# Patient Record
Sex: Male | Born: 1935 | Race: Black or African American | Hispanic: No | Marital: Married | Smoking: Former smoker
Health system: Southern US, Community
[De-identification: ages and names within clinical notes are randomized; demographics above are authoritative.]

## PROBLEM LIST (undated history)

## (undated) DIAGNOSIS — M545 Low back pain, unspecified: Secondary | ICD-10-CM

## (undated) DIAGNOSIS — R55 Syncope and collapse: Secondary | ICD-10-CM

## (undated) DIAGNOSIS — Z860100 Personal history of colon polyps, unspecified: Secondary | ICD-10-CM

## (undated) DIAGNOSIS — I350 Nonrheumatic aortic (valve) stenosis: Secondary | ICD-10-CM

## (undated) DIAGNOSIS — I739 Peripheral vascular disease, unspecified: Secondary | ICD-10-CM

## (undated) DIAGNOSIS — G629 Polyneuropathy, unspecified: Secondary | ICD-10-CM

## (undated) DIAGNOSIS — E875 Hyperkalemia: Secondary | ICD-10-CM

## (undated) DIAGNOSIS — G8929 Other chronic pain: Secondary | ICD-10-CM

## (undated) DIAGNOSIS — I509 Heart failure, unspecified: Secondary | ICD-10-CM

## (undated) DIAGNOSIS — L723 Sebaceous cyst: Secondary | ICD-10-CM

## (undated) DIAGNOSIS — I10 Essential (primary) hypertension: Secondary | ICD-10-CM

## (undated) DIAGNOSIS — N289 Disorder of kidney and ureter, unspecified: Secondary | ICD-10-CM

## (undated) DIAGNOSIS — I872 Venous insufficiency (chronic) (peripheral): Secondary | ICD-10-CM

## (undated) DIAGNOSIS — D649 Anemia, unspecified: Secondary | ICD-10-CM

## (undated) DIAGNOSIS — C61 Malignant neoplasm of prostate: Secondary | ICD-10-CM

## (undated) DIAGNOSIS — K5730 Diverticulosis of large intestine without perforation or abscess without bleeding: Secondary | ICD-10-CM

## (undated) DIAGNOSIS — Z87898 Personal history of other specified conditions: Secondary | ICD-10-CM

## (undated) HISTORY — DX: Peripheral vascular disease, unspecified: I73.9

## (undated) HISTORY — DX: Sebaceous cyst: L72.3

## (undated) HISTORY — DX: Malignant neoplasm of prostate: C61

## (undated) HISTORY — DX: Anemia, unspecified: D64.9

## (undated) HISTORY — PX: APPENDECTOMY: SHX54

## (undated) HISTORY — DX: Other chronic pain: G89.29

## (undated) HISTORY — DX: Heart failure, unspecified: I50.9

## (undated) HISTORY — DX: Disorder of kidney and ureter, unspecified: N28.9

## (undated) HISTORY — DX: Syncope and collapse: R55

## (undated) HISTORY — DX: Essential (primary) hypertension: I10

## (undated) HISTORY — DX: Nonrheumatic aortic (valve) stenosis: I35.0

## (undated) HISTORY — DX: Diverticulosis of large intestine without perforation or abscess without bleeding: K57.30

## (undated) HISTORY — DX: Low back pain: M54.5

## (undated) HISTORY — DX: Venous insufficiency (chronic) (peripheral): I87.2

## (undated) HISTORY — DX: Personal history of other specified conditions: Z87.898

## (undated) HISTORY — DX: Personal history of colon polyps, unspecified: Z86.0100

## (undated) HISTORY — DX: Personal history of colonic polyps: Z86.010

## (undated) HISTORY — DX: Low back pain, unspecified: M54.50

## (undated) HISTORY — DX: Polyneuropathy, unspecified: G62.9

---

## 1999-11-13 ENCOUNTER — Inpatient Hospital Stay (HOSPITAL_COMMUNITY): Admission: EM | Admit: 1999-11-13 | Discharge: 1999-11-15 | Payer: Self-pay | Admitting: Cardiology

## 1999-11-13 ENCOUNTER — Encounter: Payer: Self-pay | Admitting: Internal Medicine

## 1999-11-15 ENCOUNTER — Encounter: Payer: Self-pay | Admitting: Cardiology

## 2004-05-28 ENCOUNTER — Ambulatory Visit: Payer: Self-pay | Admitting: Pulmonary Disease

## 2004-12-05 ENCOUNTER — Ambulatory Visit: Payer: Self-pay | Admitting: Pulmonary Disease

## 2005-05-20 ENCOUNTER — Ambulatory Visit: Payer: Self-pay | Admitting: Pulmonary Disease

## 2005-05-21 ENCOUNTER — Ambulatory Visit: Payer: Self-pay | Admitting: Pulmonary Disease

## 2005-06-28 ENCOUNTER — Emergency Department (HOSPITAL_COMMUNITY): Admission: EM | Admit: 2005-06-28 | Discharge: 2005-06-28 | Payer: Self-pay | Admitting: Emergency Medicine

## 2005-07-15 ENCOUNTER — Ambulatory Visit: Payer: Self-pay | Admitting: Pulmonary Disease

## 2005-07-30 ENCOUNTER — Ambulatory Visit: Payer: Self-pay | Admitting: Pulmonary Disease

## 2005-11-17 ENCOUNTER — Ambulatory Visit: Payer: Self-pay | Admitting: Pulmonary Disease

## 2006-05-17 ENCOUNTER — Ambulatory Visit: Payer: Self-pay | Admitting: Pulmonary Disease

## 2006-05-18 ENCOUNTER — Ambulatory Visit: Payer: Self-pay | Admitting: Pulmonary Disease

## 2006-05-18 LAB — CONVERTED CEMR LAB
ALT: 15 units/L (ref 0–40)
AST: 19 units/L (ref 0–37)
Albumin: 3.6 g/dL (ref 3.5–5.2)
Alkaline Phosphatase: 61 units/L (ref 39–117)
BUN: 18 mg/dL (ref 6–23)
Basophils Absolute: 0 10*3/uL (ref 0.0–0.1)
Basophils Relative: 0.5 % (ref 0.0–1.0)
CO2: 31 meq/L (ref 19–32)
Calcium: 9.4 mg/dL (ref 8.4–10.5)
Chloride: 108 meq/L (ref 96–112)
Chol/HDL Ratio, serum: 3.1
Cholesterol: 171 mg/dL (ref 0–200)
Creatinine, Ser: 1.6 mg/dL — ABNORMAL HIGH (ref 0.4–1.5)
Eosinophil percent: 0.8 % (ref 0.0–5.0)
GFR calc non Af Amer: 44 mL/min
Glomerular Filtration Rate, Af Am: 53 mL/min/{1.73_m2}
Glucose, Bld: 115 mg/dL — ABNORMAL HIGH (ref 70–99)
HCT: 37.7 % — ABNORMAL LOW (ref 39.0–52.0)
HDL: 54.5 mg/dL (ref >39.0)
Hemoglobin: 12.5 g/dL — ABNORMAL LOW (ref 13.0–17.0)
Hgb A1c MFr Bld: 7.2 % — ABNORMAL HIGH (ref 4.6–6.0)
LDL Cholesterol: 109 mg/dL — ABNORMAL HIGH (ref 0–99)
Lymphocytes Relative: 24 % (ref 12.0–46.0)
MCHC: 33.1 g/dL (ref 30.0–36.0)
MCV: 85.6 fL (ref 78.0–100.0)
Monocytes Absolute: 0.5 10*3/uL (ref 0.2–0.7)
Monocytes Relative: 11.3 % — ABNORMAL HIGH (ref 3.0–11.0)
Neutro Abs: 3.1 10*3/uL (ref 1.4–7.7)
Neutrophils Relative %: 63.4 % (ref 43.0–77.0)
PSA: 1.9 ng/mL (ref 0.10–4.00)
Platelets: 232 10*3/uL (ref 150–400)
Potassium: 5 meq/L (ref 3.5–5.1)
RBC: 4.4 M/uL (ref 4.22–5.81)
RDW: 14.6 % (ref 11.5–14.6)
Sodium: 144 meq/L (ref 135–145)
TSH: 1.12 microintl units/mL (ref 0.35–5.50)
Total Bilirubin: 0.5 mg/dL (ref 0.3–1.2)
Total Protein: 7.1 g/dL (ref 6.0–8.3)
Triglyceride fasting, serum: 40 mg/dL (ref 0–149)
VLDL: 8 mg/dL (ref 0–40)
WBC: 4.7 10*3/uL (ref 4.5–10.5)

## 2006-11-09 ENCOUNTER — Ambulatory Visit: Payer: Self-pay | Admitting: Pulmonary Disease

## 2006-11-09 LAB — CONVERTED CEMR LAB
Albumin: 3.4 g/dL — ABNORMAL LOW (ref 3.5–5.2)
BUN: 18 mg/dL (ref 6–23)
Chloride: 108 meq/L (ref 96–112)
Creatinine, Ser: 1.4 mg/dL (ref 0.4–1.5)
GFR calc Af Amer: 62 mL/min
GFR calc non Af Amer: 51 mL/min
Hgb A1c MFr Bld: 7.4 % — ABNORMAL HIGH (ref 4.6–6.0)

## 2007-05-11 ENCOUNTER — Ambulatory Visit: Payer: Self-pay | Admitting: Pulmonary Disease

## 2007-05-11 LAB — CONVERTED CEMR LAB
ALT: 15 units/L (ref 0–53)
AST: 20 units/L (ref 0–37)
Albumin: 3.8 g/dL (ref 3.5–5.2)
Alkaline Phosphatase: 68 units/L (ref 39–117)
BUN: 25 mg/dL — ABNORMAL HIGH (ref 6–23)
Basophils Absolute: 0 10*3/uL (ref 0.0–0.1)
Basophils Relative: 0 % (ref 0.0–1.0)
CO2: 31 meq/L (ref 19–32)
Chloride: 105 meq/L (ref 96–112)
Creatinine, Ser: 1.7 mg/dL — ABNORMAL HIGH (ref 0.4–1.5)
HCT: 35.4 % — ABNORMAL LOW (ref 39.0–52.0)
Hemoglobin: 11.9 g/dL — ABNORMAL LOW (ref 13.0–17.0)
MCHC: 33.5 g/dL (ref 30.0–36.0)
Monocytes Absolute: 0.4 10*3/uL (ref 0.2–0.7)
Monocytes Relative: 8.6 % (ref 3.0–11.0)
Neutrophils Relative %: 66.1 % (ref 43.0–77.0)
Potassium: 5 meq/L (ref 3.5–5.1)
RBC: 4.17 M/uL — ABNORMAL LOW (ref 4.22–5.81)
RDW: 15.3 % — ABNORMAL HIGH (ref 11.5–14.6)
TSH: 0.89 microintl units/mL (ref 0.35–5.50)
Total Bilirubin: 0.6 mg/dL (ref 0.3–1.2)
Total Protein: 7 g/dL (ref 6.0–8.3)

## 2007-05-17 DIAGNOSIS — M545 Low back pain: Secondary | ICD-10-CM

## 2007-05-17 DIAGNOSIS — D510 Vitamin B12 deficiency anemia due to intrinsic factor deficiency: Secondary | ICD-10-CM

## 2007-05-17 DIAGNOSIS — I10 Essential (primary) hypertension: Secondary | ICD-10-CM | POA: Insufficient documentation

## 2007-05-17 DIAGNOSIS — Z8669 Personal history of other diseases of the nervous system and sense organs: Secondary | ICD-10-CM | POA: Insufficient documentation

## 2007-06-10 ENCOUNTER — Telehealth (INDEPENDENT_AMBULATORY_CARE_PROVIDER_SITE_OTHER): Payer: Self-pay | Admitting: *Deleted

## 2007-11-09 ENCOUNTER — Ambulatory Visit: Payer: Self-pay | Admitting: Pulmonary Disease

## 2007-11-09 DIAGNOSIS — I872 Venous insufficiency (chronic) (peripheral): Secondary | ICD-10-CM | POA: Insufficient documentation

## 2007-11-09 DIAGNOSIS — E118 Type 2 diabetes mellitus with unspecified complications: Secondary | ICD-10-CM

## 2007-11-09 DIAGNOSIS — I739 Peripheral vascular disease, unspecified: Secondary | ICD-10-CM

## 2007-11-09 DIAGNOSIS — G609 Hereditary and idiopathic neuropathy, unspecified: Secondary | ICD-10-CM

## 2007-11-09 DIAGNOSIS — C61 Malignant neoplasm of prostate: Secondary | ICD-10-CM

## 2007-11-09 DIAGNOSIS — K5730 Diverticulosis of large intestine without perforation or abscess without bleeding: Secondary | ICD-10-CM | POA: Insufficient documentation

## 2007-11-09 DIAGNOSIS — I359 Nonrheumatic aortic valve disorder, unspecified: Secondary | ICD-10-CM

## 2007-11-09 DIAGNOSIS — D126 Benign neoplasm of colon, unspecified: Secondary | ICD-10-CM | POA: Insufficient documentation

## 2007-11-14 LAB — CONVERTED CEMR LAB
Alkaline Phosphatase: 69 units/L (ref 39–117)
Basophils Absolute: 0 10*3/uL (ref 0.0–0.1)
Bilirubin, Direct: 0.1 mg/dL (ref 0.0–0.3)
Calcium: 9 mg/dL (ref 8.4–10.5)
Eosinophils Absolute: 0.1 10*3/uL (ref 0.0–0.7)
GFR calc Af Amer: 46 mL/min
GFR calc non Af Amer: 38 mL/min
Glucose, Bld: 142 mg/dL — ABNORMAL HIGH (ref 70–99)
HCT: 33.9 % — ABNORMAL LOW (ref 39.0–52.0)
Hemoglobin: 11.1 g/dL — ABNORMAL LOW (ref 13.0–17.0)
Hgb A1c MFr Bld: 7 % — ABNORMAL HIGH (ref 4.6–6.0)
MCHC: 32.6 g/dL (ref 30.0–36.0)
MCV: 86.2 fL (ref 78.0–100.0)
Microalb Creat Ratio: 82 mg/g — ABNORMAL HIGH (ref 0.0–30.0)
Microalb, Ur: 15.3 mg/dL — ABNORMAL HIGH (ref 0.0–1.9)
Monocytes Absolute: 0.4 10*3/uL (ref 0.1–1.0)
Monocytes Relative: 8.9 % (ref 3.0–12.0)
Neutro Abs: 3.3 10*3/uL (ref 1.4–7.7)
Platelets: 225 10*3/uL (ref 150–400)
Potassium: 5 meq/L (ref 3.5–5.1)
RDW: 14.7 % — ABNORMAL HIGH (ref 11.5–14.6)
Sodium: 144 meq/L (ref 135–145)
TSH: 0.89 microintl units/mL (ref 0.35–5.50)
Total Protein: 6.7 g/dL (ref 6.0–8.3)

## 2007-11-15 ENCOUNTER — Ambulatory Visit: Payer: Self-pay

## 2007-11-15 ENCOUNTER — Encounter: Payer: Self-pay | Admitting: Pulmonary Disease

## 2008-03-19 ENCOUNTER — Encounter: Payer: Self-pay | Admitting: Pulmonary Disease

## 2008-03-20 ENCOUNTER — Telehealth (INDEPENDENT_AMBULATORY_CARE_PROVIDER_SITE_OTHER): Payer: Self-pay | Admitting: *Deleted

## 2008-05-07 ENCOUNTER — Ambulatory Visit: Payer: Self-pay | Admitting: Pulmonary Disease

## 2008-05-19 DIAGNOSIS — N259 Disorder resulting from impaired renal tubular function, unspecified: Secondary | ICD-10-CM | POA: Insufficient documentation

## 2008-05-19 LAB — CONVERTED CEMR LAB
Alkaline Phosphatase: 68 units/L (ref 39–117)
Bilirubin, Direct: 0.1 mg/dL (ref 0.0–0.3)
Chloride: 107 meq/L (ref 96–112)
Eosinophils Absolute: 0 10*3/uL (ref 0.0–0.7)
GFR calc Af Amer: 41 mL/min
GFR calc non Af Amer: 34 mL/min
HCT: 32.4 % — ABNORMAL LOW (ref 39.0–52.0)
MCV: 86.2 fL (ref 78.0–100.0)
Monocytes Absolute: 0.4 10*3/uL (ref 0.1–1.0)
Monocytes Relative: 9.9 % (ref 3.0–12.0)
Neutrophils Relative %: 69.4 % (ref 43.0–77.0)
Platelets: 203 10*3/uL (ref 150–400)
Potassium: 4.9 meq/L (ref 3.5–5.1)
RDW: 15.5 % — ABNORMAL HIGH (ref 11.5–14.6)
Sodium: 144 meq/L (ref 135–145)

## 2008-08-30 ENCOUNTER — Telehealth (INDEPENDENT_AMBULATORY_CARE_PROVIDER_SITE_OTHER): Payer: Self-pay | Admitting: *Deleted

## 2008-09-04 ENCOUNTER — Telehealth (INDEPENDENT_AMBULATORY_CARE_PROVIDER_SITE_OTHER): Payer: Self-pay | Admitting: *Deleted

## 2008-09-28 ENCOUNTER — Telehealth (INDEPENDENT_AMBULATORY_CARE_PROVIDER_SITE_OTHER): Payer: Self-pay

## 2008-11-06 ENCOUNTER — Ambulatory Visit: Payer: Self-pay | Admitting: Pulmonary Disease

## 2008-11-07 ENCOUNTER — Telehealth (INDEPENDENT_AMBULATORY_CARE_PROVIDER_SITE_OTHER): Payer: Self-pay | Admitting: *Deleted

## 2008-11-14 DIAGNOSIS — K029 Dental caries, unspecified: Secondary | ICD-10-CM | POA: Insufficient documentation

## 2008-11-14 LAB — CONVERTED CEMR LAB
ALT: 14 units/L (ref 0–53)
BUN: 27 mg/dL — ABNORMAL HIGH (ref 6–23)
CO2: 30 meq/L (ref 19–32)
Chloride: 110 meq/L (ref 96–112)
Eosinophils Relative: 2.3 % (ref 0.0–5.0)
Glucose, Bld: 100 mg/dL — ABNORMAL HIGH (ref 70–99)
HCT: 32.7 % — ABNORMAL LOW (ref 39.0–52.0)
Hgb A1c MFr Bld: 6.7 % — ABNORMAL HIGH (ref 4.6–6.5)
Lymphs Abs: 1.1 10*3/uL (ref 0.7–4.0)
MCHC: 34.5 g/dL (ref 30.0–36.0)
MCV: 85.7 fL (ref 78.0–100.0)
Monocytes Absolute: 0.5 10*3/uL (ref 0.1–1.0)
Platelets: 186 10*3/uL (ref 150.0–400.0)
Potassium: 5.6 meq/L — ABNORMAL HIGH (ref 3.5–5.1)
RDW: 15.1 % — ABNORMAL HIGH (ref 11.5–14.6)
TSH: 1.05 microintl units/mL (ref 0.35–5.50)
Total Bilirubin: 0.9 mg/dL (ref 0.3–1.2)
WBC: 3.9 10*3/uL — ABNORMAL LOW (ref 4.5–10.5)

## 2008-11-19 ENCOUNTER — Telehealth: Payer: Self-pay | Admitting: Pulmonary Disease

## 2008-12-13 ENCOUNTER — Encounter: Payer: Self-pay | Admitting: Pulmonary Disease

## 2008-12-18 ENCOUNTER — Encounter: Payer: Self-pay | Admitting: Pulmonary Disease

## 2009-05-06 ENCOUNTER — Ambulatory Visit: Payer: Self-pay | Admitting: Pulmonary Disease

## 2009-05-06 LAB — CONVERTED CEMR LAB
BUN: 29 mg/dL — ABNORMAL HIGH (ref 6–23)
Creatinine, Ser: 1.5 mg/dL (ref 0.4–1.5)
Eosinophils Relative: 1.2 % (ref 0.0–5.0)
GFR calc non Af Amer: 56.46 mL/min (ref >60)
HCT: 32.2 % — ABNORMAL LOW (ref 39.0–52.0)
Hgb A1c MFr Bld: 6.3 % (ref 4.6–6.5)
Lymphocytes Relative: 23.3 % (ref 12.0–46.0)
Monocytes Relative: 10.2 % (ref 3.0–12.0)
Neutrophils Relative %: 61.9 % (ref 43.0–77.0)
Platelets: 231 10*3/uL (ref 150.0–400.0)
Potassium: 5.6 meq/L — ABNORMAL HIGH (ref 3.5–5.1)
WBC: 4.4 10*3/uL — ABNORMAL LOW (ref 4.5–10.5)

## 2009-06-18 ENCOUNTER — Ambulatory Visit: Payer: Self-pay | Admitting: Pulmonary Disease

## 2009-06-18 DIAGNOSIS — M79609 Pain in unspecified limb: Secondary | ICD-10-CM

## 2009-06-19 LAB — CONVERTED CEMR LAB: Uric Acid, Serum: 6.2 mg/dL (ref 4.0–7.8)

## 2009-06-25 ENCOUNTER — Telehealth (INDEPENDENT_AMBULATORY_CARE_PROVIDER_SITE_OTHER): Payer: Self-pay | Admitting: *Deleted

## 2009-06-28 ENCOUNTER — Telehealth (INDEPENDENT_AMBULATORY_CARE_PROVIDER_SITE_OTHER): Payer: Self-pay | Admitting: *Deleted

## 2009-07-04 ENCOUNTER — Ambulatory Visit: Payer: Self-pay | Admitting: Pulmonary Disease

## 2009-09-10 ENCOUNTER — Encounter: Payer: Self-pay | Admitting: Adult Health

## 2009-09-10 ENCOUNTER — Ambulatory Visit: Payer: Self-pay | Admitting: Pulmonary Disease

## 2009-09-24 ENCOUNTER — Ambulatory Visit: Payer: Self-pay | Admitting: Pulmonary Disease

## 2009-09-25 DIAGNOSIS — L97509 Non-pressure chronic ulcer of other part of unspecified foot with unspecified severity: Secondary | ICD-10-CM

## 2009-09-30 ENCOUNTER — Encounter (HOSPITAL_BASED_OUTPATIENT_CLINIC_OR_DEPARTMENT_OTHER)
Admission: RE | Admit: 2009-09-30 | Discharge: 2009-11-06 | Payer: Self-pay | Source: Home / Self Care | Admitting: General Surgery

## 2009-10-02 ENCOUNTER — Ambulatory Visit (HOSPITAL_COMMUNITY): Admission: RE | Admit: 2009-10-02 | Discharge: 2009-10-02 | Payer: Self-pay | Admitting: General Surgery

## 2009-10-29 ENCOUNTER — Encounter: Payer: Self-pay | Admitting: Pulmonary Disease

## 2009-11-06 ENCOUNTER — Ambulatory Visit: Payer: Self-pay | Admitting: Pulmonary Disease

## 2009-11-07 ENCOUNTER — Encounter: Payer: Self-pay | Admitting: Pulmonary Disease

## 2009-11-09 LAB — CONVERTED CEMR LAB
ALT: 13 units/L (ref 0–53)
AST: 17 units/L (ref 0–37)
Basophils Relative: 0.4 % (ref 0.0–3.0)
Bilirubin, Direct: 0.1 mg/dL (ref 0.0–0.3)
Chloride: 105 meq/L (ref 96–112)
Eosinophils Absolute: 0 10*3/uL (ref 0.0–0.7)
HCT: 31.1 % — ABNORMAL LOW (ref 39.0–52.0)
Hgb A1c MFr Bld: 6.8 % — ABNORMAL HIGH (ref 4.6–6.5)
Lymphs Abs: 0.9 10*3/uL (ref 0.7–4.0)
MCHC: 33.5 g/dL (ref 30.0–36.0)
MCV: 83.3 fL (ref 78.0–100.0)
Monocytes Absolute: 0.4 10*3/uL (ref 0.1–1.0)
Neutrophils Relative %: 63.6 % (ref 43.0–77.0)
Platelets: 214 10*3/uL (ref 150.0–400.0)
Potassium: 5.1 meq/L (ref 3.5–5.1)
RBC: 3.74 M/uL — ABNORMAL LOW (ref 4.22–5.81)
Sodium: 145 meq/L (ref 135–145)
Total Bilirubin: 0.6 mg/dL (ref 0.3–1.2)

## 2010-03-06 ENCOUNTER — Telehealth: Payer: Self-pay | Admitting: Pulmonary Disease

## 2010-03-31 ENCOUNTER — Ambulatory Visit: Payer: Self-pay | Admitting: Pulmonary Disease

## 2010-03-31 DIAGNOSIS — R634 Abnormal weight loss: Secondary | ICD-10-CM

## 2010-04-02 ENCOUNTER — Ambulatory Visit (HOSPITAL_COMMUNITY)
Admission: RE | Admit: 2010-04-02 | Discharge: 2010-04-02 | Payer: Self-pay | Source: Home / Self Care | Admitting: Pulmonary Disease

## 2010-04-05 LAB — CONVERTED CEMR LAB
Albumin: 3.5 g/dL (ref 3.5–5.2)
BUN: 26 mg/dL — ABNORMAL HIGH (ref 6–23)
Basophils Relative: 0.6 % (ref 0.0–3.0)
Bilirubin Urine: NEGATIVE
Bilirubin, Direct: 0.1 mg/dL (ref 0.0–0.3)
CEA: 1.2 ng/mL (ref 0.0–5.0)
CO2: 31 meq/L (ref 19–32)
Calcium: 9 mg/dL (ref 8.4–10.5)
Creatinine, Ser: 1.5 mg/dL (ref 0.4–1.5)
Eosinophils Relative: 1.1 % (ref 0.0–5.0)
Hemoglobin, Urine: NEGATIVE
Hemoglobin: 9.8 g/dL — ABNORMAL LOW (ref 13.0–17.0)
Hgb A1c MFr Bld: 7 % — ABNORMAL HIGH (ref 4.6–6.5)
Leukocytes, UA: NEGATIVE
Lymphocytes Relative: 18.8 % (ref 12.0–46.0)
MCHC: 33.2 g/dL (ref 30.0–36.0)
Monocytes Relative: 10.2 % (ref 3.0–12.0)
Neutro Abs: 3.7 10*3/uL (ref 1.4–7.7)
Neutrophils Relative %: 69.3 % (ref 43.0–77.0)
PSA: 1.9 ng/mL (ref 0.10–4.00)
RBC: 3.38 M/uL — ABNORMAL LOW (ref 4.22–5.81)
Sed Rate: 29 mm/hr — ABNORMAL HIGH (ref 0–22)
Total Protein: 6.3 g/dL (ref 6.0–8.3)
WBC: 5.3 10*3/uL (ref 4.5–10.5)

## 2010-04-08 ENCOUNTER — Encounter: Payer: Self-pay | Admitting: Pulmonary Disease

## 2010-04-10 ENCOUNTER — Ambulatory Visit: Payer: Self-pay | Admitting: Pulmonary Disease

## 2010-04-13 LAB — CONVERTED CEMR LAB
OCCULT 2: NEGATIVE
OCCULT 3: NEGATIVE

## 2010-07-30 ENCOUNTER — Ambulatory Visit
Admission: RE | Admit: 2010-07-30 | Discharge: 2010-07-30 | Payer: Self-pay | Source: Home / Self Care | Attending: Pulmonary Disease | Admitting: Pulmonary Disease

## 2010-08-12 NOTE — Assessment & Plan Note (Signed)
Summary: Shane [name]   CC:  4 month ROV & review of mult medical problems....  History of Present Illness: 77 y/o BM here for a follow up visit... he has mult med problems as noted below...   ~  Apr10:  he has no new complaints or concerns... he still exercises by picking up old bottles and cans in the neighborhood "for gas money"...    ~  May 06, 2009:  he's had a good 6 months- no new problems... BP well controlled, no CP/ palpit/ dizzy/ etc... watches his BS and it's been doing well w/ range 100-140... he saw DrPeterson for f/u prostate ca about 3-4 months ago & stable by his report... OK Flu shot today...   ~  July 04, 2009:  he developed a blister on the dorsum of his left foot- ?no known trauma, ?pressure from shoe... treated w/ Keflex & topical Rx but remained red/ swollen... given Doxy x10d and improved... in the meanwhile he saw DrDuda, Ortho w/ nails trimmed and using "bag balm" for dry skin...       *** exam shows tinea pedis & clean eschar on dorsum- we will Rx w/ LotriminAF cream...   ~  November 06, 2009:  he went to wound care clinic and lesion on top of left foot resolved... still has some tinea pedis & we reviewed the treatment & refilled the LotriminAF cream...  BS has been well controlled on Metformin;  BP stable on meds and mild renal insuffic w/o change;  same of ACD...  only c/o some constipation & we discussed Rx w/ MIRALAX + SENAKOT-S...    Current Problem List:  DENTAL CARIES (ICD-521.00) - this needs attention from his dentist & he promises to see him soon...   HYPERTENSION (ICD-401.9) - on PROCARDIA XL 30mg /d & COZAAR 25mg /d... BP= 134/72, feeling well and tol meds well too... denies HA, fatigue, visual changes, CP, palipit, dizziness, syncope, dyspnea, edema, etc...  ~  cath 1996 w. norm coronaries... & Cardiolite 2001 was neg...  ~  CXR 4/10 showed norm heart, clear lungs...  AORTIC STENOSIS (ICD-424.1) - on above meds + IMDUR 30mg /d... denies CP, palpit, dizzy  or syncope (he was hosp in 2001 for syncope)... he walks regularly and picks up cans for recycle "It's bread and gas money"... he knows to stay well hydrated.  ~  2DEcho 4/05 showed mod Ao sclerosis & mild Ao root dil, mod asymmetric LVH...   ~  2DEcho 5/09 showed mod incr LVwall thickness w/ focal basal septal hypertropy & end-sys mid cavity oblit, mod calcif AoV c/w mild AS, mild asc Ao dil...  PERIPHERAL VASCULAR DISEASE (ICD-443.9) - on ASA 81mg /d... ArtDopplers of LE's in 2001 w/ calcif vessels and decr toe pressures but norm ABI's... he knows to avoid trauma, inspect feet regularly & watch for pressure sores etc... he sees DrDuda for foot care.  ~  3/11:  sm lesion dorsum left foot> sent to wound care clinic & slowly resolved...  VENOUS INSUFFICIENCY (ICD-459.81) - no edema...  DIABETES MELLITUS (ICD-250.00) - on METFORMIN 500mg Bid...  ~  labs 10/08 showed BS=107, HgA1c=6.8.[name]. note BUN=25, Creat=1.7.Marland KitchenMarland Kitchen no DM retinopathy per ophthal.  ~  labs 4/09 showed BS= 142, HgA1c= 7.0.[name]. he didn't want to add more meds.  ~  labs 10/09 showed BS= 118, HgA1c= 6.4  ~  labs 4/10 showed BS= 100, HgA1c= 6.7  ~  labs 10/10 showed BS= 121, A1c= 6.3  ~  labs 4/11 showed BS= 95, A1c= 6.8  DIVERTICULOSIS  OF COLON (ICD-562.10) - he notes occas constip & we discussed Miralax/ Senakot-S. COLONIC POLYPS (ICD-211.3) - last colonoscopy 9/99 by DrPatterson was neg without recurrent colon polyps, mild divertics only...  RENAL INSUFFICIENCY (ICD-588.9) - he has mild renal insuffic w/ BUN= 25, Creat= 1.7 on labs 10/08.  ~  labs 4/09 showed BUN= 23, Creat= 1.8  ~  labs 10/09 showed BUN= 30, Creat= 2.0  ~  labs 4/10 showed BUN= 27, Creat= 1.6  ~  labs 10/10 showed BUN= 29, Creat= 1.5  ~  labs 4/11 showed BUN= 20, Creat= 1.7  ADENOCARCINOMA, PROSTATE (ICD-185) - on FLOMAX 0.4mg /d from DrPeterson...   ~  10/10:  pt reports being seen by DrPeterson 3-4 mo ago & doing well by his report.  Hx of LOW BACK PAIN, CHRONIC  (ICD-724.2) - s/p eval from DrDuda for ortho...  SYNCOPE, HX OF (ICD-V12.49)  PERIPHERAL NEUROPATHY (ICD-356.9) - on NEURONTIN 300mg Bid...  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  Past History:  Past Medical History: DENTAL CARIES (ICD-521.00) HYPERTENSION (ICD-401.9) AORTIC STENOSIS (ICD-424.1) PERIPHERAL VASCULAR DISEASE (ICD-443.9) VENOUS INSUFFICIENCY (ICD-459.81) DIABETES MELLITUS (ICD-250.00) DIVERTICULOSIS OF COLON (ICD-562.10) COLONIC POLYPS (ICD-211.3) RENAL INSUFFICIENCY (ICD-588.9) ADENOCARCINOMA, PROSTATE (ICD-185) Hx of LOW BACK PAIN, CHRONIC (ICD-724.2) SYNCOPE, HX OF (ICD-V12.49) PERIPHERAL NEUROPATHY (ICD-356.9) SEBACEOUS CYST (ICD-706.2) ANEMIA (ICD-285.9)  Family History: Reviewed history from 06/18/2009 and no changes required. heart disease - mother otherwise noncontributory  Social History: Reviewed history from 06/18/2009 and no changes required. quit smoking cigars in 1980's x2-70yrs. noi alcohol married 1 children worked for [employer].  Review of Systems      See HPI       The patient complains of decreased hearing, dyspnea on exertion, and difficulty walking.  The patient denies anorexia, fever, weight loss, weight gain, vision loss, hoarseness, chest pain, syncope, peripheral edema, prolonged cough, headaches, hemoptysis, abdominal pain, melena, hematochezia, severe indigestion/heartburn, hematuria, incontinence, muscle weakness, suspicious skin lesions, transient blindness, depression, unusual weight change,  abnormal bleeding, enlarged lymph nodes, and angioedema.    Vital Signs:  Patient profile:   77 year old male Height:      67 inches Weight:      136 pounds O2 Sat:      100 % on Room air Temp:     98.7 degrees F oral Pulse rate:   57 / minute BP sitting:   134 / 72  (left arm) Cuff size:   regular  Vitals Entered By: Randell Loop CMA (November 06, 2009 2:32 PM)  O2 Sat at Rest %:  100 O2 Flow:  Room air CC: 4 month ROV & review of mult medical problems... Is Patient Diabetic? Yes Pain Assessment Patient in pain? no      Comments no changes in meds today--pt brought all of his meds today   Physical Exam  Additional Exam:  WD, WN, 77 y/o BM in NAD... GENERAL:  Alert & oriented; pleasant & cooperative. HEENT:  [place]/AT, EOM-wnl, PERRLA, EACs-clear, TMs-wnl, NOSE-clear, THROAT-clear & wnl; severe  dental caries... NECK:  Supple w/ decrROM; no JVD; normal carotid impulses w/o bruits; no thyromegaly or nodules palpated; no lymphadenopathy. CHEST:  Clear to P & A; without wheezes/ rales/ or rhonchi. HEART:  Regular Rhythm;  gr 2/6 SEM without rubs or gallops heard... ABDOMEN:  Soft & nontender; normal bowel sounds; no organomegaly or masses detected. EXT: without deformities, mod arthritic changes (esp neck) no varicose veins/ +venous insuffic/ no edema. Scar on dorsum left foot, no open area, no drainage, etc... NEURO:  CN's intact;  no focal neuro deficits... touch sensation intact in feet. DERM:  Tinea pedis + dry skin LE's...    MISC. Report  Procedure date:  11/06/2009  Findings:      BMP (METABOL)   Sodium                    145 mEq/L                   135-145   Potassium                 5.1 mEq/L                   3.5-5.1   Chloride                  105 mEq/L                   96-112   Carbon Dioxide       [H]  33 mEq/L                    19-32   Glucose                   95 mg/dL                    78-46   BUN                       20 mg/dL                    9-62    Creatinine           [H]  1.7 mg/dL                   9.5-2.8   Calcium                   9.1 mg/dL                   4.1-32.4   GFR                       48.81 mL/min                >60   Hepatic/Liver Function Panel (HEPATIC)   Total Bilirubin           0.6 mg/dL                   4.0-1.0   Direct Bilirubin          0.1 mg/dL                   2.7-2.5   Alkaline Phosphatase      78 U/L  39-117   AST                       17 U/L                      0-37   ALT                       13 U/L                      0-53   Total Protein             6.2 g/dL                    1.6-1.0   Albumin                   3.5 g/dL                    9.6-0.4   CBC Platelet w/Diff (CBCD)   White Cell Count     [L]  3.7 K/uL                    4.5-10.5   Red Cell Count       [L]  3.74 Mil/uL                 4.22-5.81   Hemoglobin           [L]  10.4 g/dL                   54.0-98.1   Hematocrit           [L]  31.1 %                      39.0-52.0   MCV                       83.3 fl                     78.0-100.0   Platelet Count            214.0 K/uL                  150.0-400.0   Neutrophil %              63.6 %                      43.0-77.0   Lymphocyte %              24.1 %                      12.0-46.0   Monocyte %                11.0 %                      3.0-12.0   Eosinophils%              0.9 %                       0.0-5.0   Basophils %               0.4 %  0.0-3.0  Comments:      TSH (TSH)   FastTSH                   0.99 uIU/mL                 0.35-5.50  Tests: (5) Hemoglobin A1C (A1C)   Hemoglobin A1C       [H]  6.8 %                       4.6-6.5   Impression & Recommendations:  Problem # 1:  FOOT ULCER (ICD-707.15) Resolved... he has tinea pedis & Rx w/ LotriminAF cream + teatment program outline for pt... He sees DrDuda for foot care...  Problem # 2:  HYPERTENSION (ICD-401.9) Controlled-  same meds. His updated medication list for this  problem includes:    Nifedical Xl 30 Mg Xr24h-tab (Nifedipine) .[name]... Take 1 tablet by mouth once a day    Cozaar 25 Mg Tabs (Losartan potassium) .[name]... Take one tab by mouth once daily  Orders: TLB-BMP (Basic Metabolic Panel-BMET) (80048-METABOL) TLB-Hepatic/Liver Function Pnl (80076-HEPATIC) TLB-CBC Platelet - w/Differential (85025-CBCD) TLB-TSH (Thyroid Stimulating Hormone) (84443-TSH) TLB-A1C / Hgb A1C (Glycohemoglobin) (83036-A1C)  Problem # 3:  AORTIC STENOSIS (ICD-424.1) Stable-  he is asymptomatic & not progressive clinically... His updated medication list for this problem includes:    Aspirin Adult Low Strength 81 Mg Tbec (Aspirin) .[name]... Take 1 tab by mouth once daily...  Problem # 4:  PERIPHERAL VASCULAR DISEASE (ICD-443.9) Aware-  we discussed hygiene for feet etc... continue ASA.  Problem # 5:  DIABETES MELLITUS (ICD-250.00) Control appears reasonable on meds... His updated medication list for this problem includes:    Aspirin Adult Low Strength 81 Mg Tbec (Aspirin) .[name]... Take 1 tab by mouth once daily...    Cozaar 25 Mg Tabs (Losartan potassium) .[name]... Take one tab by mouth once daily    Metformin Hcl 500 Mg Tb24 (Metformin hcl) .[name]... Take one tab by mouth two times a day  Problem # 6:  COLONIC POLYPS (ICD-211.3) GI is stable...  Problem # 7:  RENAL INSUFFICIENCY (ICD-588.9) He has mild stable renal insuffic...  Problem # 8:  ADENOCARCINOMA, PROSTATE (ICD-185) Followed by DrPeterson et al...  Problem # 9:  PERIPHERAL NEUROPATHY (ICD-356.9) Aware-  he states the Neurontin helps...  Problem # 10:  OTHER MEDICAL PROBLEMS AS NOTED>>>  Complete Medication List: 1)  Aspirin Adult Low Strength 81 Mg Tbec (Aspirin) .... Take 1 tab by mouth once daily.Marland KitchenMarland Kitchen 2)  Isosorbide Mononitrate Cr 30 Mg Xr24h-tab (Isosorbide mononitrate) .... Take one tablet by mouth once daily 3)  Nifedical Xl 30 Mg Xr24h-tab (Nifedipine) .... Take 1 tablet by mouth once a day 4)  Cozaar 25 Mg Tabs  (Losartan potassium) .... Take one tab by mouth once daily 5)  Metformin Hcl 500 Mg Tb24 (Metformin hcl) .... Take one tab by mouth two times a day 6)  Flomax 0.4 Mg Cp24 (Tamsulosin hcl) .... Take 1 tab by mouth at bedtime.Marland KitchenMarland Kitchen 7)  Gabapentin 300 Mg Caps (Gabapentin) .... Take one tab by mouth two times a day 8)  Multivitamins Tabs (Multiple vitamin) .... Take 1 tab by mouth once daily.Marland KitchenMarland Kitchen 9)  Tramadol Hcl 50 Mg Tabs (Tramadol hcl) .[name].. 1 three times a day as needed for pain 10)  Lotrimin Af 1 % Crea (Clotrimazole) .... Apply to feet as directed... 11)  Nitrostat 0.4 Mg Subl (Nitroglycerin) .... Use as directed 12)  Accu-chek  Aviva Strp (Glucose blood) .... Check blood sugar once daily  Other Orders: Prescription Created Electronically [phone])  Patient Instructions: 1)  Today we updated your med list- see below.... 2)  We refilled your meds for 2011... 3)  For your CONSTIPATION:  start the OTC MIRALAX- 1 capfull of water daily, and use the OTC SENAKOT-S 1-2 tabs at bedtime.Marland KitchenMarland Kitchen 4)  Today we did your follow up blood work... please call the "phone tree" in a few days for your lab results.Marland KitchenMarland Kitchen 5)  Remember to keep your feet clean and dry as we discussed... 6)  Call for any questions.Marland KitchenMarland Kitchen 7)  Please schedule a follow-up appointment in 4-6 months. Prescriptions: NITROSTAT 0.4 MG SUBL (NITROGLYCERIN) Use as directed  #25 x prn   Entered and Authorized by:   Michele Mcalpine MD   Signed by:   Michele Mcalpine MD on 11/06/2009   Method used:   Print then Give to Patient   RxID:   [id number] LOTRIMIN AF 1 % CREA (CLOTRIMAZOLE) apply to feet as directed...  #1 tube x prn   Entered and Authorized by:   Michele Mcalpine MD   Signed by:   Michele Mcalpine MD on 11/06/2009   Method used:   Print then Give to Patient   RxID:   [id number] TRAMADOL HCL 50 MG TABS (TRAMADOL HCL) 1 three times a day as needed for pain  #90 x prn   Entered and Authorized by:   Michele Mcalpine MD   Signed by:   Michele Mcalpine MD  on 11/06/2009   Method used:   Print then Give to Patient   RxID:   [id number] GABAPENTIN 300 MG  CAPS (GABAPENTIN) take one tab by mouth two times a day  #60 x prn   Entered and Authorized by:   Michele Mcalpine MD   Signed by:   Michele Mcalpine MD on 11/06/2009   Method used:   Print then Give to Patient   RxID:   [id number] FLOMAX 0.4 MG  CP24 (TAMSULOSIN HCL) take 1 tab by mouth at bedtime...  #30 x prn   Entered and Authorized by:   Michele Mcalpine MD   Signed by:   Michele Mcalpine MD on 11/06/2009   Method used:   Print then Give to Patient   RxID:   [id number] METFORMIN HCL 500 MG  TB24 (METFORMIN HCL) take one tab by mouth two times a day  #60 x prn   Entered and Authorized by:   Michele Mcalpine MD   Signed by:   Michele Mcalpine MD on 11/06/2009   Method used:   Print then Give to Patient   RxID:   [id number] COZAAR 25 MG  TABS (LOSARTAN POTASSIUM) take one tab by mouth once daily  #30 x prn   Entered and Authorized by:   Michele Mcalpine MD   Signed by:   Michele Mcalpine MD on 11/06/2009   Method used:   Print then Give to Patient   RxID:   [id number] NIFEDICAL XL 30 MG XR24H-TAB (NIFEDIPINE) Take 1 tablet by mouth once a day  #30 x prn   Entered and Authorized by:   Michele Mcalpine MD   Signed by:   Michele Mcalpine MD on 11/06/2009   Method used:   Print then Give to Patient   RxID:   [id number] ISOSORBIDE MONONITRATE CR 30 MG XR24H-TAB (ISOSORBIDE MONONITRATE) take one tablet by mouth once  daily  #30 x prn   Entered and Authorized by:   Michele Mcalpine MD   Signed by:   Michele Mcalpine MD on 11/06/2009   Method used:   Print then Give to Patient   RxID:   [id number]

## 2010-08-12 NOTE — Assessment & Plan Note (Signed)
Summary: NP folloe up - left ankle ulcer   CC:  2 week follow up for left ankle ulcer.  states is doing well today.[name]  History of Present Illness: 77 y/o BM with known hx of HTN, GERD, DM    ~  Apr10:  he has no new complaints or concerns... he still exercises by picking up old bottles and cans in the neighborhood "for gas money"...    ~  May 06, 2009:  he's had a good 6 months- no new problems.Marland KitchenMarland KitchenBP well controlled, no CP/ palpit/ dizzy/ etc... watches his BS and it's been doing well w/ range 100-140... he saw DrPeterson for f/u prostate ca about 3-4 months ago & stable by his report... OK Flu shot today...   June 18, 2009 --Presents for an acute office visit. Complains of increased edema in left ankle x1-2weeks with pain. No known injury.  Has been red and tender . no fever.     ~  July 04, 2009:  he developed a blister on the dorsum of his left foot- ?no known trauma, ?pressure from shoe... treated w/ Keklex & topical Rx but remained red/ swollen... given Doxy x10d and improved... in the meanwhile he saw DrDuda, Ortho w/ nails trimmed and using "bag balm" for dry skin...       *** exam shows tinea pedis & clean eschar on dorsum- we will Rx w/ LotriminAF cream...   September 10, 2009 --Presents for follow up of foot sore. pt would like his left foot checked "to see if it's healing fast enough."  states he feels it has improved since last OV, but still having some dark red drainage. Has had small sore that healed along left ankle but now small circular sore along top of left foot that is improved but has scant drainage on bandage daily. Denies chest pain, dyspnea, orthopnea, hemoptysis, fever, n/v/d, edema, headache, known injury, red streaks.   September 24, 2009 --Returns for 2 week follow up for left foot ulcer.  Last visit w/ small ulcer on dorsal aspect of left foot, appeared secondary from pressure from shoe. He was tx w/ doxcycline x 7 days, Wound cx was neg. Area is slightly smaller  today w/ no redness or drainage. He is cleaning and dressing daily. This has been present since 06/2009. He does have DM. Denies chest pain, dyspnea, orthopnea, hemoptysis, fever, n/v/d.   Medications Prior to Update: 1)  Aspirin Adult Low Strength 81 Mg Tbec (Aspirin) .... Take 1 Tab By Mouth Once Daily.Marland KitchenMarland Kitchen 2)  Isosorbide Mononitrate Cr 30 Mg Xr24h-Tab (Isosorbide Mononitrate) .... Take One Tablet By Mouth Once Daily 3)  Cozaar 25 Mg  Tabs (Losartan Potassium) .... Take One Tab By Mouth Once Daily 4)  Metformin Hcl 500 Mg  Tb24 (Metformin Hcl) .... Take One Tab By Mouth Two Times A Day 5)  Flomax 0.4 Mg  Cp24 (Tamsulosin Hcl) .... Take 1 Tab By Mouth At Bedtime.Marland KitchenMarland Kitchen 6)  Gabapentin 300 Mg  Caps (Gabapentin) .... Take One Tab By Mouth Two Times A Day 7)  Multivitamins  Tabs (Multiple Vitamin) .... Take 1 Tab By Mouth Once Daily.Marland KitchenMarland Kitchen 8)  Tramadol Hcl 50 Mg Tabs (Tramadol Hcl) .[name].. 1 Three Times A Day As Needed For Pain 9)  Lotrimin Af 1 % Crea (Clotrimazole) .... Apply To Feet As Directed... 10)  Nitrostat 0.4 Mg Subl (Nitroglycerin) .... Use As Directed 11)  Doxycycline Hyclate 100 Mg Caps (Doxycycline Hyclate) .[name].. 1 By Mouth Two Times A Day 12)  Accu-Chek Aviva  Strp (Glucose Blood) .... Check Blood Sugar Once Daily  Current Medications (verified): 1)  Aspirin Adult Low Strength 81 Mg Tbec (Aspirin) .... Take 1 Tab By Mouth Once Daily.Marland KitchenMarland Kitchen 2)  Nitrostat 0.4 Mg Subl (Nitroglycerin) .... Use As Directed 3)  Isosorbide Mononitrate Cr 30 Mg Xr24h-Tab (Isosorbide Mononitrate) .... Take One Tablet By Mouth Once Daily 4)  Cozaar 25 Mg  Tabs (Losartan Potassium) .... Take One Tab By Mouth Once Daily 5)  Metformin Hcl 500 Mg  Tb24 (Metformin Hcl) .... Take One Tab By Mouth Two Times A Day 6)  Flomax 0.4 Mg  Cp24 (Tamsulosin Hcl) .... Take 1 Tab By Mouth At Bedtime.Marland KitchenMarland Kitchen 7)  Gabapentin 300 Mg  Caps (Gabapentin) .... Take One Tab By Mouth Two Times A Day 8)  Multivitamins  Tabs (Multiple Vitamin) .... Take 1 Tab  By Mouth Once Daily.Marland KitchenMarland Kitchen 9)  Accu-Chek Aviva  Strp (Glucose Blood) .... Check Blood Sugar Once Daily 10)  Tramadol Hcl 50 Mg Tabs (Tramadol Hcl) .[name].. 1 Three Times A Day As Needed For Pain 11)  Lotrimin Af 1 % Crea (Clotrimazole) .... Apply To Feet As Directed... 12)  Nifedical Xl 30 Mg Xr24h-Tab (Nifedipine) .... Take 1 Tablet By Mouth Once A Day  Allergies (verified): No Known Drug Allergies  Past History:  Past Medical History: Last updated: 07/04/2009 TINEA PEDIS (ICD-110.4) FOOT PAIN (ICD-729.5)  DENTAL CARIES (ICD-521.00) HYPERTENSION (ICD-401.9) AORTIC STENOSIS (ICD-424.1) PERIPHERAL VASCULAR DISEASE (ICD-443.9) VENOUS INSUFFICIENCY (ICD-459.81) DIABETES MELLITUS (ICD-250.00) DIVERTICULOSIS OF COLON (ICD-562.10) COLONIC POLYPS (ICD-211.3) RENAL INSUFFICIENCY (ICD-588.9) ADENOCARCINOMA, PROSTATE (ICD-185) Hx of LOW BACK PAIN, CHRONIC (ICD-724.2) SYNCOPE, HX OF (ICD-V12.49) PERIPHERAL NEUROPATHY (ICD-356.9) SEBACEOUS CYST (ICD-706.2) ANEMIA (ICD-285.9)  Past Surgical History: Last updated: 06/18/2009 PMH cont   DIABETES MELLITUS (ICD-250.00) - on METFORMIN 500mg Bid...  ~  labs 10/08 showed BS=107, HgA1c=6.8.[name]. note BUN=25, Creat=1.7.Marland KitchenMarland Kitchen no DM retinopathy per ophtal.  ~  labs 4/09 showed BS= 142, HgA1c= 7.0.[name]. he didn't want to add more meds.  ~  labs 10/09 showed BS= 118, HgA1c= 6.4  ~  labs 4/10 showed BS= 100, HgA1c= 6.7  ~  labs 10/10 showed BS= 121, A1c= 6.3  DIVERTICULOSIS OF COLON (ICD-562.10) - he notes occas constip & we discussed Miralax/ Senakot-S. COLONIC POLYPS (ICD-211.3) - last colonoscopy 9/99 by DrPatterson was neg without recurrent colon polyps, mild divertics only...  RENAL INSUFFICIENCY (ICD-588.9) - he has mild renal insuffic w/ BUN= 25, Creat= 1.7 on labs 10/08.  ~  labs 4/09 showed BUN= 23, Creat= 1.8  ~  labs 10/09 showed BUN= 30, Creat= 2.0  ~  labs 4/10 showed BUN= 27, Creat= 1.6  ~  labs 10/10 showed BUN= 29, Creat= 1.5  ADENOCARCINOMA,  PROSTATE (ICD-185) - on FLOMAX 0.4mg /d from DrPeterson...   ~  10/10:  pt reports being seen by DrPeterson 3-4 mo ago & doing well by his report.  Hx of LOW BACK PAIN, CHRONIC (ICD-724.2) - s/p eval from DrDuda for ortho...  SYNCOPE, HX OF (ICD-V12.49)  PERIPHERAL NEUROPATHY (ICD-356.9) - on NEURONTIN 300mg Bid...  SEBACEOUS CYST (ICD-706.2)  ANEMIA (ICD-285.9) -   ~  labs 2008 showed Hg= 11.9  ~  labs 4/09 showed Hg= 11.1  ~  labs 10/09 showed Hg= 10.7  ~  labs 4/10 showed Hg= 11.3  ~  labs 10/10 showed Hg= 10.8, MCV= 8  Family History: Last updated: 06/18/2009 heart disease - mother otherwise noncontributory  Social History: Last updated: 06/18/2009 quit smoking cigars in 1980's x2-67yrs. noi alcohol married 1  children worked for [employer].  Risk Factors: Smoking Status: quit (05/07/2008)  Review of Systems      See HPI  Vital Signs:  Patient profile:   77 year old male Height:      67 inches Weight:      141.13 pounds O2 Sat:      98 % on Room air Temp:     97.3 degrees F oral Pulse rate:   60 / minute BP sitting:   140 / 72  (right arm) Cuff size:   regular  Vitals Entered By: Boone Master CNA (September 24, 2009 3:29 PM)  O2 Flow:  Room air CC: 2 week follow up for left ankle ulcer.  states is doing well today. Is Patient Diabetic? Yes Comments Medications reviewed with patient Daytime contact number verified with patient. Boone Master CNA  September 24, 2009 3:30 PM    Physical Exam  Additional Exam:  WD, WN, 77 y/o BM in NAD... GENERAL:  Alert & oriented; pleasant & cooperative. HEENT:  [place]/AT,  EACs-clear, TMs-wnl, NOSE-clear, THROAT-clear & wnl; severe dental caries... NECK:  Supple w/ decrROM; no JVD; normal carotid impulses w/o bruits; no thyromegaly or nodules palpated; no lymphadenopathy. CHEST:  Clear to P & A; without wheezes/ rales/ or rhonchi. HEART:  Regular Rhythm;  gr 2/6 SEM without rubs or gallops heard... ABDOMEN:  Soft & nontender;  normal bowel sounds; no organomegaly or masses detected. EXT: without deformities, mod arthritic changes (esp neck) no varicose veins/ +venous insuffic/ no edema. see derm for foot exam NEURO:  CN's intact;  no focal neuro deficits... DERM:  along dorsal aspect of left foot, small 0.5cm circular ulcer w/ pink base , no sign. redness., improved w/ slightly smaller area and healed edges.     Impression & Recommendations:  Problem # 1:  FOOT ULCER (ICD-707.15) Slightly improved foot ulcer however very slow to heal.  REC:  Wash with soap and water , pat dry and cover w/ neosporin bandange Elevate foot as needed  We are referring you to wound center.  follow up Dr. Kriste Basque in 6 weeks as scheduled.  Please contact office for sooner follow up if symptoms do not improve or worsen   Orders: Est. Patient Level III ([address]109)  Medications Added to Medication List This Visit: 1)  Nifedical Xl 30 Mg Xr24h-tab (Nifedipine) .... Take 1 tablet by mouth once a day  Complete Medication List: 1)  Aspirin Adult Low Strength 81 Mg Tbec (Aspirin) .... Take 1 tab by mouth once daily.Marland KitchenMarland Kitchen 2)  Isosorbide Mononitrate Cr 30 Mg Xr24h-tab (Isosorbide mononitrate) .... Take one tablet by mouth once daily 3)  Nifedical Xl 30 Mg Xr24h-tab (Nifedipine) .... Take 1 tablet by mouth once a day 4)  Cozaar 25 Mg Tabs (Losartan potassium) .... Take one tab by mouth once daily 5)  Metformin Hcl 500 Mg Tb24 (Metformin hcl) .... Take one tab by mouth two times a day 6)  Flomax 0.4 Mg Cp24 (Tamsulosin hcl) .... Take 1 tab by mouth at bedtime.Marland KitchenMarland Kitchen 7)  Gabapentin 300 Mg Caps (Gabapentin) .... Take one tab by mouth two times a day 8)  Multivitamins Tabs (Multiple vitamin) .... Take 1 tab by mouth once daily.Marland KitchenMarland Kitchen 9)  Tramadol Hcl 50 Mg Tabs (Tramadol hcl) .[name].. 1 three times a day as needed for pain 10)  Lotrimin Af 1 % Crea (Clotrimazole) .... Apply to feet as directed... 11)  Nitrostat 0.4 Mg Subl (Nitroglycerin) .... Use as directed 12)   Accu-chek Aviva  Strp (Glucose blood) .... Check blood sugar once daily  Other Orders: Wound Care Center Referral (Wound Care)  Patient Instructions: 1)  Wash with soap and water , pat dry and cover w/ neosporin bandange 2)  Elevate foot as needed  3)  We are referring you to wound center.  4)  follow up Dr. Kriste Basque in 6 weeks as scheduled.  5)  Please contact office for sooner follow up if symptoms do not improve or worsen

## 2010-08-12 NOTE — Progress Notes (Signed)
Summary: rx  Phone Note Call from Patient Call back at Home Phone [phone]   Caller: Spouse-Mozelle Call For: nadel Reason for Call: Talk to Nurse Summary of Call: pt is constipated, been using an enema, but pt only goes a little, please advise. Sharl Ma - [employer].    Initial call taken by: Eugene Gavia,  March 06, 2010 2:04 PM  Follow-up for Phone Call        [email or web address] Randell Loop [facility]  March 06, 2010 2:23 PM     pt called back---he stated that he has tried a stool softner with no help at all---per SN---try the mag citrate--1 bottle followed with lots of water and take senna s   4 tab prior to bedtime---in the am if needed use the dulcolax supp.  if this does not work then repeat in the afternoon with the same steps.  pts wife had a hard time understanding directions and meds so i called the [facility] pharmacist and they are going to help them when [facility] to get the correct meds and the right directions Randell Loop CMA  March 06, 2010 5:27 PM

## 2010-08-12 NOTE — Consult Note (Signed)
Summary: [facility]  [facility]   Imported By: Sherian Rein 11/20/2009 09:49:48  _____________________________________________________________________  External Attachment:    Type:   Image     Comment:   External Document

## 2010-08-12 NOTE — Assessment & Plan Note (Signed)
Summary: 4-5 MONTH RETURN/[facility]   CC:  4-5 month ROV & review of mult medical problems....  History of Present Illness: 77 y/o BM here for a follow up visit... he has mult med problems as noted below...   ~  Apr10:  he has no new complaints or concerns... he still exercises by picking up old bottles and cans in the neighborhood "for gas money"...    ~  Oct10:  he's had a good 6 months- no new problems... BP well controlled, no CP/ palpit/ dizzy/ etc... watches his BS and it's been doing well w/ range 100-140... he saw DrPeterson for f/u prostate ca about 3-4 months ago & stable by his report... OK Flu shot today...  ~  Dec10:  he developed a blister on the dorsum of his left foot- ?no known trauma, ?pressure from shoe... treated w/ Keflex & topical Rx but remained red/ swollen... given Doxy x10d and improved... in the meanwhile he saw DrDuda, Ortho w/ nails trimmed and using "bag balm" for dry skin...       *** exam shows tinea pedis & clean eschar on dorsum- we will Rx w/ LotriminAF cream...   ~  November 06, 2009:  he went to wound care clinic and lesion on top of left foot resolved... still has some tinea pedis & we reviewed the treatment & refilled the LotriminAF cream...  BS has been well controlled on Metformin;  BP stable on meds and mild renal insuffic w/o change;  same of ACD...  only c/o some constipation & we discussed Rx w/ MIRALAX + SENAKOT-S...   ~  March 31, 2010:  4-63mo ROV- he notes weight loss 7# down to 129# w/ good appetite he says... notes some constip- improved w/ Senakot... denies CP/ palpit/ ch in SOB/ etc... no abd pain/ N orV/ bl in stool/ etc... we discussed f/u Labs/ CXR/ Abd Sonar for eval... see prob list below>    Current Problem List:  DENTAL CARIES (ICD-521.00) - this needs attention from his dentist & he promises to see him soon...   HYPERTENSION (ICD-401.9) - on PROCARDIA XL 30mg /d & COZAAR 25mg /d... BP= 140/70, feeling well and tol meds well too... denies  HA, fatigue, visual changes, CP, palipit, dizziness, syncope, dyspnea, edema, etc...  ~  cath 1996 w. norm coronaries... & Cardiolite 2001 was neg...  ~  CXR 4/10 showed norm heart, clear lungs...  ~  CXR 9/11 showed >> he forgot to get his CXR today!  AORTIC STENOSIS (ICD-424.1) - on above meds + IMDUR 30mg /d... denies CP, palpit, dizzy or syncope (he was hosp in 2001 for syncope)... he walks regularly and picks up cans for recycle "It's bread and gas money"... he knows to stay well hydrated.  ~  2DEcho 4/05 showed mod Ao sclerosis & mild Ao root dil, mod asymmetric LVH...   ~  2DEcho 5/09 showed mod incr LVwall thickness w/ focal basal septal hypertropy & end-sys mid cavity oblit, mod calcif AoV c/w mild AS, mild asc Ao dil...  PERIPHERAL VASCULAR DISEASE (ICD-443.9) - on ASA 81mg /d... ArtDopplers of LE's in 2001 w/ calcif vessels and decr toe pressures but norm ABI's... he knows to avoid trauma, inspect feet regularly & watch for pressure sores etc... he sees DrDuda for foot care.  ~  3/11:  sm lesion dorsum left foot> sent to wound care clinic & slowly resolved...  VENOUS INSUFFICIENCY (ICD-459.81) - no edema...  DIABETES MELLITUS (ICD-250.00) - on METFORMIN 500mg Bid...  ~  labs 10/08 showed  BS=107, HgA1c=6.8.[name]. note BUN=25, Creat=1.7.Marland KitchenMarland Kitchen no DM retinopathy per ophthal.  ~  labs 4/09 showed BS= 142, HgA1c= 7.0.[name]. he didn't want to add more meds.  ~  labs 10/09 showed BS= 118, HgA1c= 6.4  ~  labs 4/10 showed BS= 100, HgA1c= 6.7  ~  labs 10/10 showed BS= 121, A1c= 6.3  ~  labs 4/11 showed BS= 95, A1c= 6.8  ~  labs 9/11 showed BS= 96, A1c= 7.0  DIVERTICULOSIS OF COLON (ICD-562.10) - he notes occas constip & we discussed Miralax/ Senakot-S.  COLONIC POLYPS (ICD-211.3) - last colonoscopy 9/99 by DrPatterson was neg without recurrent colon polyps, mild divertics only...  ~  9/11: notes some constip improved w/ Senakot- denies blood etc... check stool cards>   RENAL INSUFFICIENCY (ICD-588.9) -  he has mild renal insuffic w/ BUN= 25, Creat= 1.7 on labs 10/08.  ~  labs 4/09 showed BUN= 23, Creat= 1.8  ~  labs 10/09 showed BUN= 30, Creat= 2.0  ~  labs 4/10 showed BUN= 27, Creat= 1.6  ~  labs 10/10 showed BUN= 29, Creat= 1.5  ~  labs 4/11 showed BUN= 20, Creat= 1.7  ~  labs 9/11 showed BUN= 26, Creat= 1.5  ADENOCARCINOMA, PROSTATE (ICD-185) - on FLOMAX 0.4mg /d from DrPeterson...   ~  10/10:  pt reports being seen by DrPeterson 3-4 mo ago & doing well by his report.  ~  9/11:  labs showed PSA= 1.90  Hx of LOW BACK PAIN, CHRONIC (ICD-724.2) - s/p eval from [facility] for ortho...  SYNCOPE, HX OF (ICD-V12.49)  PERIPHERAL NEUROPATHY (ICD-356.9) - on NEURONTIN 300mg Bid...  SEBACEOUS CYST (ICD-706.2)  ANEMIA (ICD-285.9) -   ~  labs 2008 showed Hg= 11.9  ~  labs 4/09 showed Hg= 11.1  ~  labs 10/09 showed Hg= 10.7  ~  labs 4/10 showed Hg= 11.3  ~  labs 10/10 showed Hg= 10.8, MCV= 89  ~  labs 4/11 showed Hg= 10.4, MCV= 83  ~  labs 9/11 showed Hg= 9.8, MCV= 87 >> we need to check Fe, B12, SPE/ IEP.   Preventive Screening-Counseling & Management  Alcohol-Tobacco     Smoking Status: quit     Year Quit: 1980's  Comments: cigars only  Allergies (verified): No Known Drug Allergies  Comments:  Nurse/Medical Assistant: The patient's medications and allergies were reviewed with the patient and were updated in the Medication and Allergy Lists.  Past History:  Past Medical History: DENTAL CARIES (ICD-521.00) HYPERTENSION (ICD-401.9) AORTIC STENOSIS (ICD-424.1) PERIPHERAL VASCULAR DISEASE (ICD-443.9) VENOUS INSUFFICIENCY (ICD-459.81) DIABETES MELLITUS (ICD-250.00) DIVERTICULOSIS OF COLON (ICD-562.10) COLONIC POLYPS (ICD-211.3) RENAL INSUFFICIENCY (ICD-588.9) ADENOCARCINOMA, PROSTATE (ICD-185) Hx of LOW BACK PAIN, CHRONIC (ICD-724.2) SYNCOPE, HX OF (ICD-V12.49) PERIPHERAL NEUROPATHY (ICD-356.9) SEBACEOUS CYST (ICD-706.2) ANEMIA (ICD-285.9)  Family History: Reviewed  history from 06/18/2009 and no changes required. heart disease - mother otherwise noncontributory  Social History: Reviewed history from 06/18/2009 and no changes required. quit smoking cigars in 1980's x2-47yrs. noi alcohol married 1 children worked for [employer]  Review of Systems      See HPI       The patient complains of weight loss, decreased hearing, and dyspnea on exertion.  The patient denies anorexia, fever, weight gain, vision loss, hoarseness, chest pain, syncope, peripheral edema, prolonged cough, headaches, hemoptysis, abdominal pain, melena, hematochezia, severe indigestion/heartburn, hematuria, incontinence, muscle weakness, suspicious skin lesions, transient blindness, difficulty walking, depression, unusual weight change, abnormal bleeding, enlarged lymph nodes, and angioedema.    Vital Signs:  Patient profile:  77 year old male Height:      67 inches Weight:      129.13 pounds BMI:     20.30 O2 Sat:      99 % on Room air Temp:     98.8 degrees F oral Pulse rate:   54 / minute BP sitting:   140 / 70  (left arm) Cuff size:   regular  Vitals Entered By: Randell Loop CMA (March 31, 2010 2:10 PM)  O2 Sat at Rest %:  99 O2 Flow:  Room air CC: 4-5 month ROV & review of mult medical problems... Is Patient Diabetic? Yes Pain Assessment Patient in pain? no      Comments meds updated today with pt----t brought all of his meds today   Physical Exam  Additional Exam:  WD, WN, 77 y/o BM in NAD... GENERAL:  Alert & oriented; pleasant & cooperative... HEENT:  [place]/AT, EOM-wnl, PERRLA, EACs-clear, TMs-wnl, NOSE-clear, THROAT-clear & wnl; severe dental caries... NECK:  Supple w/ decrROM; no JVD; normal carotid impulses w/o bruits; no thyromegaly or nodules palpated; no lymphadenopathy. CHEST:  Clear to P & A; without wheezes/ rales/ or rhonchi. HEART:  Regular Rhythm;  gr 2/6 SEM without rubs or gallops heard... ABDOMEN:  Soft & nontender; normal bowel  sounds; no organomegaly or masses detected. EXT: without deformities, mod arthritic changes (esp neck) no varicose veins/ +venous insuffic/ no edema. Scar on dorsum left foot, no open area, no drainage, etc... NEURO:  CN's intact;  no focal neuro deficits... touch sensation intact in feet. DERM:  Tinea pedis + dry skin LE's...    MISC. Report  Procedure date:  03/31/2010  Findings:      BMP (METABOL)   Sodium                    142 mEq/L                   135-145   Potassium                 4.9 mEq/L                   3.5-5.1   Chloride                  103 mEq/L                   96-112   Carbon Dioxide            31 mEq/L                    19-32   Glucose                   96 mg/dL                    44-01   BUN                  [H]  26 mg/dL                    0-27   Creatinine                1.5 mg/dL                   2.5-3.6   Calcium  9.0 mg/dL                   2.7-25.3   GFR                       55.91 mL/min                >60  Hepatic/Liver Function Panel (HEPATIC)   Total Bilirubin           0.4 mg/dL                   6.6-4.4   Direct Bilirubin          0.1 mg/dL                   0.3-4.7   Alkaline Phosphatase      60 U/L                      39-117   AST                       19 U/L                      0-37   ALT                       13 U/L                      0-53   Total Protein             6.3 g/dL                    4.2-5.9   Albumin                   3.5 g/dL                    5.6-3.8  CBC Platelet w/Diff (CBCD)   White Cell Count          5.3 K/uL                    4.5-10.5   Red Cell Count       [L]  3.38 Mil/uL                 4.22-5.81   Hemoglobin           [L]  9.8 g/dL                    75.6-43.3   Hematocrit           [L]  29.4 %                      39.0-52.0   MCV                       86.9 fl                     78.0-100.0   Platelet Count            252.0 K/uL                  150.0-400.0   Neutrophil %              69.3  %  43.0-77.0   Lymphocyte %              18.8 %                      12.0-46.0   Monocyte %                10.2 %                      3.0-12.0   Eosinophils%              1.1 %                       0.0-5.0   Basophils %               0.6 %                       0.0-3.0  Comments:      TSH (TSH)   FastTSH                   1.14 uIU/mL                 0.35-5.50   Hemoglobin A1C (A1C)   Hemoglobin A1C       [H]  7.0 %                       4.6-6.5  Prostate Specific Antigen (PSA)   PSA-Hyb                   1.90 ng/mL                  0.10-4.00  Sed Rate (ESR)   Sed Rate             [H]  29 mm/hr                    0-22  Tests: (8) CEA (CEA)   CEA                       1.2 ng/mL                   0.0-5.0  UDip w/Micro (URINE)   Color                     LT. YELLOW   Clarity                   CLEAR                       Clear   Specific Gravity          1.020                       1.000 - 1.030   Urine Ph                  6.5                         5.0-8.0   Protein                   TRACE  Negative   Urine Glucose             NEGATIVE                    Negative   Ketones                   NEGATIVE                    Negative   Urine Bilirubin           NEGATIVE                    Negative   Blood                     NEGATIVE                    Negative   Urobilinogen              0.2                         0.0 - 1.0   Leukocyte Esterace        NEGATIVE                    Negative   Nitrite                   NEGATIVE                    Negative   Urine WBC                 0-2/hpf                     0-2/hpf   Urine RBC                 0-2/hpf                     0-2/hpf   Urine Epith               Rare(0-4/hpf)               Rare(0-4/hpf)   Impression & Recommendations:  Problem # 1:  WEIGHT LOSS (ICD-783.21) 7# weight loss, w/ good appetite he says... we discussed checking routine labs, CXR, Abd Sonar;  and Rx w/ nutritional  supplements betw meals and Hs... Orders: T-2 View CXR (71020TC) TLB-BMP (Basic Metabolic Panel-BMET) (80048-METABOL) TLB-Hepatic/Liver Function Pnl (80076-HEPATIC) TLB-CBC Platelet - w/Differential (85025-CBCD) TLB-TSH (Thyroid Stimulating Hormone) (84443-TSH) TLB-A1C / Hgb A1C (Glycohemoglobin) (83036-A1C) TLB-PSA (Prostate Specific Antigen) (84153-PSA) TLB-Sedimentation Rate (ESR) (85652-ESR) TLB-CEA (Carcinoembryonic Antigen) (82378-CEA) TLB-Udip w/ Micro (81001-URINE) Ultrasound (Ultrasound)  Problem # 2:  DENTAL CARIES (ICD-521.00) He is reminded of the need for dental attention...  Problem # 3:  HYPERTENSION (ICD-401.9) Controlled>  same meds. His updated medication list for this problem includes:    Nifedical Xl 30 Mg Xr24h-tab (Nifedipine) .[name]... Take 1 tablet by mouth once a day    Cozaar 25 Mg Tabs (Losartan potassium) .[name]... Take one tab by mouth once daily  Problem # 4:  AORTIC STENOSIS (ICD-424.1) Murmur unchanged, doubt any worsening stenosis... His updated medication list for this problem includes:    Aspirin Adult Low Strength 81 Mg Tbec (Aspirin) .[name]... Take 1 tab by mouth once daily...  Problem # 5:  PERIPHERAL VASCULAR DISEASE (ICD-443.9) Remains on ASA, no change in pulses etc, we will check Abd Sonar to check Ao (+bruit)...  Problem # 6:  DIABETES MELLITUS (ICD-250.00) He appears stable on the Metformin, watch BS/ A1c, and renal function... His updated medication list for this problem includes:    Aspirin Adult Low Strength 81 Mg Tbec (Aspirin) .[name]... Take 1 tab by mouth once daily...    Cozaar 25 Mg Tabs (Losartan potassium) .[name]... Take one tab by mouth once daily    Metformin Hcl 500 Mg Tb24 (Metformin hcl) .[name]... Take one tab by mouth two times a day  Problem # 7:  COLONIC POLYPS (ICD-211.3) We will check stools for hidden blood...  Problem # 8:  RENAL INSUFFICIENCY (ICD-588.9) Creat has been in the 1.5-1.7 range...  Problem # 9:  ADENOCARCINOMA, PROSTATE  (ICD-185) PSA is improved> followed by DrPeterson (copy to him)...  Problem # 10:  ANEMIA (ICD-285.9) He has anemia of chr dis & has remained stable w/ Hg  ~10...  Complete Medication List: 1)  Aspirin Adult Low Strength 81 Mg Tbec (Aspirin) .... Take 1 tab by mouth once daily.Marland KitchenMarland Kitchen 2)  Nitrostat 0.4 Mg Subl (Nitroglycerin) .... Use as directed 3)  Isosorbide Mononitrate Cr 30 Mg Xr24h-tab (Isosorbide mononitrate) .... Take one tablet by mouth once daily 4)  Nifedical Xl 30 Mg Xr24h-tab (Nifedipine) .... Take 1 tablet by mouth once a day 5)  Cozaar 25 Mg Tabs (Losartan potassium) .... Take one tab by mouth once daily 6)  Metformin Hcl 500 Mg Tb24 (Metformin hcl) .... Take one tab by mouth two times a day 7)  Senna-plus 8.6-50 Mg Tabs (Sennosides-docusate sodium) .... As needed for constipation 8)  Tamsulosin Hcl 0.4 Mg Caps (Tamsulosin hcl) .... Take one by mouth at bedtime 9)  Magnesium Citrate 1.745 Gm/44ml Soln (Magnesium citrate) .... As needed for constipation 10)  Accu-chek Aviva Strp (Glucose blood) .... Check blood sugar once daily  Patient Instructions: 1)  Today we updated your med list- see below.... 2)  continue your current meds the same including the SENNA daily for your bowel movements... 3)  Tioday we did your follow up CXR & blood work... please call the "phone tree" in a few days for your lab results.Marland KitchenMarland Kitchen 4)  Please collect the "stool specimens" & return on the cards provided so we can check for hidden blood... 5)  We will also arrange for an outpatient Abd Ultrasound exam to check for any abnormality that could cause the weight loss... 6)  In the meanwhile- eat regularly at Breakfast, Lunch, [employer]... 7)  Plus drink the Ensure, Sustacal, Boost, or Glucerna 1-2 cans daily (between meals or at bedtime.Marland KitchenMarland Kitchen 8)  Call for any questions.Marland KitchenMarland Kitchen 9)  Let's plan a follow up eval in 6-8 weeks to see how you are doing.Marland KitchenMarland Kitchen

## 2010-08-12 NOTE — Assessment & Plan Note (Signed)
Summary: Acute NP office visit -    CC:  pt would like his left foot checked "to see if it's healing fast enough."  states he feels it has improved since last OV and but still having some dark red drainage.Shane [name]  History of Present Illness: 77 y/o BM with known hx of HTN, GERD, DM    ~  Apr10:  he has no new complaints or concerns... he still exercises by picking up old bottles and cans in the neighborhood "for gas money"...    ~  May 06, 2009:  he's had a good 6 months- no new problems.Shane KitchenMarland KitchenBP well controlled, no CP/ palpit/ dizzy/ etc... watches his BS and it's been doing well w/ range 100-140... he saw DrPeterson for f/u prostate ca about 3-4 months ago & stable by his report... OK Flu shot today...  June 18, 2009 --Presents for an acute office visit. Complains of increased edema in left ankle x1-2weeks with pain. No known injury.  Has been red and tender . no fever.   77 y/o BM here for a follow up visit... he has mult med problems as noted below...   ~  Apr10:  he has no new complaints or concerns... he still exercises by picking up old bottles and cans in the neighborhood "for gas money"...    ~  May 06, 2009:  he's had a good 6 months- no new problems.Shane KitchenMarland KitchenBP well controlled, no CP/ palpit/ dizzy/ etc... watches his BS and it's been doing well w/ range 100-140... he saw DrPeterson for f/u prostate ca about 3-4 months ago & stable by his report... OK Flu shot today...   ~  July 04, 2009:  he developed a blister on the dorsum of his left foot- ?no known trauma, ?pressure from shoe... treated w/ Keklex & topical Rx but remained red/ swollen... given Doxy x10d and improved... in the meanwhile he saw DrDuda, Ortho w/ nails trimmed and using "bag balm" for dry skin...       *** exam shows tinea pedis & clean eschar on dorsum- we will Rx w/ LotriminAF cream...   September 10, 2009 --Presents for follow up of foot sore. pt would like his left foot checked "to see if it's healing fast enough."   states he feels it has improved since last OV, but still having some dark red drainage. Has had small sore that healed along left ankle but now small circular sore along top of left foot that is improved but has scant drainage on bandage daily. Denies chest pain, dyspnea, orthopnea, hemoptysis, fever, n/v/d, edema, headache, known injury, red streaks.    Medications Prior to Update: 1)  Aspirin Adult Low Strength 81 Mg Tbec (Aspirin) .... Take 1 Tab By Mouth Once Daily.Shane [name] 2)  Nitrostat 0.4 Mg Subl (Nitroglycerin) .... Use As Directed 3)  Isosorbide Mononitrate Cr 30 Mg Xr24h-Tab (Isosorbide Mononitrate) .... Take One Tablet By Mouth Once Daily 4)  Procardia Xl 30 Mg  Tb24 (Nifedipine) .... Take One Tab By Mouth Once Daily 5)  Cozaar 25 Mg  Tabs (Losartan Potassium) .... Take One Tab By Mouth Once Daily 6)  Metformin Hcl 500 Mg  Tb24 (Metformin Hcl) .... Take One Tab By Mouth Two Times A Day 7)  Flomax 0.4 Mg  Cp24 (Tamsulosin Hcl) .... Take 1 Tab By Mouth At Bedtime.Shane [name] 8)  Gabapentin 300 Mg  Caps (Gabapentin) .... Take One Tab By Mouth Two Times A Day 9)  Multivitamins  Tabs (Multiple Vitamin) .... Take  1 Tab By Mouth Once Daily... 10)  Accu-Chek Aviva  Strp (Glucose Blood) .... Check Blood Sugar Once Daily 11)  Tramadol Hcl 50 Mg Tabs (Tramadol Hcl) .Shane [name].. 1 Three Times A Day As Needed For Pain 12)  Lotrimin Af 1 % Crea (Clotrimazole) .... Apply To Feet As Directed...  Current Medications (verified): 1)  Aspirin Adult Low Strength 81 Mg Tbec (Aspirin) .... Take 1 Tab By Mouth Once Daily.Shane [name] 2)  Nitrostat 0.4 Mg Subl (Nitroglycerin) .... Use As Directed 3)  Isosorbide Mononitrate Cr 30 Mg Xr24h-Tab (Isosorbide Mononitrate) .... Take One Tablet By Mouth Once Daily 4)  Cozaar 25 Mg  Tabs (Losartan Potassium) .... Take One Tab By Mouth Once Daily 5)  Metformin Hcl 500 Mg  Tb24 (Metformin Hcl) .... Take One Tab By Mouth Two Times A Day 6)  Flomax 0.4 Mg  Cp24 (Tamsulosin Hcl) .... Take 1 Tab By  Mouth At Bedtime.Shane [name] 7)  Gabapentin 300 Mg  Caps (Gabapentin) .... Take One Tab By Mouth Two Times A Day 8)  Multivitamins  Tabs (Multiple Vitamin) .... Take 1 Tab By Mouth Once Daily.Shane [name] 9)  Accu-Chek Aviva  Strp (Glucose Blood) .... Check Blood Sugar Once Daily 10)  Tramadol Hcl 50 Mg Tabs (Tramadol Hcl) .Shane [name].. 1 Three Times A Day As Needed For Pain 11)  Lotrimin Af 1 % Crea (Clotrimazole) .... Apply To Feet As Directed...  Allergies (verified): No Known Drug Allergies  Past History:  Past Medical History: Last updated: 07/04/2009 TINEA PEDIS (ICD-110.4) FOOT PAIN (ICD-729.5)  DENTAL CARIES (ICD-521.00) HYPERTENSION (ICD-401.9) AORTIC STENOSIS (ICD-424.1) PERIPHERAL VASCULAR DISEASE (ICD-443.9) VENOUS INSUFFICIENCY (ICD-459.81) DIABETES MELLITUS (ICD-250.00) DIVERTICULOSIS OF COLON (ICD-562.10) COLONIC POLYPS (ICD-211.3) RENAL INSUFFICIENCY (ICD-588.9) ADENOCARCINOMA, PROSTATE (ICD-185) Hx of LOW BACK PAIN, CHRONIC (ICD-724.2) SYNCOPE, HX OF (ICD-V12.49) PERIPHERAL NEUROPATHY (ICD-356.9) SEBACEOUS CYST (ICD-706.2) ANEMIA (ICD-285.9)  Past Surgical History: Last updated: 06/18/2009 PMH cont   DIABETES MELLITUS (ICD-250.00) - on METFORMIN 500mg Bid...  ~  labs 10/08 showed BS=107, HgA1c=6.8.Shane [name]. note BUN=25, Creat=1.7.Shane [name] no DM retinopathy per ophtal.  ~  labs 4/09 showed BS= 142, HgA1c= 7.0.Shane [name]. he didn't want to add more meds.  ~  labs 10/09 showed BS= 118, HgA1c= 6.4  ~  labs 4/10 showed BS= 100, HgA1c= 6.7  ~  labs 10/10 showed BS= 121, A1c= 6.3  DIVERTICULOSIS OF COLON (ICD-562.10) - he notes occas constip & we discussed Miralax/ Senakot-S. COLONIC POLYPS (ICD-211.3) - last colonoscopy 9/99 by DrPatterson was neg without recurrent colon polyps, mild divertics only...  RENAL INSUFFICIENCY (ICD-588.9) - he has mild renal insuffic w/ BUN= 25, Creat= 1.7 on labs 10/08.  ~  labs 4/09 showed BUN= 23, Creat= 1.8  ~  labs 10/09 showed BUN= 30, Creat= 2.0  ~  labs 4/10 showed BUN=  27, Creat= 1.6  ~  labs 10/10 showed BUN= 29, Creat= 1.5  ADENOCARCINOMA, PROSTATE (ICD-185) - on FLOMAX 0.4mg /d from DrPeterson...   ~  10/10:  pt reports being seen by DrPeterson 3-4 mo ago & doing well by his report.  Hx of LOW BACK PAIN, CHRONIC (ICD-724.2) - s/p eval from DrDuda for ortho...  SYNCOPE, HX OF (ICD-V12.49)  PERIPHERAL NEUROPATHY (ICD-356.9) - on NEURONTIN 300mg Bid...  SEBACEOUS CYST (ICD-706.2)  ANEMIA (ICD-285.9) -   ~  labs 2008 showed Hg= 11.9  ~  labs 4/09 showed Hg= 11.1  ~  labs 10/09 showed Hg= 10.7  ~  labs 4/10 showed Hg= 11.3  ~  labs 10/10 showed Hg= 10.8, MCV= 8  Family History: Last  updated: 06/18/2009 heart disease - mother otherwise noncontributory  Social History: Last updated: 06/18/2009 quit smoking cigars in 1980's x2-75yrs. noi alcohol married 1 children worked for [employer].  Risk Factors: Smoking Status: quit (05/07/2008)  Review of Systems      See HPI  Vital Signs:  Patient profile:   77 year old male Height:      67.5 inches Weight:      141.13 pounds O2 Sat:      100 % on Room air Temp:     97.9 degrees F oral Pulse rate:   53 / minute BP sitting:   120 / 64  (right arm) Cuff size:   regular  Vitals Entered By: Boone Master CNA (September 10, 2009 3:28 PM)  O2 Flow:  Room air  Physical Exam  Additional Exam:  WD, WN, 77 y/o BM in NAD... GENERAL:  Alert & oriented; pleasant & cooperative. HEENT:  [place]/AT,  EACs-clear, TMs-wnl, NOSE-clear, THROAT-clear & wnl; severe dental caries... NECK:  Supple w/ decrROM; no JVD; normal carotid impulses w/o bruits; no thyromegaly or nodules palpated; no lymphadenopathy. CHEST:  Clear to P & A; without wheezes/ rales/ or rhonchi. HEART:  Regular Rhythm;  gr 2/6 SEM without rubs or gallops heard... ABDOMEN:  Soft & nontender; normal bowel sounds; no organomegaly or masses detected. EXT: without deformities, mod arthritic changes (esp neck) no varicose veins/ +venous insuffic/  no edema. see derm for foot exam NEURO:  CN's intact;  no focal neuro deficits... DERM:  along dorsal aspect of left foot, small 0.5cm circular ulcer w/ pink base , no sign. redness.    Impression & Recommendations:  Problem # 1:  CELLULITIS, FOOT (ICD-682.7) Extremity ulcer, will check wound cx. cover for MRSA REC:  Wash with soap and water , pat dry and cover w/ neosporin bandange Elevate foot as needed  I will call with lab results.  Doxycyline 100mg  two times a day for 7 days follow up 2-3 weeks.  Please contact office for sooner follow up if symptoms do not improve or worsen  His updated medication list for this problem includes:    Doxycycline Hyclate 100 Mg Caps (Doxycycline hyclate) .Shane [name]... 1 by mouth two times a day  Orders: T-Culture, Wound ([id number]) Est. Patient Level IV ([address])  Medications Added to Medication List This Visit: 1)  Doxycycline Hyclate 100 Mg Caps (Doxycycline hyclate) .Shane [name].. 1 by mouth two times a day  Complete Medication List: 1)  Aspirin Adult Low Strength 81 Mg Tbec (Aspirin) .... Take 1 tab by mouth once daily.Shane [name] 2)  Nitrostat 0.4 Mg Subl (Nitroglycerin) .... Use as directed 3)  Isosorbide Mononitrate Cr 30 Mg Xr24h-tab (Isosorbide mononitrate) .... Take one tablet by mouth once daily 4)  Cozaar 25 Mg Tabs (Losartan potassium) .... Take one tab by mouth once daily 5)  Metformin Hcl 500 Mg Tb24 (Metformin hcl) .... Take one tab by mouth two times a day 6)  Flomax 0.4 Mg Cp24 (Tamsulosin hcl) .... Take 1 tab by mouth at bedtime.Shane [name] 7)  Gabapentin 300 Mg Caps (Gabapentin) .... Take one tab by mouth two times a day 8)  Multivitamins Tabs (Multiple vitamin) .... Take 1 tab by mouth once daily.Shane [name] 9)  Accu-chek Aviva Strp (Glucose blood) .... Check blood sugar once daily 10)  Tramadol Hcl 50 Mg Tabs (Tramadol hcl) .Shane [name].. 1 three times a day as needed for pain 11)  Lotrimin Af 1 % Crea (Clotrimazole) .... Apply to feet as directed... 12)  Doxycycline  Hyclate 100 Mg Caps (Doxycycline hyclate) .Shane [name].. 1 by mouth two times a day  Patient Instructions: 1)  Wash with soap and water , pat dry and cover w/ neosporin bandange 2)  Elevate foot as needed  3)  I will call with lab results.  4)  Doxycyline 100mg  two times a day for 7 days 5)  follow up 2-3 weeks.  6)  Please contact office for sooner follow up if symptoms do not improve or worsen  Prescriptions: DOXYCYCLINE HYCLATE 100 MG CAPS (DOXYCYCLINE HYCLATE) 1 by mouth two times a day  #14 x 0   Entered and Authorized by:   Rubye Oaks NP   Signed by:   Rubye Oaks NP on 09/10/2009   Method used:   Electronically to        [id number] (retail)       [address] [place], [place]  [address]       Ph: [phone]       Fax: [phone]   RxID:   [id number]

## 2010-08-12 NOTE — Letter (Signed)
Summary: Shane [name]  [facility]   Imported By: Sherian Rein 11/12/2009 13:10:43  _____________________________________________________________________  External Attachment:    Type:   Image     Comment:   External Document

## 2010-08-12 NOTE — Letter (Signed)
Summary: Diabetes Supplies/[facility]  Diabetes Supplies/[facility]   Imported By: Sherian Rein 04/16/2010 13:23:11  _____________________________________________________________________  External Attachment:    Type:   Image     Comment:   External Document

## 2010-08-12 NOTE — Letter (Signed)
Summary: CMN for diabetes supplies/[facility]  CMN for diabetes supplies/[facility]   Imported By: Sherian Rein 04/24/2010 14:47:23  _____________________________________________________________________  External Attachment:    Type:   Image     Comment:   External Document

## 2010-08-20 NOTE — Assessment & Plan Note (Signed)
Summary: FLU SHOT//SH   Nurse Visit   Allergies: No Known Drug Allergies  Immunizations Administered:  Influenza Vaccine # 1:    Vaccine Type: Fluvax MCR    Site: left deltoid    Mfr: GlaxoSmithK[employer]    Dose: 0.5 ml    Route: IM    Given by: Howie Ill    Exp. Date: 01/10/2011    Lot #: [id number]    VIS given: 02/04/10 version given August 12, 2010.  Flu Vaccine Consent Questions:    Do you have a history of severe allergic reactions to this vaccine? no    Any prior history of allergic reactions to egg and/or gelatin? no    Do you have a sensitivity to the preservative Thimersol? no    Do you have a past history of Guillan-Barre Syndrome? no    Do you currently have an acute febrile illness? no    Have you ever had a severe reaction to latex? no    Vaccine information given and explained to patient? yes  Orders Added: 1)  Influenza Vaccine MCR [00025]

## 2010-08-22 ENCOUNTER — Encounter: Payer: Self-pay | Admitting: Pulmonary Disease

## 2010-09-03 NOTE — Medication Information (Signed)
Summary: Glucose Testing Supplies/[facility]  Glucose Testing Supplies/[facility]   Imported By: Sherian Rein 08/27/2010 10:24:42  _____________________________________________________________________  External Attachment:    Type:   Image     Comment:   External Document

## 2010-09-16 ENCOUNTER — Other Ambulatory Visit: Payer: Self-pay | Admitting: Pulmonary Disease

## 2010-09-16 ENCOUNTER — Encounter: Payer: Self-pay | Admitting: Pulmonary Disease

## 2010-09-16 ENCOUNTER — Ambulatory Visit (INDEPENDENT_AMBULATORY_CARE_PROVIDER_SITE_OTHER): Payer: Medicare Other | Admitting: Pulmonary Disease

## 2010-09-16 ENCOUNTER — Other Ambulatory Visit: Payer: Medicare Other

## 2010-09-16 DIAGNOSIS — I739 Peripheral vascular disease, unspecified: Secondary | ICD-10-CM

## 2010-09-16 DIAGNOSIS — D126 Benign neoplasm of colon, unspecified: Secondary | ICD-10-CM

## 2010-09-16 DIAGNOSIS — I10 Essential (primary) hypertension: Secondary | ICD-10-CM

## 2010-09-16 DIAGNOSIS — D649 Anemia, unspecified: Secondary | ICD-10-CM

## 2010-09-16 DIAGNOSIS — E119 Type 2 diabetes mellitus without complications: Secondary | ICD-10-CM

## 2010-09-16 DIAGNOSIS — N259 Disorder resulting from impaired renal tubular function, unspecified: Secondary | ICD-10-CM

## 2010-09-16 DIAGNOSIS — I359 Nonrheumatic aortic valve disorder, unspecified: Secondary | ICD-10-CM

## 2010-09-16 DIAGNOSIS — I872 Venous insufficiency (chronic) (peripheral): Secondary | ICD-10-CM

## 2010-09-16 DIAGNOSIS — K5730 Diverticulosis of large intestine without perforation or abscess without bleeding: Secondary | ICD-10-CM

## 2010-09-16 LAB — BASIC METABOLIC PANEL
Calcium: 9 mg/dL (ref 8.4–10.5)
GFR: 45.9 mL/min — ABNORMAL LOW (ref >60.00)
Glucose, Bld: 96 mg/dL (ref 70–99)
Sodium: 142 mEq/L (ref 135–145)

## 2010-09-16 LAB — CBC WITH DIFFERENTIAL/PLATELET
Basophils Absolute: 0.1 10*3/uL (ref 0.0–0.1)
Basophils Relative: 3 % (ref 0.0–3.0)
Eosinophils Absolute: 0.1 10*3/uL (ref 0.0–0.7)
Lymphocytes Relative: 24 % (ref 12.0–46.0)
MCHC: 34 g/dL (ref 30.0–36.0)
Neutrophils Relative %: 59.2 % (ref 43.0–77.0)
RBC: 3.53 Mil/uL — ABNORMAL LOW (ref 4.22–5.81)
RDW: 16.9 % — ABNORMAL HIGH (ref 11.5–14.6)

## 2010-09-16 LAB — IBC PANEL
Saturation Ratios: 13.2 % — ABNORMAL LOW (ref 20.0–50.0)
Transferrin: 211.8 mg/dL — ABNORMAL LOW (ref 212.0–360.0)

## 2010-09-19 LAB — CONVERTED CEMR LAB
Beta Globulin: 5.3 % (ref 4.7–7.2)
Gamma Globulin: 15.8 % (ref 11.1–18.8)

## 2010-09-23 NOTE — Assessment & Plan Note (Signed)
Summary: 6 month follow up   CC:  6 month ROV & review of mult medical problems....  History of Present Illness: 77 y/o BM here for a follow up visit... he has mult med problems including HBP; AS; ASPVD; DM; Divertics & Colon polyps; Renal insuffic; Prostate cancer; DJD; Anemia...   ~  March 31, 2010:  4-39mo ROV- he notes weight loss 7# down to 129# w/ good appetite he says... notes some constip- improved w/ Senakot... denies CP/ palpit/ ch in SOB/ etc... no abd pain/ N orV/ bl in stool/ etc... we discussed f/u Labs (Creat1.5, Hg9.8, A1c7.0)  CXR (wasn't done- last=4/10 & WNL);  Abd Sonar (Gb OK, +med renal dis)... see prob list below>   ~  September 16, 2010:  57mo ROV, now 77 y/o & he's been married 72yrs> states he's feeling OK w/o new complaints or concerns...    CV:  BP controlled on his 3 meds= 136/68 & he denies CP, palpit, SOB, swelling;  takes 81mg  ASA daily & denies ischemic symptoms & feet are doing satis.    DM:  weight is back up 9# to 138# today;  on MetformBid & BS=96, A1c=7.3 today.    Renal:  Creat range 1.5-2.0 over the last several yrs;  labs today w/ BUN=22, Creat=1.8;  hx prostate cancer on Flomax from DrPeterson (PSA=1.9 Sep11).    Anemia:  Hg range 9.8-11.3 over the last several yrs;  today Hg=10.2, MCV=85, Fe=39 (13%), Stool negx6, B12=236, Folate=norm, SPE=neg...  REC> FeSO4 + VitC + oral B12 trial.      Current Problem List:  DENTAL CARIES (ICD-521.00) - this needs attention from his dentist & he promises to see him soon...   HYPERTENSION (ICD-401.9) - on PROCARDIA XL 30mg /d & COZAAR 25mg /d... BP= 130/68, feeling well and tol meds well too... denies HA, fatigue, visual changes, CP, palipit, dizziness, syncope, dyspnea, edema, etc...  ~  cath 1996 w. norm coronaries... & Cardiolite 2001 was neg...  ~  CXR 4/10 showed norm heart, clear lungs...  ~  CXR 9/11 showed >> he forgot to get his CXR today!  AORTIC STENOSIS (ICD-424.1) - on above meds + IMDUR 30mg /d... denies  CP, palpit, dizzy or syncope (he was hosp in 2001 for syncope)... he walks regularly and picks up cans for recycle "It's bread and gas money"... he knows to stay well hydrated.  ~  2DEcho 4/05 showed mod Ao sclerosis & mild Ao root dil, mod asymmetric LVH...   ~  2DEcho 5/09 showed mod incr LVwall thickness w/ focal basal septal hypertropy & end-sys mid cavity oblit, mod calcif AoV c/w mild AS, mild asc Ao dil...  PERIPHERAL VASCULAR DISEASE (ICD-443.9) - on ASA 81mg /d... ArtDopplers of LE's in 2001 w/ calcif vessels and decr toe pressures but norm ABI's... he knows to avoid trauma, inspect feet regularly & watch for pressure sores etc... he sees DrDuda for foot care.  ~  3/11:  sm lesion dorsum left foot> sent to wound care clinic & slowly resolved...  VENOUS INSUFFICIENCY (ICD-459.81) - no edema...  DIABETES MELLITUS (ICD-250.00) - on METFORMIN 500mg Bid...  ~  labs 10/08 showed BS=107, HgA1c=6.8.[name]. note BUN=25, Creat=1.7.Marland KitchenMarland Kitchen no DM retinopathy per ophthal.  ~  labs 4/09 showed BS= 142, HgA1c= 7.0.[name]. he didn't want to add more meds.  ~  labs 10/09 showed BS= 118, HgA1c= 6.4  ~  labs 4/10 showed BS= 100, HgA1c= 6.7  ~  labs 10/10 showed BS= 121, A1c= 6.3  ~  labs 4/11 showed  BS= 95, A1c= 6.8  ~  labs 9/11 showed BS= 96, A1c= 7.0  DIVERTICULOSIS OF COLON (ICD-562.10) - he notes occas constip & we discussed Miralax/ Senakot-S.  COLONIC POLYPS (ICD-211.3) - last colonoscopy 9/99 by DrPatterson was neg without recurrent colon polyps, mild divertics only...  ~  9/11: notes some constip improved w/ Senakot- denies blood etc... check stool cards>   RENAL INSUFFICIENCY (ICD-588.9) - he has mild renal insuffic w/ BUN= 25, Creat= 1.7 on labs 10/08.  ~  labs 4/09 showed BUN= 23, Creat= 1.8  ~  labs 10/09 showed BUN= 30, Creat= 2.0  ~  labs 4/10 showed BUN= 27, Creat= 1.6  ~  labs 10/10 showed BUN= 29, Creat= 1.5  ~  labs 4/11 showed BUN= 20, Creat= 1.7  ~  labs 9/11 showed BUN= 26, Creat=  1.5  ADENOCARCINOMA, PROSTATE (ICD-185) - on FLOMAX 0.4mg /d from DrPeterson...   ~  10/10:  pt reports being seen by DrPeterson 3-4 mo ago & doing well by his report.  ~  9/11:  labs showed PSA= 1.90  Hx of LOW BACK PAIN, CHRONIC (ICD-724.2) - s/p eval from [facility] for ortho...  SYNCOPE, HX OF (ICD-V12.49)  PERIPHERAL NEUROPATHY (ICD-356.9) - on NEURONTIN 300mg Bid...  SEBACEOUS CYST (ICD-706.2)  ANEMIA (ICD-285.9) -   ~  labs 2008 showed Hg= 11.9  ~  labs 4/09 showed Hg= 11.1  ~  labs 10/09 showed Hg= 10.7  ~  labs 4/10 showed Hg= 11.3  ~  labs 10/10 showed Hg= 10.8, MCV= 89  ~  labs 4/11 showed Hg= 10.4, MCV= 83  ~  labs 9/11 showed Hg= 9.8, MCV= 87 >> we need to check Fe, B12, SPE/ IEP.   Preventive Screening-Counseling & Management  Alcohol-Tobacco     Smoking Status: quit     Year Quit: 1980's  Allergies (verified): No Known Drug Allergies  Comments:  Nurse/Medical Assistant: The patient's medications and allergies were reviewed with the patient and were updated in the Medication and Allergy Lists.  Family History: Reviewed history from 03/31/2010 and no changes required. heart disease - mother otherwise noncontributory  Social History: Reviewed history from 03/31/2010 and no changes required. quit smoking cigars in 1980's x2-65yrs. noi alcohol married 1 children worked for [employer]  Review of Systems      See HPI       The patient complains of dyspnea on exertion.  The patient denies anorexia, fever, weight loss, weight gain, vision loss, decreased hearing, hoarseness, chest pain, syncope, peripheral edema, prolonged cough, headaches, hemoptysis, abdominal pain, melena, hematochezia, severe indigestion/heartburn, hematuria, incontinence, muscle weakness, suspicious skin lesions, transient blindness, difficulty walking, depression, unusual weight change, abnormal bleeding, enlarged lymph nodes, and angioedema.    Vital Signs:  Patient profile:   77  year old male Height:      67 inches Weight:      137.38 pounds O2 Sat:      100 % on Room air Temp:     99.3 degrees F oral Pulse rate:   55 / minute BP sitting:   130 / 68  (left arm) Cuff size:   regular  Vitals Entered By: Randell Loop CMA (September 16, 2010 2:37 PM)  O2 Sat at Rest %:  100 O2 Flow:  Room air CC: 6 month ROV & review of mult medical problems... Is Patient Diabetic? Yes Pain Assessment Patient in pain? no      Comments no changes in meds today---pt brought his meds today  Physical Exam  Additional Exam:  WD, WN, 77 y/o BM in NAD... GENERAL:  Alert & oriented; pleasant & cooperative... HEENT:  [place]/AT, EOM-wnl, PERRLA, EACs-clear, TMs-wnl, NOSE-clear, THROAT-clear & wnl; severe dental caries... NECK:  Supple w/ decrROM; no JVD; normal carotid impulses w/o bruits; no thyromegaly or nodules palpated; no lymphadenopathy. CHEST:  Clear to P & A; without wheezes/ rales/ or rhonchi. HEART:  Regular Rhythm;  gr 2/6 SEM without rubs or gallops heard... ABDOMEN:  Soft & nontender; normal bowel sounds; no organomegaly or masses detected. EXT: without deformities, mod arthritic changes (esp neck) no varicose veins/ +venous insuffic/ no edema. Scar on dorsum left foot, no open area, no drainage, etc... NEURO:  CN's intact;  no focal neuro deficits... touch sensation intact in feet. DERM:  Tinea pedis + dry skin LE's...    Impression & Recommendations:  Problem # 1:  HYPERTENSION (ICD-401.9) BP stable on meds>  continue same. His updated medication list for this problem includes:    Nifedical Xl 30 Mg Xr24h-tab (Nifedipine) .[name]... Take 1 tablet by mouth once a day    Cozaar 25 Mg Tabs (Losartan potassium) .[name]... Take one tab by mouth once daily  Orders: TLB-BMP (Basic Metabolic Panel-BMET) (80048-METABOL) >> BS 96, Creat 1.8 TLB-A1C / Hgb A1C (Glycohemoglobin) (83036-A1C) >> 7.3 TLB-CBC Platelet - w/Differential (85025-CBCD) >> Hg 10.2 TLB-IBC Pnl  (Iron/FE;Transferrin) (83550-IBC) >> Fe 39 (13%) TLB-B12 + Folate Pnl (13086_57846-N62/XBM) >> B12 236 T-SPE w/reflex to IFE (84132-44010) >> neg  Problem # 2:  AORTIC STENOSIS (ICD-424.1) Clinically stable w/o CP, palpit, dizzy, syncope etc... same meds. His updated medication list for this problem includes:    Aspirin Adult Low Strength 81 Mg Tbec (Aspirin) .[name]... Take 1 tab by mouth once daily...  Problem # 3:  PERIPHERAL VASCULAR DISEASE (ICD-443.9) Denies claud symptoms, feet are fine w/o lesions, etc...  Problem # 4:  DIABETES MELLITUS (ICD-250.00) wt is up 9# obviously eating better... A1c= 7.3 on Metformin... continue same for now & avoid sweets. His updated medication list for this problem includes:    Aspirin Adult Low Strength 81 Mg Tbec (Aspirin) .[name]... Take 1 tab by mouth once daily...    Cozaar 25 Mg Tabs (Losartan potassium) .[name]... Take one tab by mouth once daily    Metformin Hcl 500 Mg Tb24 (Metformin hcl) .[name]... Take one tab by mouth two times a day  Problem # 5:  GI >> Stable w/o constip etc... recent stool cards were neg for blood.  Problem # 6:  RENAL INSUFFICIENCY (ICD-588.9) Creat= 1.8 & in the same range as prev...  Problem # 7:  ADENOCARCINOMA, PROSTATE (ICD-185) Followed by DrPeterson & PSA 1.90 last Sept...  Problem # 8:  ANEMIA (ICD-285.9) Anemia w/u is neg> but Fe sl low & B12 borderline... would like to start B12 1000 micrograms orally daily, & combination FeSO4 + Vit C 500... we will recheck labs on return....  Complete Medication List: 1)  Aspirin Adult Low Strength 81 Mg Tbec (Aspirin) .... Take 1 tab by mouth once daily.Marland KitchenMarland Kitchen 2)  Nitrostat 0.4 Mg Subl (Nitroglycerin) .... Use as directed 3)  Isosorbide Mononitrate Cr 30 Mg Xr24h-tab (Isosorbide mononitrate) .... Take one tablet by mouth once daily 4)  Nifedical Xl 30 Mg Xr24h-tab (Nifedipine) .... Take 1 tablet by mouth once a day 5)  Cozaar 25 Mg Tabs (Losartan potassium) .... Take one tab by mouth once  daily 6)  Metformin Hcl 500 Mg Tb24 (Metformin hcl) .... Take one tab by mouth two  times a day 7)  Senna-plus 8.6-50 Mg Tabs (Sennosides-docusate sodium) .... As needed for constipation 8)  Tamsulosin Hcl 0.4 Mg Caps (Tamsulosin hcl) .... Take one by mouth at bedtime 9)  Magnesium Citrate 1.745 Gm/45ml Soln (Magnesium citrate) .... As needed for constipation  Patient Instructions: 1)  Today we updated your med list- see below.... 2)  We refilled your meds for 2012... 3)  Today we did your follow up blood work... please call the "phone tree" in a few days for your lab results.Marland KitchenMarland Kitchen 4)  Congrats on **70** years of marriage!!! 5)  Call for any problems.Marland KitchenMarland Kitchen 6)  Please schedule a follow-up appointment in 6 months, sooner as needed... Prescriptions: TAMSULOSIN HCL 0.4 MG CAPS (TAMSULOSIN HCL) Take one by mouth at bedtime  #30 x 12   Entered and Authorized by:   Michele Mcalpine MD   Signed by:   Michele Mcalpine MD on 09/16/2010   Method used:   Print then Give to Patient   RxID:   [id number] METFORMIN HCL 500 MG  TB24 (METFORMIN HCL) take one tab by mouth two times a day  #60 x 12   Entered and Authorized by:   Michele Mcalpine MD   Signed by:   Michele Mcalpine MD on 09/16/2010   Method used:   Print then Give to Patient   RxID:   [id number] COZAAR 25 MG  TABS (LOSARTAN POTASSIUM) take one tab by mouth once daily  #30 x 12   Entered and Authorized by:   Michele Mcalpine MD   Signed by:   Michele Mcalpine MD on 09/16/2010   Method used:   Print then Give to Patient   RxID:   [id number] NIFEDICAL XL 30 MG XR24H-TAB (NIFEDIPINE) Take 1 tablet by mouth once a day  #30 x 12   Entered and Authorized by:   Michele Mcalpine MD   Signed by:   Michele Mcalpine MD on 09/16/2010   Method used:   Print then Give to Patient   RxID:   [id number] ISOSORBIDE MONONITRATE CR 30 MG XR24H-TAB (ISOSORBIDE MONONITRATE) take one tablet by mouth once daily  #30 x 12   Entered and Authorized by:   Michele Mcalpine  MD   Signed by:   Michele Mcalpine MD on 09/16/2010   Method used:   Print then Give to Patient   RxID:   [id number] NITROSTAT 0.4 MG SUBL (NITROGLYCERIN) Use as directed  #25 x 6   Entered and Authorized by:   Michele Mcalpine MD   Signed by:   Michele Mcalpine MD on 09/16/2010   Method used:   Print then Give to Patient   RxID:   [id number]

## 2010-09-25 ENCOUNTER — Encounter: Payer: Self-pay | Admitting: Pulmonary Disease

## 2010-09-30 NOTE — Miscellaneous (Signed)
Summary: lab results/new meds  Medications Added FEOSOL 200 (65 FE) MG TABS (FERROUS SULFATE DRIED) take one tablet by mouth once daily along with vit c 500mg  daily VITAMIN B-12 1000 MCG TABS (CYANOCOBALAMIN) take one tablet by mouth once daily       Clinical Lists Changes  Medications: Added new medication of FEOSOL 200 (65 FE) MG TABS (FERROUS SULFATE DRIED) take one tablet by mouth once daily along with vit c 500mg  daily Added new medication of VITAMIN B-12 1000 MCG TABS (CYANOCOBALAMIN) take one tablet by mouth once daily    called and spoke with pt and he is aware of lab results per SN----requested that i fax a paper over to his pharmacy with the names of b12 and feosol and vit c so they can help pt get what he needs to take---pt stated that his memory is bad and he cant remember what meds to take. Randell Loop CMA  September 25, 2010 5:02 PM

## 2010-10-02 ENCOUNTER — Other Ambulatory Visit: Payer: Self-pay | Admitting: Pulmonary Disease

## 2010-10-03 LAB — GLUCOSE, CAPILLARY: Glucose-Capillary: 92 mg/dL (ref 70–99)

## 2010-11-27 ENCOUNTER — Other Ambulatory Visit: Payer: Self-pay | Admitting: *Deleted

## 2010-11-27 MED ORDER — METFORMIN HCL 500 MG PO TABS: 500.0000 mg | ORAL_TABLET | Freq: Two times a day (BID) | ORAL | Status: DC

## 2010-11-28 NOTE — Discharge Summary (Signed)
[facility]  Patient:    Shane [name], Shane [name]                       MRN: [id number] Adm. Date:  [id number] Disc. Date: [id number] Attending:  Kelvin Cellar Dictator:   Joellyn Rued, P.A.C. CC:         Lonzo Cloud. Kriste Basque, M.D. [facility]                           Discharge Summary  DATE OF BIRTH:  09-17-1916.  SUMMARY OF HISTORY:  Shane [name] is an 77 year old black male who presently works at [employer] for the past three years.  The patient, on returning from work, he was sitting in the garage waiting for his wife and got a funny feeling.  He denied any chest discomfort.  His history is notable for hypertension, diabetes, prostate cancer with radiation treatment.  LABORATORY DATA:  CKs were negative for myocardial infarction, however, admission troponin was slightly elevated at 0.33 and 0.35.  TSH was 0.965.  Chest x-ray did not show any active disease.  Echocardiogram showed a slightly thickened and calcified, mildly restricted aortic valve, questionable functional bicuspid.  Peak and mean gradient through the valve were 23 and 7 mmHg.  Trace AI.  No significant change since June 2000.  Normal ejection fraction.  MR 1+, mild TR, slightly elevated right systolic pressures t 30 to 40 mmHg consistent with mild pulmonary hypertension.  EKG shows normal sinus rhythm, old inferior myocardial infarction.  HOSPITAL COURSE:  Shane [name] was admitted to the unit 2000.  His troponins were slightly abnormal, however, his EKGs and CKs remained within normal limits. Overnight he did not have any further episodes and he denied any shortness of breath, chest discomfort or dizziness.  Orthostatic blood pressures were unremarkable.  Dr. Kriste Basque saw him as well.  Carotid Dopplers were performed and id not show any ICA stenoses bilaterally.  Vertebral flow was antegrade bilaterally. On Nov 15, 1999, a Persantine Cardiolite was performed without  difficulty. During the test, he did have some chest tightness which controlled with 75 mg IV aminophylline.  He did not have any changes.  Imagining showed and EF of 63%, no signs of ischemia or wall motion abnormalities.  Dr. Chales Abrahams, upon reviewing these findings, felt that he could be discharged home.  DISCHARGE DIAGNOSES: 1. Near syncope, questionable etiology. 2. Hypertension.  DISPOSITION:  He was asked to continue his home medications.  According to the chart these include: 1. Imdur 30 mg q.d. 2. Cozaar 25 q.d. 3. Glucophage q.d. 4. Procardia XL 30 mg q.d. 5. Stool softener. 6. Aspirin 325 q.d.  His activity was not restricted.  He was asked to maintain low salt, fat, cholesterol ADA diet and on Monday call Dr. Kriste Basque for a two to three week appointment. DD:  11/15/99 TD:  11/15/99 Job: [id number]

## 2011-03-24 ENCOUNTER — Ambulatory Visit: Payer: Medicare Other | Admitting: Pulmonary Disease

## 2011-04-13 ENCOUNTER — Ambulatory Visit (INDEPENDENT_AMBULATORY_CARE_PROVIDER_SITE_OTHER): Payer: Medicare Other | Admitting: Pulmonary Disease

## 2011-04-13 ENCOUNTER — Other Ambulatory Visit (INDEPENDENT_AMBULATORY_CARE_PROVIDER_SITE_OTHER): Payer: Medicare Other

## 2011-04-13 ENCOUNTER — Encounter: Payer: Self-pay | Admitting: Pulmonary Disease

## 2011-04-13 DIAGNOSIS — M545 Low back pain, unspecified: Secondary | ICD-10-CM

## 2011-04-13 DIAGNOSIS — E119 Type 2 diabetes mellitus without complications: Secondary | ICD-10-CM

## 2011-04-13 DIAGNOSIS — E538 Deficiency of other specified B group vitamins: Secondary | ICD-10-CM

## 2011-04-13 DIAGNOSIS — C61 Malignant neoplasm of prostate: Secondary | ICD-10-CM

## 2011-04-13 DIAGNOSIS — I739 Peripheral vascular disease, unspecified: Secondary | ICD-10-CM

## 2011-04-13 DIAGNOSIS — G609 Hereditary and idiopathic neuropathy, unspecified: Secondary | ICD-10-CM

## 2011-04-13 DIAGNOSIS — D649 Anemia, unspecified: Secondary | ICD-10-CM

## 2011-04-13 DIAGNOSIS — I359 Nonrheumatic aortic valve disorder, unspecified: Secondary | ICD-10-CM

## 2011-04-13 DIAGNOSIS — N259 Disorder resulting from impaired renal tubular function, unspecified: Secondary | ICD-10-CM

## 2011-04-13 DIAGNOSIS — B353 Tinea pedis: Secondary | ICD-10-CM

## 2011-04-13 DIAGNOSIS — Z23 Encounter for immunization: Secondary | ICD-10-CM

## 2011-04-13 DIAGNOSIS — I872 Venous insufficiency (chronic) (peripheral): Secondary | ICD-10-CM

## 2011-04-13 DIAGNOSIS — I10 Essential (primary) hypertension: Secondary | ICD-10-CM

## 2011-04-13 LAB — CBC WITH DIFFERENTIAL/PLATELET
Basophils Relative: 0.4 % (ref 0.0–3.0)
Eosinophils Relative: 1.9 % (ref 0.0–5.0)
HCT: 31.5 % — ABNORMAL LOW (ref 39.0–52.0)
Lymphs Abs: 1.2 10*3/uL (ref 0.7–4.0)
MCV: 86.3 fl (ref 78.0–100.0)
Monocytes Absolute: 0.7 10*3/uL (ref 0.1–1.0)
RBC: 3.65 Mil/uL — ABNORMAL LOW (ref 4.22–5.81)
WBC: 4.9 10*3/uL (ref 4.5–10.5)

## 2011-04-13 LAB — BASIC METABOLIC PANEL
BUN: 29 mg/dL — ABNORMAL HIGH (ref 6–23)
Chloride: 105 mEq/L (ref 96–112)
Potassium: 5.5 mEq/L — ABNORMAL HIGH (ref 3.5–5.1)

## 2011-04-13 MED ORDER — ATENOLOL 50 MG PO TABS: 50.0000 mg | ORAL_TABLET | Freq: Every day | ORAL | Status: DC

## 2011-04-13 MED ORDER — CLOTRIMAZOLE 1 % EX CREA: TOPICAL_CREAM | Freq: Two times a day (BID) | CUTANEOUS | Status: DC

## 2011-04-13 MED ORDER — LOSARTAN POTASSIUM 25 MG PO TABS: 50.0000 mg | ORAL_TABLET | Freq: Every day | ORAL | Status: DC

## 2011-04-13 NOTE — Progress Notes (Signed)
Subjective:    Patient ID: Noelle Hoogland, male    DOB: 29-Jan-1917, 77 y.o.   MRN: [id number]  HPI 77 y/o BM here for a follow up visit... he has mult med problems including HBP; AS; ASPVD; DM; Divertics & Colon polyps; Renal insuffic; Prostate cancer; DJD; Anemia...  ~  March 31, 2010:  4-71mo ROV- he notes weight loss 7# down to 129# w/ good appetite he says... notes some constip- improved w/ Senakot... denies CP/ palpit/ ch in SOB/ etc... no abd pain/ NorV/ bl in stool/ etc... we discussed f/u Labs (Creat1.5, Hg9.8, A1c7.0)  CXR (wasn't done- last=4/10 & WNL);  Abd Sonar (Gb OK, +med renal dis).  ~  September 16, 2010:  2mo ROV, now 77 y/o & he's been married 31yrs> states he's feeling OK w/o new complaints or concerns...    CV:  BP controlled on his 3 meds= 136/68 & he denies CP, palpit, SOB, swelling;  takes 81mg  ASA daily & denies ischemic symptoms & feet are doing satis.    DM:  weight is back up 9# to 138# today;  on MetformBid & BS=96, A1c=7.3 today.    Renal:  Creat range 1.5-2.0 over the last several yrs;  labs today w/ BUN=22, Creat=1.8;  hx prostate cancer on Flomax from DrPeterson (PSA=1.9 Sep11).    Anemia:  Hg range 9.8-11.3 over the last several yrs;  today Hg=10.2, MCV=85, Fe=39 (13%), Stool negx6, B12=236, Folate=norm, SPE=neg...  REC> FeSO4 + VitC + oral B12 trial.  ~  April 13, 2011:  20mo ROV & Markevious states he's about the same but looks somewhat weaker, more feeble; his CC is his feet- he has mod Tinea Pedis & we reviewed treatment protocol w/ mild soapy water, gauze wipes, then Clotrimazole 1% cream & cotton betw toes >> SEE PROB LIST BELOW:    HBP>  On Nifedipine30 & Losartan25 w/ BP today= 180/90; we called Pharm & his great niece works there> she confirms fairly regular filling rx & pick ups but pt didn't bring bottles or list to office today; we decided to check labs> (see below); and adjust meds> incr Losartan to 50mg /d & add Atenolol 50mg /d.    AS>  On ASA81 + Imdur30 as  well; denies CP, palpit, dizzy, syncope, ch in edema; states he is still picking up cans to recycle.    DM>  On Metform500Bid; Labs today showed BS=108, A1c=6.8; continue same, incr fluid intake...    RI>  AbdSonar 9/11 showed medical renal dis, no hydroneph etc; labs today showed BUN=29, Creat=1.7; rec incr fluid intake...    Prostate Ca>  Followed by DrPeterson & his note of 6/12 reviewed> XRT treatment in 1995, PSAs ~2 w/o much change, fairly good flow, nocturia x1, on Flomax.    Anemia>  On FeSO4, VitC, B12 1034mcg/d since 3/12; f/u labs revealed Hg=10.3, MCV=86, B12=1500; rec continue Fe/ VitC/ ok to decr B12 to 1/2...             Problem List:  DENTAL CARIES (ICD-521.00) - this needs attention from his dentist & he promises to see him soon for f/u...  HYPERTENSION (ICD-401.9) - on PROCARDIA XL 30mg /d & COZAAR 25mg /d... ~  cath 1996 w/ norm coronaries, & Cardiolite 2001 was neg... ~  CXR 4/10 showed norm heart, clear lungs... ~  CXR 9/11 showed >> he forgot to get his CXR today! ~  10/12: BP elevated at OV today 180/90 & we decided to increase Losartan50 & add Atenolol50...  AORTIC STENOSIS (  ICD-424.1) - on ASA 81mg  + IMDUR 30mg /d... denies CP, palpit, dizzy or syncope (he was hosp in 2001 for syncope)... he walks regularly and picks up cans for recycle "It's bread and gas money"... he knows to stay well hydrated. ~  2DEcho 4/05 showed mod Ao sclerosis & mild Ao root dil, mod asymmetric LVH...  ~  2DEcho 5/09 showed mod incr LVwall thickness w/ focal basal septal hypertropy & end-sys mid cavity oblit, mod calcif AoV c/w mild AS, mild asc Ao dil...  PERIPHERAL VASCULAR DISEASE (ICD-443.9) - on ASA 81mg /d... ArtDopplers of LE's in 2001 w/ calcif vessels and decr toe pressures but norm ABI's... he knows to avoid trauma, inspect feet regularly & watch for pressure sores etc> he has Tinea Pedis & we reviewed treatment protocol. ~  3/11:  sm lesion dorsum left foot> sent to wound care clinic &  slowly resolved... ~  9/12:  Mod tinea pedis left>right & we decided to treat w/ Clotrimazole cream...  VENOUS INSUFFICIENCY (ICD-459.81) - tr edema & he knows to elim salt, elevate legs, wear support hose...  DIABETES MELLITUS (ICD-250.00) - on METFORMIN 500mg Bid... ~  labs 10/08 showed BS=107, HgA1c=6.8.[name]. note BUN=25, Creat=1.7.Marland KitchenMarland Kitchen no DM retinopathy per ophthal. ~  labs 4/09 showed BS= 142, HgA1c= 7.0.[name]. he didn't want to add more meds. ~  labs 10/09 showed BS= 118, HgA1c= 6.4 ~  labs 4/10 showed BS= 100, HgA1c= 6.7 ~  labs 10/10 showed BS= 121, A1c= 6.3 ~  labs 4/11 showed BS= 95, A1c= 6.8 ~  labs 9/11 showed BS= 96, A1c= 7.0 ~  Labs 3/12 on Metform500Bid (wt=137#) showed BS=96, A1c=7.3 ~  Labs 10/12 on Metform500Bid (wt=140#) showed BS=108, A1c=6.8  DIVERTICULOSIS OF COLON (ICD-562.10) - he notes occas constip & we discussed Miralax/ Senakot-S.  COLONIC POLYPS (ICD-211.3) - last colonoscopy 9/99 by DrPatterson was neg without recurrent colon polyps, mild divertics only... ~  9/11: notes some constip improved w/ Senakot- denies blood etc... check stool cards> 6/6 neg.  RENAL INSUFFICIENCY (ICD-588.9) - he has mild renal insuffic w/ BUN= 25, Creat= 1.7 on labs 10/08. ~  labs 4/09 showed BUN= 23, Creat= 1.8 ~  labs 10/09 showed BUN= 30, Creat= 2.0 ~  labs 4/10 showed BUN= 27, Creat= 1.6 ~  labs 10/10 showed BUN= 29, Creat= 1.5 ~  labs 4/11 showed BUN= 20, Creat= 1.7 ~  labs 9/11 showed BUN= 26, Creat= 1.5, AbdSonar w/ incr echogenicity c/w med renal dis, no hydroneph. ~  Labs 3/12 showed BUN= 22, Creat= 1.8 ~  Labs 10/12 showed BUN= 29, Creat= 1.7  ADENOCARCINOMA, PROSTATE (ICD-185) - on FLOMAX 0.4mg /d from DrPeterson...  ~  10/10:  pt reports being seen by DrPeterson 3-4 mo ago & doing well by his report. ~  9/11:  labs showed PSA= 1.90 ~  6/12:  Last note from DrPeterson; stable, XRT treatment in 1995, PSAs ~2 w/o much change, fairly good flow, nocturia x1, on Flomax.  Hx of  LOW BACK PAIN, CHRONIC (ICD-724.2) - s/p eval from DrDuda for ortho...  SYNCOPE, HX OF (ICD-V12.49)  PERIPHERAL NEUROPATHY (ICD-356.9) - off prev Neurontin Rx...  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 ~  labs 2008 showed Hg= 11.9 ~  labs 4/09 showed Hg= 11.1 ~  labs 10/09 showed Hg= 10.7 ~  labs 4/10 showed Hg= 11.3 ~  labs 10/10 showed Hg= 10.8, MCV= 89 ~  labs 4/11 showed Hg= 10.4, MCV= 83 ~  labs 9/11 showed Hg= 9.8, MCV= 87, stool cards neg x6... ~  Labs 3/12 showed Hg= 10.2, MCV= 85, Fe= 39 (13%sat), B12=236, Folate>25, SPE=neg ~  Labs 10/12 showed Hg= 10.3, MCV= 86, B12= 1500; continue Fe, ok to cut B12 to 1/2...   No past surgical history on file.   Outpatient Encounter Prescriptions as of 04/13/2011 >> HE DIDN'T BRING BOTTLES OR LIST.  Medication Sig Dispense Refill  . aspirin 81 MG tablet Take 81 mg by mouth daily.        . Ferrous Sulfate Dried (FEOSOL) 200 (65 FE) MG TABS Take 1 tablet by mouth daily. Take with vitamin c 500mg  daily       . isosorbide mononitrate (IMDUR) 30 MG 24 hr tablet Take one (1) tablet(s) once daily  30 tablet  6  . losartan (COZAAR) 25 MG tablet Take 2 tablets (50 mg total) by mouth daily.  30 tablet  11  . magnesium citrate (BL MAGNESIUM CITRATE) 1.745 GM/30ML SOLN As needed for constipation       . metFORMIN (GLUCOPHAGE) 500 MG tablet Take 1 tablet (500 mg total) by mouth 2 (two) times daily with a meal.  60 tablet  11  . NIFEdipine (PROCARDIA XL/ADALAT-CC) 30 MG 24 hr tablet Take 30 mg by mouth daily.        . nitroGLYCERIN (NITROSTAT) 0.4 MG SL tablet Place 0.4 mg under the tongue every 5 (five) minutes as needed.        . senna-docusate (SENOKOT-S) 8.6-50 MG per tablet Take 1 tablet by mouth daily.        . Tamsulosin HCl (FLOMAX) 0.4 MG CAPS Take 0.4 mg by mouth daily after supper.        . vitamin B-12 (CYANOCOBALAMIN) 1000 MCG tablet Take 1,000 mcg by mouth  daily.        [name] DISCONTD: losartan (COZAAR) 25 MG tablet Take 25 mg by mouth daily.    ==> INCREASED to 50mg /d    . atenolol (TENORMIN) 50 MG tablet Take 1 tablet (50 mg total) by mouth daily.  30 tablet ADDED today.  11  . clotrimazole (LOTRIMIN) 1 % cream Apply topically 2 (two) times daily. To toes  30 g ADDED today  5    No Known Allergies   Current Medications, Allergies, Past Medical History, Past Surgical History, Family History, and Social History were reviewed in [employer] record.    Review of Systems        See HPI - all other systems neg except as noted...  The patient complains of dyspnea on exertion.  The patient denies anorexia, fever, weight loss, weight gain, vision loss, decreased hearing, hoarseness, chest pain, syncope, peripheral edema, prolonged cough, headaches, hemoptysis, abdominal pain, melena, hematochezia, severe indigestion/heartburn, hematuria, incontinence, muscle weakness, suspicious skin lesions, transient blindness, difficulty walking, depression, unusual weight change, abnormal bleeding, enlarged lymph nodes, and angioedema.     Objective:   Physical Exam    WD, WN, 77 y/o BM in NAD... GENERAL:  Alert & oriented; pleasant & cooperative... HEENT:  [place]/AT, EOM-wnl, PERRLA, EACs-clear, TMs-wnl, NOSE-clear, THROAT-clear & wnl; severe dental caries... NECK:  Supple w/ decrROM; no JVD; normal carotid impulses w/o bruits; no thyromegaly or nodules palpated; no lymphadenopathy. CHEST:  Clear to P & A; without wheezes/ rales/ or rhonchi. HEART:  Regular Rhythm;  gr 2/6 SEM without rubs or gallops heard... ABDOMEN:  Soft & nontender; normal bowel sounds; no organomegaly or masses detected. EXT: without deformities,  mod arthritic changes (esp neck) no varicose veins/ +venous insuffic/ no edema. Scar on dorsum left foot, no open area, no drainage, etc... NEURO:  CN's intact;  no focal neuro deficits... touch sensation intact in  feet. DERM:  Tinea pedis + dry skin LE's...   Assessment & Plan:   HBP>  On Nifedipine30 & Losartan25 w/ BP today= 180/90; we called Pharm & his great niece works there> she confirms fairly regular filling rx & pick ups but pt didn't bring bottles or list to office today; we decided to check labs> (see below); and adjust meds> incr Losartan to 50mg /d & add Atenolol 50mg /d.  AS>  On ASA81 + Imdur30 as well; denies CP, palpit, dizzy, syncope, ch in edema; states he is still picking up cans to recycle.  ASPVD>  On ASA, he denies claudication symptoms but not paying attn to feet w/ mod tinea present...  Ven Insuffic>  Mod tinea pedis left>right & we decided to treat w/ Clotrimazole cream...  DM>  On Metform500Bid; Labs today showed BS=108, A1c=6.8; continue same, incr fluid intake...  GI> Divertics, Polyps>  On Miralax, Senakot; regular BMs if he takes these regularly...  Renal Insuffic>  Stable w/ Creat= 1.7, advised on nutrition & incr oral fluids...  Prostate Cancer>  Followed by DrPeterson & his note of 6/12 reviewed> XRT treatment in 1995, PSAs ~2 w/o much change, fairly good flow, nocturia x1, on Flomax.  LBP>  He manages fairly well & gets plenty of exercise...  Anemia>  On FeSO4, VitC, B12 1048mcg/d since 3/12; f/u labs revealed Hg=10.3, MCV=86, B12=1500; rec continue Fe/ VitC/ ok to decr B12 to 1/2.Marland KitchenMarland Kitchen

## 2011-04-13 NOTE — Patient Instructions (Signed)
Today we updated your med list in EPIC...  For your BP:    We decided to double your LOSARTAN (Cozaar) from 25mg  to 50mg  per day...    And we added an additional medication- ATENOLOL 50mg  one tab daily...    Continue your other BP med- NIFEDIPINE 30mg - one tab daily...    And be sure to eliminate the salt from your diet!!!]  For your "athlete's foot">    Use the Clotrimazole cream twice daily betw the toes & clean the area well before each application...    Remember to keep the skin off skin by putting a piece of gauze or a small cotton ball betw the toes...  Today we did your follow up blood work...    Please call the [facility] in a few days for your results...    Dial [id number] & when prompted enter your patient number followed by the # symbol...    Your patient number is:  [id number]  Call for any questions...  We should plan a recheck in about 6 weeks.Marland KitchenMarland Kitchen

## 2011-04-14 ENCOUNTER — Other Ambulatory Visit: Payer: Self-pay | Admitting: *Deleted

## 2011-04-14 MED ORDER — LOSARTAN POTASSIUM 25 MG PO TABS: 50.0000 mg | ORAL_TABLET | Freq: Every day | ORAL | Status: DC

## 2011-04-16 ENCOUNTER — Encounter: Payer: Self-pay | Admitting: Pulmonary Disease

## 2011-05-25 ENCOUNTER — Ambulatory Visit: Payer: Medicare Other | Admitting: Pulmonary Disease

## 2011-06-18 ENCOUNTER — Telehealth: Payer: Self-pay | Admitting: Pulmonary Disease

## 2011-06-18 NOTE — Telephone Encounter (Signed)
Yes i have and once SN signs them we will fax them back.  thanks

## 2011-06-18 NOTE — Telephone Encounter (Signed)
Leigh have you seen forms from the diabetic support program? Please advise, and if not I will have them refax, thanks!

## 2011-06-18 NOTE — Telephone Encounter (Signed)
Spoke with Fleet Contras and notified that the forms are here and will be faxed back once SN signs them. She verbalized understanding and states nothing further needed.

## 2011-07-15 ENCOUNTER — Encounter: Payer: Self-pay | Admitting: Pulmonary Disease

## 2011-07-15 ENCOUNTER — Ambulatory Visit (INDEPENDENT_AMBULATORY_CARE_PROVIDER_SITE_OTHER): Payer: Medicare Other | Admitting: Pulmonary Disease

## 2011-07-15 DIAGNOSIS — I739 Peripheral vascular disease, unspecified: Secondary | ICD-10-CM

## 2011-07-15 DIAGNOSIS — I359 Nonrheumatic aortic valve disorder, unspecified: Secondary | ICD-10-CM

## 2011-07-15 DIAGNOSIS — D126 Benign neoplasm of colon, unspecified: Secondary | ICD-10-CM

## 2011-07-15 DIAGNOSIS — G609 Hereditary and idiopathic neuropathy, unspecified: Secondary | ICD-10-CM

## 2011-07-15 DIAGNOSIS — C61 Malignant neoplasm of prostate: Secondary | ICD-10-CM

## 2011-07-15 DIAGNOSIS — I872 Venous insufficiency (chronic) (peripheral): Secondary | ICD-10-CM

## 2011-07-15 DIAGNOSIS — K5730 Diverticulosis of large intestine without perforation or abscess without bleeding: Secondary | ICD-10-CM

## 2011-07-15 DIAGNOSIS — E119 Type 2 diabetes mellitus without complications: Secondary | ICD-10-CM

## 2011-07-15 DIAGNOSIS — M545 Low back pain: Secondary | ICD-10-CM

## 2011-07-15 DIAGNOSIS — D649 Anemia, unspecified: Secondary | ICD-10-CM

## 2011-07-15 DIAGNOSIS — N259 Disorder resulting from impaired renal tubular function, unspecified: Secondary | ICD-10-CM

## 2011-07-15 DIAGNOSIS — I10 Essential (primary) hypertension: Secondary | ICD-10-CM

## 2011-07-15 NOTE — Patient Instructions (Signed)
Today we updated your med list in our EPIC system...    Continue your current medications the same...  You have gained 10#- nice job, now just maintain where you are...  Stay as active as possible...  Call for any questions...  Let's plan a follow up visit in 3 months w/ CXR & FASTING blood work.Marland KitchenMarland Kitchen

## 2011-07-19 ENCOUNTER — Encounter: Payer: Self-pay | Admitting: Pulmonary Disease

## 2011-07-19 NOTE — Progress Notes (Signed)
Subjective:    Patient ID: Shane [name], male    DOB: [birth year], 78 y.o.   MRN: [id number]  HPI 78 y/o BM here for a follow up visit... he has mult med problems including HBP; AS; ASPVD; DM; Divertics & Colon polyps; Renal insuffic; Prostate cancer; DJD; Anemia...  ~  September 16, 2010:  251mo ROV, now 78 y/o & he's been married 83yrs> states he's feeling OK w/o new complaints or concerns...    CV:  BP controlled on his 3 meds= 136/68 & he denies CP, palpit, SOB, swelling;  takes 81mg  ASA daily & denies ischemic symptoms & feet are doing satis.    DM:  weight is back up 9# to 138# today;  on MetformBid & BS=96, A1c=7.3 today.    Renal:  Creat range 1.5-2.0 over the last several yrs;  labs today w/ BUN=22, Creat=1.8;  hx prostate cancer on Flomax from DrPeterson (PSA=1.9 Sep11).    Anemia:  Hg range 9.8-11.3 over the last several yrs;  today Hg=10.2, MCV=85, Fe=39 (13%), Stool negx6, B12=236, Folate=norm, SPE=neg...  REC> FeSO4 + VitC + oral B12 trial.  ~  April 13, 2011:  29mo ROV & Shane [name] states he's about the same but looks somewhat weaker, more feeble; his CC is his feet- he has mod Tinea Pedis & we reviewed treatment protocol w/ mild soapy water, gauze wipes, then Clotrimazole 1% cream & cotton betw toes >> SEE PROB LIST BELOW:    HBP>  On Nifedipine30 & Losartan25 w/ BP today= 180/90; we called Pharm & his great niece works there> she confirms fairly regular filling rx & pick ups but pt didn't bring bottles or list to office today; we decided to check labs> (see below); and adjust meds> incr Losartan to 50mg /d & add Atenolol 50mg /d.    AS>  On ASA81 + Imdur30 as well; denies CP, palpit, dizzy, syncope, ch in edema; states he is still picking up cans to recycle.    DM>  On Metform500Bid; Labs today showed BS=108, A1c=6.8; continue same, incr fluid intake...    RI>  AbdSonar 9/11 showed medical renal dis, no hydroneph etc; labs today showed BUN=29, Creat=1.7; rec incr fluid intake...    Prostate Ca>   Followed by DrPeterson & his note of 6/12 reviewed> XRT treatment in 1995, PSAs ~2 w/o much change, fairly good flow, nocturia x1, on Flomax.    Anemia>  On FeSO4, VitC, B12 1044mcg/d since 3/12; f/u labs revealed Hg=10.3, MCV=86, B12=1500; rec continue Fe/ VitC/ ok to decr B12 to 1/2...  ~  July 15, 2011:  51mo ROV & his tinea pedis is much improved/ resolved w/ Clotrimazole 1% cream & treatment protocol;  He is c/o poor energy but wt is up 11# to 151# today;  BP much better controlled on & denies CP, palpit, dizzy, syncope, SOB, or incr edema;  DM stable on diet + Metform;  GI & GU appear stable as well...  We reviewed prev labs & decided to f/u in 3 mo w/ CXR & fasting blood work...             Problem List:  DENTAL CARIES (ICD-521.00) - this needs attention from his dentist & he promises to see him soon for f/u...  HYPERTENSION (ICD-401.9) - on ATENOLOL 50mg /d, PROCARDIA XL 30mg /d & COZAAR 25mg /d... ~  cath 1996 w/ norm coronaries, & Cardiolite 2001 was neg... ~  CXR 4/10 showed norm heart, clear lungs... ~  CXR 9/11 showed >> he forgot to  get his CXR today! ~  10/12: BP elevated at OV today 180/90 & we decided to increase Losartan50 & add Atenolol50... ~  1/13:  BP= 116/78 on Aten50, Proc30, Losar25...  AORTIC STENOSIS (ICD-424.1) - on ASA 81mg  + IMDUR 30mg /d... denies CP, palpit, dizzy or syncope (he was hosp in 2001 for syncope)... he walks regularly and picks up cans for recycle "It's bread and gas money"... he knows to stay well hydrated. ~  2DEcho 4/05 showed mod Ao sclerosis & mild Ao root dil, mod asymmetric LVH...  ~  2DEcho 5/09 showed mod incr LVwall thickness w/ focal basal septal hypertropy & end-sys mid cavity oblit, mod calcif AoV c/w mild AS, mild asc Ao dil...  PERIPHERAL VASCULAR DISEASE (ICD-443.9) - on ASA 81mg /d... ArtDopplers of LE's in 2001 w/ calcif vessels and decr toe pressures but norm ABI's... he knows to avoid trauma, inspect feet regularly & watch for  pressure sores etc> he has Tinea Pedis & we reviewed treatment protocol. ~  3/11:  sm lesion dorsum left foot> sent to wound care clinic & slowly resolved... ~  9/12:  Mod tinea pedis left>right & we decided to treat w/ Clotrimazole cream...  VENOUS INSUFFICIENCY (ICD-459.81) - tr edema & he knows to elim salt, elevate legs, wear support hose...  DIABETES MELLITUS (ICD-250.00) - on METFORMIN 500mg Bid... ~  labs 10/08 showed BS=107, HgA1c=6.8.[name]. note BUN=25, Creat=1.7.Marland KitchenMarland Kitchen no DM retinopathy per ophthal. ~  labs 4/09 showed BS= 142, HgA1c= 7.0.[name]. he didn't want to add more meds. ~  labs 10/09 showed BS= 118, HgA1c= 6.4 ~  labs 4/10 showed BS= 100, HgA1c= 6.7 ~  labs 10/10 showed BS= 121, A1c= 6.3 ~  labs 4/11 showed BS= 95, A1c= 6.8 ~  labs 9/11 showed BS= 96, A1c= 7.0 ~  Labs 3/12 on Metform500Bid (wt=137#) showed BS=96, A1c=7.3 ~  Labs 10/12 on Metform500Bid (wt=140#) showed BS=108, A1c=6.8  DIVERTICULOSIS OF COLON (ICD-562.10) - he notes occas constip & we discussed Miralax/ Senakot-S.  COLONIC POLYPS (ICD-211.3) - last colonoscopy 9/99 by DrPatterson was neg without recurrent colon polyps, mild divertics only... ~  9/11: notes some constip improved w/ Senakot- denies blood etc... check stool cards> 6/6 neg.  RENAL INSUFFICIENCY (ICD-588.9) - he has mild renal insuffic w/ BUN= 25, Creat= 1.7 on labs 10/08. ~  labs 4/09 showed BUN= 23, Creat= 1.8 ~  labs 10/09 showed BUN= 30, Creat= 2.0 ~  labs 4/10 showed BUN= 27, Creat= 1.6 ~  labs 10/10 showed BUN= 29, Creat= 1.5 ~  labs 4/11 showed BUN= 20, Creat= 1.7 ~  labs 9/11 showed BUN= 26, Creat= 1.5, AbdSonar w/ incr echogenicity c/w med renal dis, no hydroneph. ~  Labs 3/12 showed BUN= 22, Creat= 1.8 ~  Labs 10/12 showed BUN= 29, Creat= 1.7  ADENOCARCINOMA, PROSTATE (ICD-185) - on FLOMAX 0.4mg /d from DrPeterson...  ~  10/10:  pt reports being seen by DrPeterson 3-4 mo ago & doing well by his report. ~  9/11:  labs showed PSA= 1.90 ~   6/12:  Last note from DrPeterson; stable, XRT treatment in 1995, PSAs ~2 w/o much change, fairly good flow, nocturia x1, on Flomax.  Hx of LOW BACK PAIN, CHRONIC (ICD-724.2) - s/p eval from DrDuda for ortho...  SYNCOPE, HX OF (ICD-V12.49)  PERIPHERAL NEUROPATHY (ICD-356.9) - off prev Neurontin Rx...  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 ~  labs 2008 showed Hg= 11.9 ~  labs 4/09 showed  Hg= 11.1 ~  labs 10/09 showed Hg= 10.7 ~  labs 4/10 showed Hg= 11.3 ~  labs 10/10 showed Hg= 10.8, MCV= 89 ~  labs 4/11 showed Hg= 10.4, MCV= 83 ~  labs 9/11 showed Hg= 9.8, MCV= 87, stool cards neg x6... ~  Labs 3/12 showed Hg= 10.2, MCV= 85, Fe= 39 (13%sat), B12=236, Folate>25, SPE=neg ~  Labs 10/12 showed Hg= 10.3, MCV= 86, B12= 1500; continue Fe, ok to cut B12 to 1/2...   No past surgical history on file.   Outpatient Encounter Prescriptions as of 07/15/2011  Medication Sig Dispense Refill  . aspirin 325 MG tablet Take 325 mg by mouth daily.        [name] atenolol (TENORMIN) 50 MG tablet Take 1 tablet (50 mg total) by mouth daily.  30 tablet  11  . clotrimazole (LOTRIMIN) 1 % cream Apply topically 2 (two) times daily. To toes  30 g  5  . Ferrous Sulfate Dried (FEOSOL) 200 (65 FE) MG TABS Take 1 tablet by mouth daily. Take with vitamin c 500mg  daily       . isosorbide mononitrate (IMDUR) 30 MG 24 hr tablet Take one (1) tablet(s) once daily  30 tablet  6  . losartan (COZAAR) 25 MG tablet Take 2 tablets (50 mg total) by mouth daily.  60 tablet  11  . metFORMIN (GLUCOPHAGE) 500 MG tablet Take 1 tablet (500 mg total) by mouth 2 (two) times daily with a meal.  60 tablet  11  . NIFEdipine (PROCARDIA XL/ADALAT-CC) 30 MG 24 hr tablet Take 30 mg by mouth daily.        . nitroGLYCERIN (NITROSTAT) 0.4 MG SL tablet Place 0.4 mg under the tongue every 5 (five) minutes as needed.        . senna-docusate (SENOKOT-S) 8.6-50 MG per tablet Take  1 tablet by mouth daily.        . Tamsulosin HCl (FLOMAX) 0.4 MG CAPS Take 0.4 mg by mouth daily after supper.        . vitamin B-12 (CYANOCOBALAMIN) 500 MCG tablet Take 500 mcg by mouth daily.        . magnesium citrate (BL MAGNESIUM CITRATE) 1.745 GM/30ML SOLN As needed for constipation       . DISCONTD: aspirin 81 MG tablet Take 81 mg by mouth daily.        [name] DISCONTD: vitamin B-12 (CYANOCOBALAMIN) 1000 MCG tablet Take 1,000 mcg by mouth daily.          No Known Allergies   Current Medications, Allergies, Past Medical History, Past Surgical History, Family History, and Social History were reviewed in [employer] record.    Review of Systems        See HPI - all other systems neg except as noted...  The patient complains of dyspnea on exertion.  The patient denies anorexia, fever, weight loss, weight gain, vision loss, decreased hearing, hoarseness, chest pain, syncope, peripheral edema, prolonged cough, headaches, hemoptysis, abdominal pain, melena, hematochezia, severe indigestion/heartburn, hematuria, incontinence, muscle weakness, suspicious skin lesions, transient blindness, difficulty walking, depression, unusual weight change, abnormal bleeding, enlarged lymph nodes, and angioedema.     Objective:   Physical Exam    WD, WN, 78 y/o BM in NAD... GENERAL:  Alert & oriented; pleasant & cooperative... HEENT:  [place]/AT, EOM-wnl, PERRLA, EACs-clear, TMs-wnl, NOSE-clear, THROAT-clear & wnl; severe dental caries... NECK:  Supple w/ decrROM; no JVD; normal carotid impulses w/o bruits; no thyromegaly or nodules palpated; no  lymphadenopathy. CHEST:  Clear to P & A; without wheezes/ rales/ or rhonchi. HEART:  Regular Rhythm;  gr 2/6 SEM without rubs or gallops heard... ABDOMEN:  Soft & nontender; normal bowel sounds; no organomegaly or masses detected. EXT: without deformities, mod arthritic changes (esp neck) no varicose veins/ +venous insuffic/ no edema. Scar on  dorsum left foot, no open area, no drainage, etc... NEURO:  CN's intact;  no focal neuro deficits... touch sensation intact in feet. DERM:  Tinea pedis + dry skin LE's...   Assessment & Plan:   HBP>  On Atenolol50, Nifedipine30 & Losartan50 w/ BP today= 116/78; much improved w/ prev med adjustment; continue same for now...  AS>  On ASA81 + Imdur30 as well; denies CP, palpit, dizzy, syncope, ch in edema; states he is still picking up cans to recycle.  ASPVD>  On ASA, he denies claudication symptoms; prev tinea resolved...  Ven Insuffic>  Prev mod tinea pedis left>right resolved w/ Clotrimazole cream...  DM>  On Metform500Bid; Labs 10/12 showed BS=108, A1c=6.8; continue same, incr fluid intake...  GI> Divertics, Polyps>  On Miralax, Senakot; regular BMs if he takes these regularly...  Renal Insuffic>  Stable w/ Creat= 1.7, advised on nutrition & incr oral fluids...  Prostate Cancer>  Followed by DrPeterson & his note of 6/12 reviewed> XRT treatment in 1995, PSAs ~2 w/o much change, fairly good flow, nocturia x1, on Flomax.  LBP>  He manages fairly well & gets plenty of exercise...  Anemia>  On FeSO4, VitC, B12 1063mcg/d since 3/12; f/u labs revealed Hg=10.3, MCV=86, B12=1500; rec continue Fe/ VitC/ ok to decr B12 to 1/2.Marland KitchenMarland Kitchen

## 2011-08-08 ENCOUNTER — Emergency Department (HOSPITAL_COMMUNITY)
Admission: EM | Admit: 2011-08-08 | Discharge: 2011-08-08 | Disposition: A | Discharge status: Home / Self Care | Payer: Medicare Other | Attending: Emergency Medicine | Admitting: Emergency Medicine

## 2011-08-08 ENCOUNTER — Emergency Department (HOSPITAL_COMMUNITY): Payer: Medicare Other

## 2011-08-08 ENCOUNTER — Encounter (HOSPITAL_COMMUNITY): Payer: Self-pay | Admitting: *Deleted

## 2011-08-08 DIAGNOSIS — H539 Unspecified visual disturbance: Secondary | ICD-10-CM | POA: Insufficient documentation

## 2011-08-08 DIAGNOSIS — G8929 Other chronic pain: Secondary | ICD-10-CM | POA: Insufficient documentation

## 2011-08-08 DIAGNOSIS — H547 Unspecified visual loss: Secondary | ICD-10-CM | POA: Insufficient documentation

## 2011-08-08 DIAGNOSIS — I10 Essential (primary) hypertension: Secondary | ICD-10-CM | POA: Insufficient documentation

## 2011-08-08 DIAGNOSIS — Z7982 Long term (current) use of aspirin: Secondary | ICD-10-CM | POA: Insufficient documentation

## 2011-08-08 DIAGNOSIS — Z79899 Other long term (current) drug therapy: Secondary | ICD-10-CM | POA: Insufficient documentation

## 2011-08-08 DIAGNOSIS — R42 Dizziness and giddiness: Secondary | ICD-10-CM | POA: Insufficient documentation

## 2011-08-08 DIAGNOSIS — E119 Type 2 diabetes mellitus without complications: Secondary | ICD-10-CM | POA: Insufficient documentation

## 2011-08-08 DIAGNOSIS — M545 Low back pain, unspecified: Secondary | ICD-10-CM | POA: Insufficient documentation

## 2011-08-08 DIAGNOSIS — I739 Peripheral vascular disease, unspecified: Secondary | ICD-10-CM | POA: Insufficient documentation

## 2011-08-08 LAB — URINALYSIS, ROUTINE W REFLEX MICROSCOPIC
Bilirubin Urine: NEGATIVE
Nitrite: NEGATIVE
Protein, ur: 30 mg/dL — AB
Specific Gravity, Urine: 1.018 (ref 1.005–1.030)
Urobilinogen, UA: 0.2 mg/dL (ref 0.0–1.0)

## 2011-08-08 LAB — COMPREHENSIVE METABOLIC PANEL
ALT: 9 U/L (ref 0–53)
CO2: 24 mEq/L (ref 19–32)
Calcium: 9.3 mg/dL (ref 8.4–10.5)
Creatinine, Ser: 1.95 mg/dL — ABNORMAL HIGH (ref 0.50–1.35)
GFR calc Af Amer: 33 mL/min — ABNORMAL LOW (ref >90)
GFR calc non Af Amer: 28 mL/min — ABNORMAL LOW (ref >90)
Glucose, Bld: 129 mg/dL — ABNORMAL HIGH (ref 70–99)

## 2011-08-08 LAB — CBC
Hemoglobin: 9.8 g/dL — ABNORMAL LOW (ref 13.0–17.0)
MCH: 27.4 pg (ref 26.0–34.0)
MCV: 84.6 fL (ref 78.0–100.0)
RBC: 3.58 MIL/uL — ABNORMAL LOW (ref 4.22–5.81)

## 2011-08-08 NOTE — ED Notes (Addendum)
Pt c/o new onset of blurred vision and dizziness while sitting down x1 on Wednesday another episode occurred @ 0630 this am, pt reports weakness and heaviness associated s/s. Pt states "it's like a sheet covered my eyes." No neuro deficits noted, pt denies N/V, chest/back/abd pain, or sob

## 2011-08-08 NOTE — ED Notes (Signed)
Dr.Campos given copy of ecg.

## 2011-08-08 NOTE — ED Notes (Signed)
Reports having episode of dizziness on wed which resolved, again today felt dizzy and had temp change in vision, started at approx 0630 and now symptoms are resolving. No neuro deficits noted at triage.

## 2011-08-08 NOTE — ED Provider Notes (Signed)
History     CSN: [id number]  Arrival date & time 08/08/11  1242   First MD Initiated Contact with Patient 08/08/11 1326      Chief Complaint  Patient presents with  . Dizziness     HPI The patient reports several episodes of intermittent dizziness Wednesday which resolved.  Today he had an episode of temporary loss of vision in his bilateral eyes that started approximately 6:30 and now his symptoms have resolved.  At this time he denies visual changes.  He denies weakness of his upper lower extremities.  His never had these symptoms before.  He denies a history of stroke.  He reports his dizziness r spinning around and round.  He does not describe ataxia.  He does not describe lightheadedness.  He denies nausea vomiting diarrhea.  He denies chest pain palpitations.  Denies shortness of breath.  His family reports baseline mental status for him.   Past Medical History  Diagnosis Date  . Dental caries   . Hypertension   . Aortic stenosis   . Peripheral vascular disease   . Venous insufficiency   . Diabetes mellitus   . Diverticulosis of colon   . History of colonic polyps   . Renal insufficiency   . Adenocarcinoma of prostate   . Chronic low back pain   . History of syncope   . Peripheral neuropathy   . Sebaceous cyst   . Anemia     History reviewed. No pertinent past surgical history.  History reviewed. No pertinent family history.  History  Substance Use Topics  . Smoking status: Former Smoker    Types: Cigars    Quit date: 07/13/1978  . Smokeless tobacco: Not on file   Comment: for 2-3 years  . Alcohol Use: No      Review of Systems  All other systems reviewed and are negative.    Allergies  Review of patient's allergies indicates no known allergies.  Home Medications   Current Outpatient Rx  Name Route Sig Dispense Refill  . ASPIRIN 325 MG PO TABS Oral Take 325 mg by mouth daily.      . ATENOLOL 50 MG PO TABS Oral Take 1 tablet (50 mg total) by  mouth daily. 30 tablet 11  . CLOTRIMAZOLE 1 % EX CREA Topical Apply topically 2 (two) times daily. To toes 30 g 5  . FERROUS SULFATE DRIED 200 (65 FE) MG PO TABS Oral Take 1 tablet by mouth daily. Take with vitamin c 500mg  daily     . ISOSORBIDE MONONITRATE ER 30 MG PO TB24  Take one (1) tablet(s) once daily 30 tablet 6  . LOSARTAN POTASSIUM 25 MG PO TABS Oral Take 2 tablets (50 mg total) by mouth daily. 60 tablet 11  . MAGNESIUM CITRATE 1.745 GM/30ML PO SOLN  As needed for constipation     . METFORMIN HCL 500 MG PO TABS Oral Take 1 tablet (500 mg total) by mouth 2 (two) times daily with a meal. 60 tablet 11  . NIFEDIPINE ER OSMOTIC 30 MG PO TB24 Oral Take 30 mg by mouth daily.      Bernadette Hoit SODIUM 8.6-50 MG PO TABS Oral Take 1 tablet by mouth daily.      [name] TAMSULOSIN HCL 0.4 MG PO CAPS Oral Take 0.4 mg by mouth daily after supper.      [name] VITAMIN B-12 500 MCG PO TABS Oral Take 500 mcg by mouth daily.      [name] NITROGLYCERIN 0.4  MG SL SUBL Sublingual Place 0.4 mg under the tongue every 5 (five) minutes as needed.        BP 153/62  Pulse 44  Temp(Src) 98 F (36.7 C) (Oral)  Resp 20  SpO2 97%  Physical Exam  Nursing note and vitals reviewed. Constitutional: He is oriented to person, place, and time. He appears well-developed and well-nourished.  HENT:  Head: Normocephalic and atraumatic.  Right Ear: External ear normal.  Left Ear: External ear normal.       Vision is bilateral eyes is 20/30 bilaterally.  His pupils are 3 mm are reactive bilaterally.  His conjunctiva are normal bilaterally.  His no exudates or drainage.  His extraocular movements are intact.  There is no horizontal or vertical nystagmus  Eyes: Conjunctivae and EOM are normal. Pupils are equal, round, and reactive to light.  Neck: Normal range of motion.  Cardiovascular: Normal rate, regular rhythm, normal heart sounds and intact distal pulses.   Pulmonary/Chest: Effort normal and breath sounds normal. No  respiratory distress.  Abdominal: Soft. He exhibits no distension. There is no tenderness.  Musculoskeletal: Normal range of motion.  Neurological: He is alert and oriented to person, place, and time.  Skin: Skin is warm and dry.  Psychiatric: He has a normal mood and affect. Judgment normal.    ED Course  Procedures (including critical care time)  Labs Reviewed  GLUCOSE, CAPILLARY - Abnormal; Notable for the following:    Glucose-Capillary 121 (*)    All other components within normal limits  CBC - Abnormal; Notable for the following:    RBC 3.58 (*)    Hemoglobin 9.8 (*)    HCT 30.3 (*)    RDW 15.7 (*)    All other components within normal limits  COMPREHENSIVE METABOLIC PANEL - Abnormal; Notable for the following:    Glucose, Bld 129 (*)    BUN 26 (*)    Creatinine, Ser 1.95 (*)    Albumin 3.1 (*)    GFR calc non Af Amer 28 (*)    GFR calc Af Amer 33 (*)    All other components within normal limits    Date: 08/08/2011  Rate: 52  Rhythm: sinus bradycardia  QRS Axis: normal  Intervals: normal  ST/T Wave abnormalities: normal  Conduction Disutrbances:none  Narrative Interpretation:   Old EKG Reviewed: No significant changes noted   Mr Brain Wo Contrast  08/08/2011  *RADIOLOGY REPORT*  Clinical Data: 78 year old male with sudden vision loss.  MRI HEAD WITHOUT CONTRAST  Technique:  Multiplanar, multiecho pulse sequences of the brain and surrounding structures were obtained according to standard protocol without intravenous contrast.  Comparison: None.  Findings: Cerebral volume loss with ex vacuo ventricular enlargement. No restricted diffusion to suggest acute infarction. No midline shift, mass effect, evidence of mass lesion, extra-axial collection or acute intracranial hemorrhage.  Cervicomedullary junction and pituitary are within normal limits.  Major intracranial vascular flow voids are preserved.  Confluent periventricular white matter T2 and FLAIR hyperintensity. No  definite cortical encephalomalacia.  Negative for age visualized cervical spine. Visualized bone marrow signal is within normal limits.  Postoperative changes to both globes.  Otherwise negative orbit soft tissues.  Fluid signal in the right sphenoid and posterior left ethmoid sinuses.  Other Visualized paranasal sinuses and mastoids are clear.  Negative scalp soft tissues.  IMPRESSION: 1. No acute intracranial abnormality. 2.  Ventricular prominence felt to be ex vacuo related. 3.  Mild ethmoid and sphenoid sinus inflammatory changes.  Original Report Authenticated By: Ulla Potash III, M.D.     1. Transient visual disturbance, bilateral       MDM  His BUN and creatinine as well as his hemoglobin are baseline for him.  His symptoms could represent TIA thus MRI scanner be obtained at this time.  His pulse is 53 but this is a sinus bradycardia.  MRI scan of his brain is pending at this time.  Given his age if the MRI scan is negative i will dc the patient home on his ASA 325 mg daily.  He'll be referred to neurology for further evaluation.  His symptoms are in his bilateral eyes.  His vision has returned to normal.  This does not appear to be an  acute ocular event.  He has no obvious carotid bruits on exam.  I don't think the patient would benefit from a carotid duplex as his comorbidities are so profound he would not be a candidate for carotid endarterectomy.  The patient and family understand this.  The patient will continue his care in the clinical decision unit where he will await his MRI scan.       Lyanne Co, MD 08/09/11 2050

## 2011-08-08 NOTE — ED Notes (Signed)
Call pt's friend/neighbor prior to being d/c'd to home - Shane [name] - [phone] and home [phone].

## 2011-08-08 NOTE — ED Notes (Signed)
Pt and spouse to continue to insist on being d/c'd to home.  Encouraged spouse to drive rather than pt.

## 2011-08-11 ENCOUNTER — Ambulatory Visit: Payer: Medicare Other | Admitting: Pulmonary Disease

## 2011-08-11 ENCOUNTER — Encounter: Payer: Self-pay | Admitting: Pulmonary Disease

## 2011-08-11 ENCOUNTER — Ambulatory Visit (INDEPENDENT_AMBULATORY_CARE_PROVIDER_SITE_OTHER): Payer: Medicare Other | Admitting: Pulmonary Disease

## 2011-08-11 DIAGNOSIS — G609 Hereditary and idiopathic neuropathy, unspecified: Secondary | ICD-10-CM

## 2011-08-11 DIAGNOSIS — C61 Malignant neoplasm of prostate: Secondary | ICD-10-CM

## 2011-08-11 DIAGNOSIS — I359 Nonrheumatic aortic valve disorder, unspecified: Secondary | ICD-10-CM

## 2011-08-11 DIAGNOSIS — I10 Essential (primary) hypertension: Secondary | ICD-10-CM

## 2011-08-11 DIAGNOSIS — E119 Type 2 diabetes mellitus without complications: Secondary | ICD-10-CM

## 2011-08-11 DIAGNOSIS — Z8669 Personal history of other diseases of the nervous system and sense organs: Secondary | ICD-10-CM

## 2011-08-11 DIAGNOSIS — I739 Peripheral vascular disease, unspecified: Secondary | ICD-10-CM

## 2011-08-11 DIAGNOSIS — R42 Dizziness and giddiness: Secondary | ICD-10-CM

## 2011-08-11 DIAGNOSIS — N259 Disorder resulting from impaired renal tubular function, unspecified: Secondary | ICD-10-CM

## 2011-08-11 MED ORDER — MECLIZINE HCL 25 MG PO TABS: 25.0000 mg | ORAL_TABLET | Freq: Four times a day (QID) | ORAL | Status: DC | PRN

## 2011-08-11 NOTE — Patient Instructions (Signed)
Today we updated your med list in our EPIC system...    We decreased the Losartan to 25mg  one tab daily & we stopped the Tamsulosin...    We added MECLIZINE 25mg  - 1/2 to 1 tab every 6H as needed for dizziness...  We will schedule a 2DEchocardiogram and Carotid doppler's [facility] [address]...    We will call you w/ the results...  Call for any questions...  Keep your prev sched follow up appt in 2 months 10/13/11 at [facility]..[name]

## 2011-08-11 NOTE — Progress Notes (Signed)
Subjective:    Patient ID: Shane [name], male    DOB: [birth year], 78 y.o.   MRN: [id number]  HPI 78 y/o BM here for a follow up visit... he has mult med problems including HBP; AS; ASPVD; DM; Divertics & Colon polyps; Renal insuffic; Prostate cancer; DJD; Anemia...  ~  September 16, 2010:  72mo ROV, now 78 y/o & he's been married 38yrs> states he's feeling OK w/o new complaints or concerns...    CV:  BP controlled on his 3 meds= 136/68 & he denies CP, palpit, SOB, swelling;  takes 81mg  ASA daily & denies ischemic symptoms & feet are doing satis.    DM:  weight is back up 9# to 138# today;  on MetformBid & BS=96, A1c=7.3 today.    Renal:  Creat range 1.5-2.0 over the last several yrs;  labs today w/ BUN=22, Creat=1.8;  hx prostate cancer on Flomax from DrPeterson (PSA=1.9 Sep11).    Anemia:  Hg range 9.8-11.3 over the last several yrs;  today Hg=10.2, MCV=85, Fe=39 (13%), Stool negx6, B12=236, Folate=norm, SPE=neg...  REC> FeSO4 + VitC + oral B12 trial.  ~  April 13, 2011:  77mo ROV & Doye states he's about the same but looks somewhat weaker, more feeble; his CC is his feet- he has mod Tinea Pedis & we reviewed treatment protocol w/ mild soapy water, gauze wipes, then Clotrimazole 1% cream & cotton betw toes >> SEE PROB LIST BELOW:    HBP>  On Nifedipine30 & Losartan25 w/ BP today= 180/90; we called Pharm & his great niece works there> she confirms fairly regular filling rx & pick ups but pt didn't bring bottles or list to office today; we decided to check labs> (see below); and adjust meds> incr Losartan to 50mg /d & add Atenolol 50mg /d.    AS>  On ASA81 + Imdur30 as well; denies CP, palpit, dizzy, syncope, ch in edema; states he is still picking up cans to recycle.    DM>  On Metform500Bid; Labs today showed BS=108, A1c=6.8; continue same, incr fluid intake...    RI>  AbdSonar 9/11 showed medical renal dis, no hydroneph etc; labs today showed BUN=29, Creat=1.7; rec incr fluid intake...    Prostate Ca>   Followed by DrPeterson & his note of 6/12 reviewed> XRT treatment in 1995, PSAs ~2 w/o much change, fairly good flow, nocturia x1, on Flomax.    Anemia>  On FeSO4, VitC, B12 1081mcg/d since 3/12; f/u labs revealed Hg=10.3, MCV=86, B12=1500; rec continue Fe/ VitC/ ok to decr B12 to 1/2...  ~  July 15, 2011:  84mo ROV & his tinea pedis is much improved/ resolved w/ Clotrimazole 1% cream & treatment protocol;  He is c/o poor energy but wt is up 11# to 151# today;  BP much better controlled on & denies CP, palpit, dizzy, syncope, SOB, or incr edema;  DM stable on diet + Metform;  GI & GU appear stable as well...  We reviewed prev labs & decided to f/u in 3 mo w/ CXR & fasting blood work...  ~  August 11, 2011:  32mo ROV & post ER check> Loney walked into the office today requesting a post ER check; Records show that he went to the ER 08/09/11 w/ dizziness & transient visual obscuration described as "a skim over my eye"; he felt weak, sleepy, dizzy, speech ok, no HA, no focal neuro symptoms & exam was essent neg;  He was rec to continue his ASA 325mg /d & f/u w/ [place].Marland KitchenMarland Kitchen  LABS: Hg=9.8, WBC=5.0, BUN/Creat=26/1.95, BS=129...  EKG: SBrady, rate52, NSSTTWA, NAD...  MRI Brain: no acute abnormality, prominent ventricles, mild sinus inflamm changes... Recall hx of HBP w/ poor med compliance complicating his care, Hx AS w/ last 2DEcho 2009, known ASPVD (see below); Since the ER he has been stable, but has mult somatic complaints;  After careful review & discussion w/ pt & wife- we decided to pursue further eval w/ repeat 2DEcho and CDopplers; we will treat w/ Meclizine 25mg - 1/2 to 1 tab po Q6H as needed...          Problem List:  DENTAL CARIES (ICD-521.00) - this needs attention from his dentist & he promises to see him soon for f/u...  HYPERTENSION (ICD-401.9) - on ATENOLOL 50mg /d, PROCARDIA XL 30mg /d & COZAAR 25mg /d... ~  cath 1996 w/ norm coronaries, & Cardiolite 2001 was neg... ~  CXR 4/10 showed  norm heart, clear lungs... ~  CXR 9/11 showed >> he forgot to get his CXR today! ~  10/12: BP elevated at OV today 180/90 & we decided to increase Losartan50 & add Atenolol50... ~  1/13:  BP= 116/78 on Aten50, Proc30, Losar25... There is a small postural drop on standing but no dizziness reported.  AORTIC STENOSIS (ICD-424.1) - on ASA 325mg  (incr from 81mg  via ER 1/13)+ IMDUR 30mg /d... denies CP, palpit, dizzy or syncope (he was hosp in 2001 for syncope)... he walks regularly and picks up cans for recycle "It's bread and gas money"... he knows to stay well hydrated. ~  2DEcho 4/05 showed mod Ao sclerosis & mild Ao root dil, mod asymmetric LVH...  ~  2DEcho 5/09 showed mod incr LVwall thickness w/ focal basal septal hypertropy & end-sys mid cavity oblit, mod calcif AoV c/w mild AS, mild asc Ao dil... ~  2DEcho 1/13 ==> pending  PERIPHERAL VASCULAR DISEASE (ICD-443.9) - on ASA 325mg /d now... ArtDopplers of LE's in 2001 w/ calcif vessels and decr toe pressures but norm ABI's... he knows to avoid trauma, inspect feet regularly & watch for pressure sores etc> he has Tinea Pedis & we reviewed treatment protocol. ~  3/11:  sm lesion dorsum left foot> sent to wound care clinic & slowly resolved... ~  9/12:  Mod tinea pedis left>right & we decided to treat w/ Clotrimazole cream... ~  1/13:  CDopplers ==> pending  VENOUS INSUFFICIENCY (ICD-459.81) - tr edema & he knows to elim salt, elevate legs, wear support hose...  DIABETES MELLITUS (ICD-250.00) - on METFORMIN 500mg Bid... ~  labs 10/08 showed BS=107, HgA1c=6.8.[name]. note BUN=25, Creat=1.7.Marland KitchenMarland Kitchen no DM retinopathy per ophthal. ~  labs 4/09 showed BS= 142, HgA1c= 7.0.[name]. he didn't want to add more meds. ~  labs 10/09 showed BS= 118, HgA1c= 6.4 ~  labs 4/10 showed BS= 100, HgA1c= 6.7 ~  labs 10/10 showed BS= 121, A1c= 6.3 ~  labs 4/11 showed BS= 95, A1c= 6.8 ~  labs 9/11 showed BS= 96, A1c= 7.0 ~  Labs 3/12 on Metform500Bid (wt=137#) showed BS=96, A1c=7.3 ~   Labs 10/12 on Metform500Bid (wt=140#) showed BS=108, A1c=6.8  DIVERTICULOSIS OF COLON (ICD-562.10) - he notes occas constip & we discussed Miralax/ Senakot-S.  COLONIC POLYPS (ICD-211.3) - last colonoscopy 9/99 by DrPatterson was neg without recurrent colon polyps, mild divertics only... ~  9/11: notes some constip improved w/ Senakot- denies blood etc... check stool cards> 6/6 neg.  RENAL INSUFFICIENCY (ICD-588.9) - he has mild renal insuffic w/ BUN= 25, Creat= 1.7 on labs 10/08. ~  labs 4/09 showed BUN= 23, Creat= 1.8 ~  labs 10/09 showed BUN= 30, Creat= 2.0 ~  labs 4/10 showed BUN= 27, Creat= 1.6 ~  labs 10/10 showed BUN= 29, Creat= 1.5 ~  labs 4/11 showed BUN= 20, Creat= 1.7 ~  labs 9/11 showed BUN= 26, Creat= 1.5, AbdSonar w/ incr echogenicity c/w med renal dis, no hydroneph. ~  Labs 3/12 showed BUN= 22, Creat= 1.8 ~  Labs 10/12 showed BUN= 29, Creat= 1.7 ~  Labs 1/13 from ER showed BUN= 26, Creat= 1.95  ADENOCARCINOMA, PROSTATE (ICD-185) - prev on Flomax from DrPeterson...  ~  10/10:  pt reports being seen by DrPeterson 3-4 mo ago & doing well by his report. ~  9/11:  labs showed PSA= 1.90 ~  6/12:  Last note from DrPeterson; stable, XRT treatment in 1995, PSAs ~2 w/o much change, fairly good flow, nocturia x1, on Flomax. ~  1/13:  Pt ran out/ stopped Flomax on his own & reports good stream, denies voiding difficulty...  Hx of LOW BACK PAIN, CHRONIC (ICD-724.2) - s/p eval from DrDuda for ortho...  SYNCOPE, HX OF (ICD-V12.49)  PERIPHERAL NEUROPATHY (ICD-356.9) - off prev Neurontin Rx...  SEBACEOUS CYST (ICD-706.2)  CHRONIC ANEMIA (ICD-285.9) >> on FeSO4 + VitC and B12 1059mcg/d supplements since 3/12. ~  labs 2008 showed Hg= 11.9 ~  labs 4/09 showed Hg= 11.1 ~  labs 10/09 showed Hg= 10.7 ~  labs 4/10 showed Hg= 11.3 ~  labs 10/10 showed Hg= 10.8, MCV= 89 ~  labs 4/11 showed Hg= 10.4, MCV= 83 ~  labs 9/11 showed Hg= 9.8, MCV= 87, stool cards neg x6... ~  Labs 3/12 showed  Hg= 10.2, MCV= 85, Fe= 39 (13%sat), B12=236, Folate>25, SPE=neg ~  Labs 10/12 showed Hg= 10.3, MCV= 86, B12= 1500; continue Fe, ok to cut B12 to 1/2... ~  Labs 1/13 in ER showed Hg= 9.8   No past surgical history on file.   Outpatient Encounter Prescriptions as of 08/11/2011  Medication Sig Dispense Refill  . aspirin 325 MG tablet Take 325 mg by mouth daily.        [name] atenolol (TENORMIN) 50 MG tablet Take 1 tablet (50 mg total) by mouth daily.  30 tablet  11  . clotrimazole (LOTRIMIN) 1 % cream Apply topically 2 (two) times daily. To toes  30 g  5  . Ferrous Sulfate Dried (FEOSOL) 200 (65 FE) MG TABS Take 1 tablet by mouth daily. Take with vitamin c 500mg  daily       . isosorbide mononitrate (IMDUR) 30 MG 24 hr tablet Take one (1) tablet(s) once daily  30 tablet  6  . losartan (COZAAR) 25 MG tablet Take 25 mg by mouth daily.      . metFORMIN (GLUCOPHAGE) 500 MG tablet Take 1 tablet (500 mg total) by mouth 2 (two) times daily with a meal.  60 tablet  11  . NIFEdipine (PROCARDIA XL/ADALAT-CC) 30 MG 24 hr tablet Take 30 mg by mouth daily.        . nitroGLYCERIN (NITROSTAT) 0.4 MG SL tablet Place 0.4 mg under the tongue every 5 (five) minutes as needed.        . senna-docusate (SENOKOT-S) 8.6-50 MG per tablet Take 1 tablet by mouth daily.        . vitamin B-12 (CYANOCOBALAMIN) 500 MCG tablet Take 500 mcg by mouth daily.        . magnesium citrate (BL MAGNESIUM CITRATE) 1.745 GM/30ML SOLN As needed for constipation       . meclizine (ANTIVERT) 25  MG tablet Take 1 tablet (25 mg total) by mouth every 6 (six) hours as needed for dizziness or nausea.  50 tablet  2    No Known Allergies   Current Medications, Allergies, Past Medical History, Past Surgical History, Family History, and Social History were reviewed in [employer] record.    Review of Systems        See HPI - all other systems neg except as noted...  The patient complains of dyspnea on exertion.  The patient  denies anorexia, fever, weight loss, weight gain, vision loss, decreased hearing, hoarseness, chest pain, syncope, peripheral edema, prolonged cough, headaches, hemoptysis, abdominal pain, melena, hematochezia, severe indigestion/heartburn, hematuria, incontinence, muscle weakness, suspicious skin lesions, transient blindness, difficulty walking, depression, unusual weight change, abnormal bleeding, enlarged lymph nodes, and angioedema.     Objective:   Physical Exam    WD, WN, 78 y/o BM in NAD; weak, chr ill appearing... GENERAL:  Alert & oriented; pleasant & cooperative... HEENT:  [place]/AT, EOM-wnl, PERRLA, EACs-clear, TMs-wnl, NOSE-clear, THROAT-clear & wnl; severe dental caries... NECK:  Supple w/ decrROM; no JVD; normal carotid impulses +bruit vs transmit murmur on right; no thyromegaly or nodules palpated; no lymphadenopathy. CHEST:  Clear to P & A; without wheezes/ rales/ or rhonchi. HEART:  Regular Rhythm;  gr 2-3/6 SEM without rubs or gallops heard... ABDOMEN:  Soft & nontender; normal bowel sounds; no organomegaly or masses detected. EXT: without deformities, mod arthritic changes (esp neck) no varicose veins/ +venous insuffic/ no edema. NEURO:  CN's intact;  no focal neuro deficits... touch sensation intact in feet. DERM:  Tinea pedis + dry skin LE's...  RADIOLOGY DATA:  Reviewed in the EPIC EMR & discussed w/ the patient...    >>Last CXR 4/10 showed norm heart size, clear lungs, NAD...  LABORATORY DATA:  Reviewed in the EPIC EMR & discussed w/ the patient...   Assessment & Plan:   HBP>  On Atenolol50, Nifedipine30 & Losartan25 w/ BP today= 118/60 standing (min postural change noted)...  AS>  On ASA81 + Imdur30 as well; denies CP, palpit, syncope, ch in edema; we will proceed w/ f/u 2DEcho...  ASPVD>  On ASA, now 325mg /d, he denies claudication symptoms; prev tinea resolved; we will proceed w/ CDopplers...  Ven Insuffic>  Prev mod tinea pedis left>right resolved w/ Clotrimazole  cream...  DM>  On Metform500Bid; Labs 10/12 showed BS=108, A1c=6.8; continue same, incr fluid intake...  GI> Divertics, Polyps>  On Miralax, Senakot; regular BMs if he takes these regularly...  Renal Insuffic>  Creat up to 1.95 & he is encouraged to incr fluid intake, advised on nutrition etc...  Prostate Cancer>  Followed by DrPeterson & his note of 6/12 reviewed> XRT treatment in 1995, PSAs ~2 w/o much change, fairly good flow, nocturia x1, off Flomax now.  LBP>  He manages fairly well & gets plenty of exercise...  Anemia>  On FeSO4, VitC, B12 1063mcg/d since 3/12; f/u labs revealed Hg=10.3, MCV=86, B12=1500; rec continue Fe/ VitC/ ok to decr B12 to 1/2...   Patient's Medications  New Prescriptions   MECLIZINE (ANTIVERT) 25 MG TABLET    Take 1 tablet (25 mg total) by mouth every 6 (six) hours as needed for dizziness or nausea.  Previous Medications   ASPIRIN 325 MG TABLET    Take 325 mg by mouth daily.     ATENOLOL (TENORMIN) 50 MG TABLET    Take 1 tablet (50 mg total) by mouth daily.   CLOTRIMAZOLE (LOTRIMIN) 1 % CREAM  Apply topically 2 (two) times daily. To toes   FERROUS SULFATE DRIED (FEOSOL) 200 (65 FE) MG TABS    Take 1 tablet by mouth daily. Take with vitamin c 500mg  daily    ISOSORBIDE MONONITRATE (IMDUR) 30 MG 24 HR TABLET    Take one (1) tablet(s) once daily   MAGNESIUM CITRATE (BL MAGNESIUM CITRATE) 1.745 GM/30ML SOLN    As needed for constipation    METFORMIN (GLUCOPHAGE) 500 MG TABLET    Take 1 tablet (500 mg total) by mouth 2 (two) times daily with a meal.   NIFEDIPINE (PROCARDIA XL/ADALAT-CC) 30 MG 24 HR TABLET    Take 30 mg by mouth daily.     NITROGLYCERIN (NITROSTAT) 0.4 MG SL TABLET    Place 0.4 mg under the tongue every 5 (five) minutes as needed.     SENNA-DOCUSATE (SENOKOT-S) 8.6-50 MG PER TABLET    Take 1 tablet by mouth daily.     VITAMIN B-12 (CYANOCOBALAMIN) 500 MCG TABLET    Take 500 mcg by mouth daily.    Modified Medications   Modified Medication  Previous Medication   LOSARTAN (COZAAR) 25 MG TABLET losartan (COZAAR) 25 MG tablet      Take 25 mg by mouth daily.    Take 2 tablets (50 mg total) by mouth daily.  Discontinued Medications   TAMSULOSIN HCL (FLOMAX) 0.4 MG CAPS    Take 0.4 mg by mouth daily after supper.

## 2011-08-14 ENCOUNTER — Other Ambulatory Visit (HOSPITAL_COMMUNITY): Payer: Medicare Other

## 2011-08-19 ENCOUNTER — Telehealth: Payer: Self-pay | Admitting: Pulmonary Disease

## 2011-08-19 NOTE — Telephone Encounter (Signed)
Spoke with pts wife and she stated that the pt lost his papers that was given to him at his last ov with SN.   They missed the appt to have the 2decho done so this has been rescheduled for them to 2-7 at 3:30.  She is also aware of the appt on 2-12 that pt has for carotid doppler study.  Pt to call back for further questions.

## 2011-08-20 ENCOUNTER — Ambulatory Visit (HOSPITAL_COMMUNITY): Payer: Medicare Other | Attending: Cardiovascular Disease | Admitting: Radiology

## 2011-08-20 DIAGNOSIS — I10 Essential (primary) hypertension: Secondary | ICD-10-CM | POA: Insufficient documentation

## 2011-08-20 DIAGNOSIS — I359 Nonrheumatic aortic valve disorder, unspecified: Secondary | ICD-10-CM | POA: Insufficient documentation

## 2011-08-20 DIAGNOSIS — E119 Type 2 diabetes mellitus without complications: Secondary | ICD-10-CM | POA: Insufficient documentation

## 2011-08-20 DIAGNOSIS — Z8669 Personal history of other diseases of the nervous system and sense organs: Secondary | ICD-10-CM

## 2011-08-20 DIAGNOSIS — R42 Dizziness and giddiness: Secondary | ICD-10-CM

## 2011-08-25 ENCOUNTER — Encounter (INDEPENDENT_AMBULATORY_CARE_PROVIDER_SITE_OTHER): Payer: Medicare Other | Admitting: *Deleted

## 2011-08-25 DIAGNOSIS — Z8669 Personal history of other diseases of the nervous system and sense organs: Secondary | ICD-10-CM

## 2011-08-25 DIAGNOSIS — R42 Dizziness and giddiness: Secondary | ICD-10-CM

## 2011-08-25 DIAGNOSIS — I359 Nonrheumatic aortic valve disorder, unspecified: Secondary | ICD-10-CM

## 2011-09-02 ENCOUNTER — Telehealth: Payer: Self-pay | Admitting: Pulmonary Disease

## 2011-09-02 NOTE — Telephone Encounter (Signed)
Called and spoke with pt and wife.  Wife is wanting pt's echo results.  Informed them of echo results and Sn's recs.  Wife and pt verbalized understanding and denied any questions.

## 2011-10-13 ENCOUNTER — Encounter: Payer: Self-pay | Admitting: Pulmonary Disease

## 2011-10-13 ENCOUNTER — Ambulatory Visit (INDEPENDENT_AMBULATORY_CARE_PROVIDER_SITE_OTHER): Payer: Medicare Other | Admitting: Pulmonary Disease

## 2011-10-13 VITALS — BP 128/82 | HR 52 | Temp 97.2°F | Ht 67.0 in | Wt 149.4 lb

## 2011-10-13 DIAGNOSIS — G609 Hereditary and idiopathic neuropathy, unspecified: Secondary | ICD-10-CM

## 2011-10-13 DIAGNOSIS — D126 Benign neoplasm of colon, unspecified: Secondary | ICD-10-CM

## 2011-10-13 DIAGNOSIS — M545 Low back pain: Secondary | ICD-10-CM

## 2011-10-13 DIAGNOSIS — I359 Nonrheumatic aortic valve disorder, unspecified: Secondary | ICD-10-CM

## 2011-10-13 DIAGNOSIS — I739 Peripheral vascular disease, unspecified: Secondary | ICD-10-CM

## 2011-10-13 DIAGNOSIS — N259 Disorder resulting from impaired renal tubular function, unspecified: Secondary | ICD-10-CM

## 2011-10-13 DIAGNOSIS — K5730 Diverticulosis of large intestine without perforation or abscess without bleeding: Secondary | ICD-10-CM

## 2011-10-13 DIAGNOSIS — C61 Malignant neoplasm of prostate: Secondary | ICD-10-CM

## 2011-10-13 DIAGNOSIS — E119 Type 2 diabetes mellitus without complications: Secondary | ICD-10-CM

## 2011-10-13 DIAGNOSIS — I872 Venous insufficiency (chronic) (peripheral): Secondary | ICD-10-CM

## 2011-10-13 DIAGNOSIS — I10 Essential (primary) hypertension: Secondary | ICD-10-CM

## 2011-10-13 DIAGNOSIS — D649 Anemia, unspecified: Secondary | ICD-10-CM

## 2011-10-13 MED ORDER — TAMSULOSIN HCL 0.4 MG PO CAPS: 0.4000 mg | ORAL_CAPSULE | Freq: Every day | ORAL | Status: DC

## 2011-10-13 NOTE — Patient Instructions (Signed)
Today we updated your med list in our EPIC system...    Continue your current medications the same...  We refilled your Tamsulosin today...  Be sure to take your meds regularly...  Call for any problems...  Let's plan a follow up visit w/ blood work in 3-4 months.Marland KitchenMarland Kitchen

## 2011-10-13 NOTE — Progress Notes (Addendum)
Subjective:    Patient ID: Shane [name], male    DOB: 16-Jul-1916, 78 y.o.   MRN: [id number]  HPI 78 y/o BM here for a follow up visit... he has mult med problems including HBP; AS; ASPVD; DM; Divertics & Colon polyps; Renal insuffic; Prostate cancer; DJD; Anemia...  ~  September 16, 2010:  59mo ROV, now 78 y/o & he's been married 88yrs> states he's feeling OK w/o new complaints or concerns...    CV:  BP controlled on his 3 meds= 136/68 & he denies CP, palpit, SOB, swelling;  takes 81mg  ASA daily & denies ischemic symptoms & feet are doing satis.    DM:  weight is back up 9# to 138# today;  on MetformBid & BS=96, A1c=7.3 today.    Renal:  Creat range 1.5-2.0 over the last several yrs;  labs today w/ BUN=22, Creat=1.8;  hx prostate cancer on Flomax from Shane [name] (PSA=1.9 Sep11).    Anemia:  Hg range 9.8-11.3 over the last several yrs;  today Hg=10.2, MCV=85, Fe=39 (13%), Stool negx6, B12=236, Folate=norm, SPE=neg...  REC> FeSO4 + VitC + oral B12 trial.  ~  April 13, 2011:  74mo ROV & Shane [name] states he's about the same but looks somewhat weaker, more feeble; his CC is his feet- he has mod Tinea Pedis & we reviewed treatment protocol w/ mild soapy water, gauze wipes, then Clotrimazole 1% cream & cotton betw toes >> SEE PROB LIST BELOW:    HBP>  On Nifedipine30 & Losartan25 w/ BP today= 180/90; we called Pharm & his great niece works there> she confirms fairly regular filling rx & pick ups but pt didn't bring bottles or list to office today; we decided to check labs> (see below); and adjust meds> incr Losartan to 50mg /d & add Atenolol 50mg /d.    AS>  On ASA81 + Imdur30 as well; denies CP, palpit, dizzy, syncope, ch in edema; states he is still picking up cans to recycle.    DM>  On Metform500Bid; Labs today showed BS=108, A1c=6.8; continue same, incr fluid intake...    RI>  AbdSonar 9/11 showed medical renal dis, no hydroneph etc; labs today showed BUN=29, Creat=1.7; rec incr fluid intake...    Prostate Ca>   Followed by Shane [name] & his note of 6/12 reviewed> XRT treatment in 1995, PSAs ~2 w/o much change, fairly good flow, nocturia x1, on Flomax.    Anemia>  On FeSO4, VitC, B12 1017mcg/d since 3/12; f/u labs revealed Hg=10.3, MCV=86, B12=1500; rec continue Fe/ VitC/ ok to decr B12 to 1/2...  ~  July 15, 2011:  540mo ROV & his tinea pedis is much improved/ resolved w/ Clotrimazole 1% cream & treatment protocol;  He is c/o poor energy but wt is up 11# to 151# today;  BP much better controlled on & denies CP, palpit, dizzy, syncope, SOB, or incr edema;  DM stable on diet + Metform;  GI & GU appear stable as well...  We reviewed prev labs & decided to f/u in 3 mo w/ CXR & fasting blood work...  ~  August 11, 2011:  40mo ROV & post ER check> Shane [name] walked into the office today requesting a post ER check; Records show that he went to the ER 08/09/11 w/ dizziness & transient visual obscuration described as "a skim over my eye"; he felt weak, sleepy, dizzy, speech ok, no HA, no focal neuro symptoms & exam was essent neg;  He was rec to continue his ASA 325mg /d & f/u w/ [place].Marland KitchenMarland Kitchen  LABS: Hg=9.8, WBC=5.0, BUN/Creat=26/1.95, BS=129...  EKG: SBrady, rate52, NSSTTWA, NAD...  MRI Brain: no acute abnormality, prominent ventricles, mild sinus inflamm changes... Recall hx of HBP w/ poor med compliance complicating his care, Hx AS w/ last 2DEcho 2009, known ASPVD (see below); Since the ER he has been stable, but has mult somatic complaints;  After careful review & discussion w/ pt & wife- we decided to pursue further eval w/ repeat 2DEcho and CDopplers; we will treat w/ Meclizine 25mg - 1/2 to 1 tab po Q6H as needed...  ~  October 13, 2011:  66mo ROV & Shane [name] states "doing ok- just old age"... States energy is good but limited by arthritis; denies CP, palpit, SOB, & no further dizziness... Shane [name] & his wife have been married 73 yrs!  We reviewed his prob list, meds, xrays and labs>  See below>>          Problem List:  DENTAL  CARIES (ICD-521.00) - this needs attention from his dentist & he promises to see him soon for f/u...  HYPERTENSION (ICD-401.9) - on ATENOLOL 50mg /d, PROCARDIA XL 30mg /d & COZAAR 25mg /d... ~  cath 1996 w/ norm coronaries, & Cardiolite 2001 was neg... ~  CXR 4/10 showed norm heart, clear lungs... ~  CXR 9/11 showed >> he forgot to get his CXR today! ~  10/12: BP elevated at OV today 180/90 & we decided to increase Losartan50 & add Atenolol50... ~  1/13:  BP= 116/78 on Aten50, Proc30, Losar25... There is a small postural drop on standing but no dizziness reported. ~  4/13:  BP= 128/82 & he reamins asymptomatic...  AORTIC STENOSIS (ICD-424.1) - on ASA 325mg  (incr from 81mg  via ER 1/13)+ IMDUR 30mg /d... denies CP, palpit, dizzy or syncope (he was hosp in 2001 for syncope)... he walks regularly and picks up cans for recycle "It's bread and gas money"... he knows to stay well hydrated. ~  2DEcho 4/05 showed mod Ao sclerosis & mild Ao root dil, mod asymmetric LVH...  ~  2DEcho 5/09 showed mod incr LVwall thickness w/ focal basal septal hypertropy & end-sys mid cavity oblit, mod calcif AoV c/w mild AS, mild asc Ao dil... ~  2DEcho 1/13 showed incr wall thickness & severe LVH, norm sys function w/ EF=55-60%, modAS w/ 46mm peak gradient, mildMR, mild LAdil...  PERIPHERAL VASCULAR DISEASE (ICD-443.9) - on ASA 325mg /d now... ArtDopplers of LE's in 2001 w/ calcif vessels and decr toe pressures but norm ABI's... he knows to avoid trauma, inspect feet regularly & watch for pressure sores etc> he has Tinea Pedis & we reviewed treatment protocol. ~  3/11:  sm lesion dorsum left foot> sent to wound care clinic & slowly resolved... ~  9/12:  Mod tinea pedis left>right & we decided to treat w/ Clotrimazole cream... ~  1/13:  CDopplers showed no signif plaque & 0-39% bilat ICA stenoses...  VENOUS INSUFFICIENCY (ICD-459.81) - tr edema & he knows to elim salt, elevate legs, wear support hose...  DIABETES MELLITUS  (ICD-250.00) - on METFORMIN 500mg Bid... ~  labs 10/08 showed BS=107, HgA1c=6.8.[name]. note BUN=25, Creat=1.7.Marland KitchenMarland Kitchen no DM retinopathy per ophthal. ~  labs 4/09 showed BS= 142, HgA1c= 7.0.[name]. he didn't want to add more meds. ~  labs 10/09 showed BS= 118, HgA1c= 6.4 ~  labs 4/10 showed BS= 100, HgA1c= 6.7 ~  labs 10/10 showed BS= 121, A1c= 6.3 ~  labs 4/11 showed BS= 95, A1c= 6.8 ~  labs 9/11 showed BS= 96, A1c= 7.0 ~  Labs 3/12 on Metform500Bid (wt=137#) showed  BS=96, A1c=7.3 ~  Labs 10/12 on Metform500Bid (wt=140#) showed BS=108, A1c=6.8 ~  Labs 1/13 showed BS= 129  DIVERTICULOSIS OF COLON (ICD-562.10) - he notes occas constip & we discussed Miralax/ Senakot-S.  COLONIC POLYPS (ICD-211.3) - last colonoscopy 9/99 by DrPatterson was neg without recurrent colon polyps, mild divertics only... ~  9/11: notes some constip improved w/ Senakot- denies blood etc... check stool cards> 6/6 neg.  RENAL INSUFFICIENCY (ICD-588.9) - he has mild renal insuffic w/ BUN= 25, Creat= 1.7 on labs 10/08. ~  labs 4/09 showed BUN= 23, Creat= 1.8 ~  labs 10/09 showed BUN= 30, Creat= 2.0 ~  labs 4/10 showed BUN= 27, Creat= 1.6 ~  labs 10/10 showed BUN= 29, Creat= 1.5 ~  labs 4/11 showed BUN= 20, Creat= 1.7 ~  labs 9/11 showed BUN= 26, Creat= 1.5, AbdSonar w/ incr echogenicity c/w med renal dis, no hydroneph. ~  Labs 3/12 showed BUN= 22, Creat= 1.8 ~  Labs 10/12 showed BUN= 29, Creat= 1.7 ~  Labs 1/13 from ER showed BUN= 26, Creat= 1.95  ADENOCARCINOMA, PROSTATE (ICD-185) - prev on Flomax from Shane [name]...  ~  10/10:  pt reports being seen by Shane [name] 3-4 mo ago & doing well by his report. ~  9/11:  labs showed PSA= 1.90 ~  6/12:  Last note from Shane [name]; stable, XRT treatment in 1995, PSAs ~2 w/o much change, fairly good flow, nocturia x1, on Flomax. ~  1/13:  Pt ran out/ stopped Flomax on his own & reports good stream, denies voiding difficulty...  Hx of LOW BACK PAIN, CHRONIC (ICD-724.2) - s/p eval from DrDuda  for ortho...  SYNCOPE, HX OF (ICD-V12.49)  PERIPHERAL NEUROPATHY (ICD-356.9) - off prev Neurontin Rx...  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 ~  labs 2008 showed Hg= 11.9 ~  labs 4/09 showed Hg= 11.1 ~  labs 10/09 showed Hg= 10.7 ~  labs 4/10 showed Hg= 11.3 ~  labs 10/10 showed Hg= 10.8, MCV= 89 ~  labs 4/11 showed Hg= 10.4, MCV= 83 ~  labs 9/11 showed Hg= 9.8, MCV= 87, stool cards neg x6... ~  Labs 3/12 showed Hg= 10.2, MCV= 85, Fe= 39 (13%sat), B12=236, Folate>25, SPE=neg ~  Labs 10/12 showed Hg= 10.3, MCV= 86, B12= 1500; continue Fe, ok to cut B12 to 1/2... ~  Labs 1/13 in ER showed Hg= 9.8   No past surgical history on file.   Outpatient Encounter Prescriptions as of 10/13/2011  Medication Sig Dispense Refill  . aspirin 325 MG tablet Take 325 mg by mouth daily.        [name] atenolol (TENORMIN) 50 MG tablet Take 1 tablet (50 mg total) by mouth daily.  30 tablet  11  . clotrimazole (LOTRIMIN) 1 % cream Apply topically 2 (two) times daily. To toes  30 g  5  . Ferrous Sulfate Dried (FEOSOL) 200 (65 FE) MG TABS Take 1 tablet by mouth daily. Take with vitamin c 500mg  daily       . isosorbide mononitrate (IMDUR) 30 MG 24 hr tablet Take one (1) tablet(s) once daily  30 tablet  6  . losartan (COZAAR) 25 MG tablet Take 25 mg by mouth daily.      . magnesium citrate (BL MAGNESIUM CITRATE) 1.745 GM/30ML SOLN As needed for constipation       . meclizine (ANTIVERT) 25 MG tablet Take 1 tablet (25 mg total) by mouth every 6 (six) hours as needed  for dizziness or nausea.  50 tablet  2  . metFORMIN (GLUCOPHAGE) 500 MG tablet Take 1 tablet (500 mg total) by mouth 2 (two) times daily with a meal.  60 tablet  11  . NIFEdipine (PROCARDIA XL/ADALAT-CC) 30 MG 24 hr tablet Take 30 mg by mouth daily.        . nitroGLYCERIN (NITROSTAT) 0.4 MG SL tablet Place 0.4 mg under the tongue every 5 (five) minutes as needed.        .  senna-docusate (SENOKOT-S) 8.6-50 MG per tablet Take 1 tablet by mouth daily.        . Tamsulosin HCl (FLOMAX) 0.4 MG CAPS Take 0.4 mg by mouth at bedtime.      . vitamin B-12 (CYANOCOBALAMIN) 500 MCG tablet Take 500 mcg by mouth daily.        . vitamin C (ASCORBIC ACID) 500 MG tablet Take 500 mg by mouth daily.        No Known Allergies   Current Medications, Allergies, Past Medical History, Past Surgical History, Family History, and Social History were reviewed in [employer] record.    Review of Systems        See HPI - all other systems neg except as noted...  The patient complains of dyspnea on exertion.  The patient denies anorexia, fever, weight loss, weight gain, vision loss, decreased hearing, hoarseness, chest pain, syncope, peripheral edema, prolonged cough, headaches, hemoptysis, abdominal pain, melena, hematochezia, severe indigestion/heartburn, hematuria, incontinence, muscle weakness, suspicious skin lesions, transient blindness, difficulty walking, depression, unusual weight change, abnormal bleeding, enlarged lymph nodes, and angioedema.     Objective:   Physical Exam    WD, WN, 78 y/o BM in NAD; weak, chr ill appearing... GENERAL:  Alert & oriented; pleasant & cooperative... HEENT:  [place]/AT, EOM-wnl, PERRLA, EACs-clear, TMs-wnl, NOSE-clear, THROAT-clear & wnl; severe dental caries... NECK:  Supple w/ decrROM; no JVD; normal carotid impulses +bruit vs transmit murmur on right; no thyromegaly or nodules palpated; no lymphadenopathy. CHEST:  Clear to P & A; without wheezes/ rales/ or rhonchi. HEART:  Regular Rhythm;  gr 2-3/6 SEM without rubs or gallops heard... ABDOMEN:  Soft & nontender; normal bowel sounds; no organomegaly or masses detected. EXT: without deformities, mod arthritic changes (esp neck) no varicose veins/ +venous insuffic/ no edema. NEURO:  CN's intact;  no focal neuro deficits... touch sensation intact in feet. DERM:  Tinea pedis +  dry skin LE's...  RADIOLOGY DATA:  Reviewed in the EPIC EMR & discussed w/ the patient...    >>Last CXR 4/10 showed norm heart size, clear lungs, NAD...  LABORATORY DATA:  Reviewed in the EPIC EMR & discussed w/ the patient...   Assessment & Plan:   HBP>  On Atenolol50, Nifedipine30 & Losartan25 w/ BP today= 128/82 standing (min postural change noted)...  AS>  On ASA81 + Imdur30 as well; denies CP, palpit, syncope, ch in edema; 2DEcho shows mod AS & severe LVH...  ASPVD>  On ASA, now 325mg /d, he denies claudication symptoms; prev tinea resolved; we will proceed w/ CDopplers...  Ven Insuffic>  Prev mod tinea pedis left>right resolved w/ Clotrimazole cream...  DM>  On Metform500Bid; Labs 10/12 showed BS=108, A1c=6.8; continue same, incr fluid intake...  GI> Divertics, Polyps>  On Miralax, Senakot; regular BMs if he takes these regularly...  Renal Insuffic>  Creat up to 1.95 & he is encouraged to incr fluid intake, advised on nutrition etc...  Prostate Cancer>  Followed by Shane [name] & his note of  6/12 reviewed> XRT treatment in 1995, PSAs ~2 w/o much change, fairly good flow, nocturia x1, off Flomax now.  LBP>  He manages fairly well & gets plenty of exercise...  Anemia>  On FeSO4, VitC, B12 1091mcg/d since 3/12; f/u labs revealed Hg=10.3, MCV=86, B12=1500; rec continue Fe/ VitC/ ok to decr B12 to 1/2...   Patient's Medications  New Prescriptions   No medications on file  Previous Medications   ASPIRIN 325 MG TABLET    Take 325 mg by mouth daily.     ATENOLOL (TENORMIN) 50 MG TABLET    Take 1 tablet (50 mg total) by mouth daily.   CLOTRIMAZOLE (LOTRIMIN) 1 % CREAM    Apply topically 2 (two) times daily. To toes   FERROUS SULFATE DRIED (FEOSOL) 200 (65 FE) MG TABS    Take 1 tablet by mouth daily. Take with vitamin c 500mg  daily    ISOSORBIDE MONONITRATE (IMDUR) 30 MG 24 HR TABLET    Take one (1) tablet(s) once daily   LOSARTAN (COZAAR) 25 MG TABLET    Take 25 mg by mouth daily.     MAGNESIUM CITRATE (BL MAGNESIUM CITRATE) 1.745 GM/30ML SOLN    As needed for constipation    MECLIZINE (ANTIVERT) 25 MG TABLET    Take 1 tablet (25 mg total) by mouth every 6 (six) hours as needed for dizziness or nausea.   METFORMIN (GLUCOPHAGE) 500 MG TABLET    Take 1 tablet (500 mg total) by mouth 2 (two) times daily with a meal.   NIFEDIPINE (PROCARDIA XL/ADALAT-CC) 30 MG 24 HR TABLET    Take 30 mg by mouth daily.     NITROGLYCERIN (NITROSTAT) 0.4 MG SL TABLET    Place 0.4 mg under the tongue every 5 (five) minutes as needed.     SENNA-DOCUSATE (SENOKOT-S) 8.6-50 MG PER TABLET    Take 1 tablet by mouth daily.     TAMSULOSIN HCL (FLOMAX) 0.4 MG CAPS    Take 0.4 mg by mouth at bedtime.   VITAMIN B-12 (CYANOCOBALAMIN) 500 MCG TABLET    Take 500 mcg by mouth daily.     VITAMIN C (ASCORBIC ACID) 500 MG TABLET    Take 500 mg by mouth daily.  Modified Medications   No medications on file  Discontinued Medications   No medications on file

## 2011-10-19 ENCOUNTER — Other Ambulatory Visit: Payer: Self-pay | Admitting: *Deleted

## 2011-10-19 MED ORDER — ISOSORBIDE MONONITRATE ER 30 MG PO TB24: 30.0000 mg | ORAL_TABLET | Freq: Every day | ORAL | Status: DC

## 2011-10-19 MED ORDER — NIFEDIPINE ER OSMOTIC RELEASE 30 MG PO TB24: 30.0000 mg | ORAL_TABLET | Freq: Every day | ORAL | Status: DC

## 2011-11-18 ENCOUNTER — Other Ambulatory Visit: Payer: Self-pay | Admitting: *Deleted

## 2011-11-18 MED ORDER — NITROGLYCERIN 0.4 MG SL SUBL: 0.4000 mg | SUBLINGUAL_TABLET | SUBLINGUAL | Status: DC | PRN

## 2011-12-19 ENCOUNTER — Inpatient Hospital Stay (HOSPITAL_COMMUNITY): Payer: Medicare Other

## 2011-12-19 ENCOUNTER — Encounter (HOSPITAL_COMMUNITY): Payer: Self-pay | Admitting: Neurology

## 2011-12-19 ENCOUNTER — Inpatient Hospital Stay (HOSPITAL_COMMUNITY)
Admission: EM | Admit: 2011-12-19 | Discharge: 2011-12-28 | DRG: 308 | Disposition: A | Discharge status: Skilled Nursing Facility | Payer: Medicare Other | Attending: Internal Medicine | Admitting: Internal Medicine

## 2011-12-19 ENCOUNTER — Emergency Department (HOSPITAL_COMMUNITY): Payer: Medicare Other

## 2011-12-19 DIAGNOSIS — R634 Abnormal weight loss: Secondary | ICD-10-CM

## 2011-12-19 DIAGNOSIS — N183 Chronic kidney disease, stage 3 unspecified: Secondary | ICD-10-CM | POA: Diagnosis present

## 2011-12-19 DIAGNOSIS — E118 Type 2 diabetes mellitus with unspecified complications: Secondary | ICD-10-CM | POA: Diagnosis present

## 2011-12-19 DIAGNOSIS — R42 Dizziness and giddiness: Secondary | ICD-10-CM

## 2011-12-19 DIAGNOSIS — N179 Acute kidney failure, unspecified: Secondary | ICD-10-CM | POA: Diagnosis not present

## 2011-12-19 DIAGNOSIS — I359 Nonrheumatic aortic valve disorder, unspecified: Secondary | ICD-10-CM | POA: Diagnosis present

## 2011-12-19 DIAGNOSIS — G609 Hereditary and idiopathic neuropathy, unspecified: Secondary | ICD-10-CM

## 2011-12-19 DIAGNOSIS — N259 Disorder resulting from impaired renal tubular function, unspecified: Secondary | ICD-10-CM | POA: Diagnosis present

## 2011-12-19 DIAGNOSIS — I739 Peripheral vascular disease, unspecified: Secondary | ICD-10-CM | POA: Diagnosis present

## 2011-12-19 DIAGNOSIS — I160 Hypertensive urgency: Secondary | ICD-10-CM

## 2011-12-19 DIAGNOSIS — L723 Sebaceous cyst: Secondary | ICD-10-CM

## 2011-12-19 DIAGNOSIS — L97509 Non-pressure chronic ulcer of other part of unspecified foot with unspecified severity: Secondary | ICD-10-CM

## 2011-12-19 DIAGNOSIS — D510 Vitamin B12 deficiency anemia due to intrinsic factor deficiency: Secondary | ICD-10-CM | POA: Diagnosis present

## 2011-12-19 DIAGNOSIS — E1165 Type 2 diabetes mellitus with hyperglycemia: Secondary | ICD-10-CM

## 2011-12-19 DIAGNOSIS — D126 Benign neoplasm of colon, unspecified: Secondary | ICD-10-CM

## 2011-12-19 DIAGNOSIS — R4182 Altered mental status, unspecified: Secondary | ICD-10-CM | POA: Diagnosis not present

## 2011-12-19 DIAGNOSIS — I498 Other specified cardiac arrhythmias: Principal | ICD-10-CM | POA: Diagnosis present

## 2011-12-19 DIAGNOSIS — Z79899 Other long term (current) drug therapy: Secondary | ICD-10-CM

## 2011-12-19 DIAGNOSIS — I129 Hypertensive chronic kidney disease with stage 1 through stage 4 chronic kidney disease, or unspecified chronic kidney disease: Secondary | ICD-10-CM | POA: Diagnosis present

## 2011-12-19 DIAGNOSIS — M545 Low back pain: Secondary | ICD-10-CM

## 2011-12-19 DIAGNOSIS — I10 Essential (primary) hypertension: Secondary | ICD-10-CM

## 2011-12-19 DIAGNOSIS — K029 Dental caries, unspecified: Secondary | ICD-10-CM

## 2011-12-19 DIAGNOSIS — I951 Orthostatic hypotension: Secondary | ICD-10-CM

## 2011-12-19 DIAGNOSIS — R55 Syncope and collapse: Secondary | ICD-10-CM | POA: Diagnosis present

## 2011-12-19 DIAGNOSIS — N184 Chronic kidney disease, stage 4 (severe): Secondary | ICD-10-CM | POA: Diagnosis present

## 2011-12-19 DIAGNOSIS — Z8669 Personal history of other diseases of the nervous system and sense organs: Secondary | ICD-10-CM

## 2011-12-19 DIAGNOSIS — E875 Hyperkalemia: Secondary | ICD-10-CM

## 2011-12-19 DIAGNOSIS — B9689 Other specified bacterial agents as the cause of diseases classified elsewhere: Secondary | ICD-10-CM | POA: Diagnosis not present

## 2011-12-19 DIAGNOSIS — E860 Dehydration: Secondary | ICD-10-CM | POA: Diagnosis not present

## 2011-12-19 DIAGNOSIS — L03119 Cellulitis of unspecified part of limb: Secondary | ICD-10-CM

## 2011-12-19 DIAGNOSIS — Z7982 Long term (current) use of aspirin: Secondary | ICD-10-CM

## 2011-12-19 DIAGNOSIS — Z87891 Personal history of nicotine dependence: Secondary | ICD-10-CM

## 2011-12-19 DIAGNOSIS — G929 Unspecified toxic encephalopathy: Secondary | ICD-10-CM | POA: Diagnosis not present

## 2011-12-19 DIAGNOSIS — R001 Bradycardia, unspecified: Secondary | ICD-10-CM

## 2011-12-19 DIAGNOSIS — K5730 Diverticulosis of large intestine without perforation or abscess without bleeding: Secondary | ICD-10-CM

## 2011-12-19 DIAGNOSIS — L02619 Cutaneous abscess of unspecified foot: Secondary | ICD-10-CM

## 2011-12-19 DIAGNOSIS — B353 Tinea pedis: Secondary | ICD-10-CM

## 2011-12-19 DIAGNOSIS — D649 Anemia, unspecified: Secondary | ICD-10-CM

## 2011-12-19 DIAGNOSIS — N390 Urinary tract infection, site not specified: Secondary | ICD-10-CM

## 2011-12-19 DIAGNOSIS — I872 Venous insufficiency (chronic) (peripheral): Secondary | ICD-10-CM

## 2011-12-19 DIAGNOSIS — E119 Type 2 diabetes mellitus without complications: Secondary | ICD-10-CM | POA: Diagnosis present

## 2011-12-19 DIAGNOSIS — D509 Iron deficiency anemia, unspecified: Secondary | ICD-10-CM | POA: Diagnosis present

## 2011-12-19 DIAGNOSIS — R1310 Dysphagia, unspecified: Secondary | ICD-10-CM | POA: Diagnosis present

## 2011-12-19 DIAGNOSIS — M79609 Pain in unspecified limb: Secondary | ICD-10-CM

## 2011-12-19 DIAGNOSIS — C61 Malignant neoplasm of prostate: Secondary | ICD-10-CM | POA: Diagnosis present

## 2011-12-19 DIAGNOSIS — G92 Toxic encephalopathy: Secondary | ICD-10-CM | POA: Diagnosis not present

## 2011-12-19 HISTORY — DX: Hyperkalemia: E87.5

## 2011-12-19 LAB — COMPREHENSIVE METABOLIC PANEL WITH GFR
ALT: 9 U/L (ref 0–53)
AST: 17 U/L (ref 0–37)
Albumin: 3.4 g/dL — ABNORMAL LOW (ref 3.5–5.2)
Alkaline Phosphatase: 66 U/L (ref 39–117)
BUN: 30 mg/dL — ABNORMAL HIGH (ref 6–23)
CO2: 25 meq/L (ref 19–32)
Calcium: 9.2 mg/dL (ref 8.4–10.5)
Chloride: 106 meq/L (ref 96–112)
Creatinine, Ser: 1.93 mg/dL — ABNORMAL HIGH (ref 0.50–1.35)
GFR calc Af Amer: 33 mL/min — ABNORMAL LOW
GFR calc non Af Amer: 28 mL/min — ABNORMAL LOW
Glucose, Bld: 121 mg/dL — ABNORMAL HIGH (ref 70–99)
Potassium: 5.2 meq/L — ABNORMAL HIGH (ref 3.5–5.1)
Sodium: 141 meq/L (ref 135–145)
Total Bilirubin: 0.2 mg/dL — ABNORMAL LOW (ref 0.3–1.2)
Total Protein: 6.7 g/dL (ref 6.0–8.3)

## 2011-12-19 LAB — BASIC METABOLIC PANEL
Calcium: 9.1 mg/dL (ref 8.4–10.5)
Creatinine, Ser: 1.86 mg/dL — ABNORMAL HIGH (ref 0.50–1.35)
GFR calc Af Amer: 34 mL/min — ABNORMAL LOW (ref >90)
GFR calc non Af Amer: 30 mL/min — ABNORMAL LOW (ref >90)
Sodium: 143 mEq/L (ref 135–145)

## 2011-12-19 LAB — URINALYSIS, ROUTINE W REFLEX MICROSCOPIC
Bilirubin Urine: NEGATIVE
Glucose, UA: NEGATIVE mg/dL
Ketones, ur: NEGATIVE mg/dL
Leukocytes, UA: NEGATIVE
Protein, ur: 30 mg/dL — AB
pH: 6 (ref 5.0–8.0)

## 2011-12-19 LAB — URINE MICROSCOPIC-ADD ON

## 2011-12-19 LAB — CREATININE, SERUM
GFR calc Af Amer: 35 mL/min — ABNORMAL LOW (ref >90)
GFR calc non Af Amer: 30 mL/min — ABNORMAL LOW (ref >90)

## 2011-12-19 LAB — PROTIME-INR
INR: 0.99 (ref 0.00–1.49)
Prothrombin Time: 13.3 s (ref 11.6–15.2)

## 2011-12-19 LAB — HEMOGLOBIN A1C
Hgb A1c MFr Bld: 6.9 % — ABNORMAL HIGH
Mean Plasma Glucose: 151 mg/dL — ABNORMAL HIGH

## 2011-12-19 LAB — CBC
HCT: 31.9 % — ABNORMAL LOW (ref 39.0–52.0)
HCT: 33.3 % — ABNORMAL LOW (ref 39.0–52.0)
MCV: 83.9 fL (ref 78.0–100.0)
MCV: 84.9 fL (ref 78.0–100.0)
Platelets: 206 10*3/uL (ref 150–400)
Platelets: 201 10*3/uL (ref 150–400)
RBC: 3.92 MIL/uL — ABNORMAL LOW (ref 4.22–5.81)
RDW: 15.8 % — ABNORMAL HIGH (ref 11.5–15.5)
WBC: 6.5 10*3/uL (ref 4.0–10.5)

## 2011-12-19 LAB — DIFFERENTIAL
Lymphs Abs: 1.2 10*3/uL (ref 0.7–4.0)
Monocytes Relative: 7 % (ref 3–12)
Neutro Abs: 3.8 10*3/uL (ref 1.7–7.7)
Neutrophils Relative %: 68 % (ref 43–77)

## 2011-12-19 LAB — CARDIAC PANEL(CRET KIN+CKTOT+MB+TROPI)
Relative Index: 1.1 (ref 0.0–2.5)
Total CK: 265 U/L — ABNORMAL HIGH (ref 7–232)

## 2011-12-19 LAB — APTT: aPTT: 35 s (ref 24–37)

## 2011-12-19 LAB — TSH: TSH: 1.223 u[IU]/mL (ref 0.350–4.500)

## 2011-12-19 LAB — POCT I-STAT TROPONIN I: Troponin i, poc: 0 ng/mL (ref 0.00–0.08)

## 2011-12-19 MED ORDER — FERROUS SULFATE 325 (65 FE) MG PO TABS
325.0000 mg | ORAL_TABLET | Freq: Every day | ORAL | Status: DC
  Administered 2011-12-20 – 2011-12-28 (×9): 325 mg via ORAL
  Filled 2011-12-19 (×10): qty 1

## 2011-12-19 MED ORDER — LOSARTAN POTASSIUM 25 MG PO TABS
25.0000 mg | ORAL_TABLET | Freq: Every day | ORAL | Status: DC
Start: 2011-12-19 — End: 2011-12-19
  Filled 2011-12-19: qty 1

## 2011-12-19 MED ORDER — SODIUM CHLORIDE 0.9 % IV SOLN
INTRAVENOUS | Status: AC
  Administered 2011-12-19: 18:00:00 via INTRAVENOUS

## 2011-12-19 MED ORDER — LOSARTAN POTASSIUM 25 MG PO TABS
25.0000 mg | ORAL_TABLET | Freq: Every day | ORAL | Status: DC
  Administered 2011-12-20: 25 mg via ORAL
  Filled 2011-12-19: qty 1

## 2011-12-19 MED ORDER — SODIUM CHLORIDE 0.9 % IV BOLUS (SEPSIS)
500.0000 mL | Freq: Once | INTRAVENOUS | Status: AC
  Administered 2011-12-19: 500 mL via INTRAVENOUS

## 2011-12-19 MED ORDER — NIFEDIPINE ER 30 MG PO TB24
30.0000 mg | ORAL_TABLET | Freq: Every day | ORAL | Status: DC
  Administered 2011-12-20: 30 mg via ORAL
  Filled 2011-12-19: qty 1

## 2011-12-19 MED ORDER — TAMSULOSIN HCL 0.4 MG PO CAPS
0.4000 mg | ORAL_CAPSULE | Freq: Every day | ORAL | Status: DC
  Administered 2011-12-20 – 2011-12-27 (×8): 0.4 mg via ORAL
  Filled 2011-12-19 (×11): qty 1

## 2011-12-19 MED ORDER — NITROGLYCERIN 0.4 MG SL SUBL: 0.4000 mg | SUBLINGUAL_TABLET | SUBLINGUAL | Status: DC | PRN

## 2011-12-19 MED ORDER — HYDRALAZINE HCL 20 MG/ML IJ SOLN
10.0000 mg | Freq: Once | INTRAMUSCULAR | Status: AC
  Administered 2011-12-19: 10 mg via INTRAVENOUS

## 2011-12-19 MED ORDER — MECLIZINE HCL 25 MG PO TABS
25.0000 mg | ORAL_TABLET | Freq: Four times a day (QID) | ORAL | Status: DC | PRN
  Filled 2011-12-19: qty 1

## 2011-12-19 MED ORDER — ACETAMINOPHEN 325 MG PO TABS
650.0000 mg | ORAL_TABLET | Freq: Four times a day (QID) | ORAL | Status: DC | PRN
  Administered 2011-12-22 – 2011-12-27 (×3): 650 mg via ORAL
  Filled 2011-12-19: qty 2
  Filled 2011-12-19: qty 1
  Filled 2011-12-19: qty 2

## 2011-12-19 MED ORDER — FERROUS SULFATE DRIED 200 (65 FE) MG PO TABS: 1.0000 | ORAL_TABLET | Freq: Every day | ORAL | Status: DC

## 2011-12-19 MED ORDER — ACETAMINOPHEN 650 MG RE SUPP: 650.0000 mg | Freq: Four times a day (QID) | RECTAL | Status: DC | PRN

## 2011-12-19 MED ORDER — ONDANSETRON HCL 4 MG/2ML IJ SOLN: 4.0000 mg | Freq: Four times a day (QID) | INTRAMUSCULAR | Status: DC | PRN

## 2011-12-19 MED ORDER — SENNOSIDES-DOCUSATE SODIUM 8.6-50 MG PO TABS
1.0000 | ORAL_TABLET | Freq: Every day | ORAL | Status: DC
  Administered 2011-12-20 – 2011-12-28 (×9): 1 via ORAL
  Filled 2011-12-19 (×9): qty 1

## 2011-12-19 MED ORDER — MAGNESIUM CITRATE PO SOLN
1.0000 | Freq: Every day | ORAL | Status: DC | PRN
  Filled 2011-12-19: qty 296

## 2011-12-19 MED ORDER — ASPIRIN 325 MG PO TABS
325.0000 mg | ORAL_TABLET | Freq: Every day | ORAL | Status: DC
  Administered 2011-12-20 – 2011-12-28 (×9): 325 mg via ORAL
  Filled 2011-12-19 (×10): qty 1

## 2011-12-19 MED ORDER — ENOXAPARIN SODIUM 40 MG/0.4ML ~~LOC~~ SOLN
40.0000 mg | SUBCUTANEOUS | Status: DC
  Filled 2011-12-19 (×2): qty 0.4

## 2011-12-19 MED ORDER — CYANOCOBALAMIN 500 MCG PO TABS
500.0000 ug | ORAL_TABLET | Freq: Every day | ORAL | Status: DC
  Administered 2011-12-20 – 2011-12-28 (×9): 500 ug via ORAL
  Filled 2011-12-19 (×9): qty 1

## 2011-12-19 MED ORDER — HYDRALAZINE HCL 20 MG/ML IJ SOLN
10.0000 mg | Freq: Four times a day (QID) | INTRAMUSCULAR | Status: DC | PRN
  Administered 2011-12-20 – 2011-12-21 (×4): 10 mg via INTRAVENOUS
  Filled 2011-12-19 (×3): qty 1
  Filled 2011-12-19: qty 0.5
  Filled 2011-12-19 (×2): qty 1

## 2011-12-19 MED ORDER — ONDANSETRON HCL 4 MG PO TABS: 4.0000 mg | ORAL_TABLET | Freq: Four times a day (QID) | ORAL | Status: DC | PRN

## 2011-12-19 MED ORDER — ISOSORBIDE MONONITRATE ER 30 MG PO TB24
30.0000 mg | ORAL_TABLET | Freq: Every day | ORAL | Status: DC
  Filled 2011-12-19: qty 1

## 2011-12-19 MED ORDER — OXYCODONE HCL 5 MG PO TABS: 5.0000 mg | ORAL_TABLET | ORAL | Status: DC | PRN

## 2011-12-19 MED ORDER — CLOTRIMAZOLE 1 % EX CREA
TOPICAL_CREAM | Freq: Two times a day (BID) | CUTANEOUS | Status: DC
  Administered 2011-12-19 – 2011-12-22 (×6): via TOPICAL
  Administered 2011-12-23: 1 via TOPICAL
  Administered 2011-12-23 – 2011-12-28 (×10): via TOPICAL
  Filled 2011-12-19: qty 15

## 2011-12-19 MED ORDER — ISOSORBIDE MONONITRATE ER 30 MG PO TB24
30.0000 mg | ORAL_TABLET | Freq: Every day | ORAL | Status: DC
  Administered 2011-12-20 – 2011-12-27 (×8): 30 mg via ORAL
  Filled 2011-12-19 (×9): qty 1

## 2011-12-19 MED ORDER — VITAMIN C 500 MG PO TABS
500.0000 mg | ORAL_TABLET | Freq: Every day | ORAL | Status: DC
  Administered 2011-12-20 – 2011-12-28 (×9): 500 mg via ORAL
  Filled 2011-12-19 (×9): qty 1

## 2011-12-19 MED ORDER — ALUM & MAG HYDROXIDE-SIMETH 200-200-20 MG/5ML PO SUSP: 30.0000 mL | Freq: Four times a day (QID) | ORAL | Status: DC | PRN

## 2011-12-19 MED ORDER — VITAMIN B-12 500 MCG PO TABS: 500.0000 ug | ORAL_TABLET | Freq: Every day | ORAL | Status: DC

## 2011-12-19 MED ORDER — SODIUM CHLORIDE 0.9 % IJ SOLN
3.0000 mL | Freq: Two times a day (BID) | INTRAMUSCULAR | Status: DC
  Administered 2011-12-20 – 2011-12-22 (×4): 3 mL via INTRAVENOUS

## 2011-12-19 MED ORDER — HYDRALAZINE HCL 20 MG/ML IJ SOLN
10.0000 mg | INTRAMUSCULAR | Status: AC
  Administered 2011-12-19: 10 mg via INTRAVENOUS
  Filled 2011-12-19: qty 0.5
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
## 2011-12-19 NOTE — ED Notes (Signed)
Asked EDP about potassium level 5.2, reporting reorder i-stat.

## 2011-12-19 NOTE — Progress Notes (Signed)
12/19/11 1634  Discharge Planning  Type of Residence Private residence  Living Arrangements Spouse/significant other  Home Care Services No  Support Systems Spouse/significant other  Do you have any problems obtaining your medications? No  Family/patient expects to be discharged to: Private residence  Once you are discharged, how will you get to your follow-up appointment? Family  Expected Discharge Date 12/21/11  Case Management Consult Needed No  Social Work Consult Needed No    Unit based LCSW to be consulted if disposition needs arise and/or psychosocial issues are identified.  Dionne Milo MSW [facility]. Weekend/Social Worker [phone]
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
## 2011-12-19 NOTE — ED Notes (Signed)
Pt contact [phone]

## 2011-12-19 NOTE — ED Notes (Addendum)
Pt resting, denying any pain. Pt had episode of incontinence. Pt cleaned up, linens changed. Some of pt speech is mumbled but comprehensive. Pt hypertensive, treatment bp at this time, admitting in to see patient.
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
## 2011-12-19 NOTE — ED Notes (Signed)
Pt bp noted to be increasing 200 systolic. Admitting at bedside. Pt has bed on 3700 reporting order for hydrazine, re-evaluate.
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
## 2011-12-20 ENCOUNTER — Encounter (HOSPITAL_COMMUNITY): Payer: Self-pay | Admitting: Internal Medicine

## 2011-12-20 ENCOUNTER — Inpatient Hospital Stay (HOSPITAL_COMMUNITY): Payer: Medicare Other

## 2011-12-20 DIAGNOSIS — R55 Syncope and collapse: Secondary | ICD-10-CM

## 2011-12-20 DIAGNOSIS — I359 Nonrheumatic aortic valve disorder, unspecified: Secondary | ICD-10-CM

## 2011-12-20 DIAGNOSIS — E1165 Type 2 diabetes mellitus with hyperglycemia: Secondary | ICD-10-CM

## 2011-12-20 DIAGNOSIS — I10 Essential (primary) hypertension: Secondary | ICD-10-CM

## 2011-12-20 DIAGNOSIS — E875 Hyperkalemia: Secondary | ICD-10-CM

## 2011-12-20 LAB — BASIC METABOLIC PANEL
CO2: 25 mEq/L (ref 19–32)
Calcium: 9.1 mg/dL (ref 8.4–10.5)
Glucose, Bld: 138 mg/dL — ABNORMAL HIGH (ref 70–99)
Potassium: 4.2 mEq/L (ref 3.5–5.1)
Sodium: 143 mEq/L (ref 135–145)

## 2011-12-20 LAB — CBC
Hemoglobin: 10.5 g/dL — ABNORMAL LOW (ref 13.0–17.0)
MCH: 26.3 pg (ref 26.0–34.0)
MCV: 83.3 fL (ref 78.0–100.0)
Platelets: 193 10*3/uL (ref 150–400)
RBC: 4 MIL/uL — ABNORMAL LOW (ref 4.22–5.81)
WBC: 6.2 10*3/uL (ref 4.0–10.5)

## 2011-12-20 LAB — CARDIAC PANEL(CRET KIN+CKTOT+MB+TROPI)
Relative Index: 0.5 (ref 0.0–2.5)
Relative Index: 0.4 (ref 0.0–2.5)
Total CK: 833 U/L — ABNORMAL HIGH (ref 7–232)
Total CK: 1093 U/L — ABNORMAL HIGH (ref 7–232)
Troponin I: <0.3 ng/mL (ref <0.30)
Troponin I: <0.3 ng/mL (ref <0.30)
Troponin I: <0.3 ng/mL (ref <0.30)

## 2011-12-20 LAB — GLUCOSE, CAPILLARY
Glucose-Capillary: 130 mg/dL — ABNORMAL HIGH (ref 70–99)
Glucose-Capillary: 143 mg/dL — ABNORMAL HIGH (ref 70–99)

## 2011-12-20 MED ORDER — ENOXAPARIN SODIUM 30 MG/0.3ML ~~LOC~~ SOLN
30.0000 mg | SUBCUTANEOUS | Status: DC
  Administered 2011-12-20 – 2011-12-27 (×8): 30 mg via SUBCUTANEOUS
  Filled 2011-12-20 (×9): qty 0.3

## 2011-12-20 MED ORDER — SODIUM CHLORIDE 0.9 % IV SOLN
INTRAVENOUS | Status: AC
  Administered 2011-12-20: 100 mL via INTRAVENOUS

## 2011-12-20 MED ORDER — LOSARTAN POTASSIUM 25 MG PO TABS
25.0000 mg | ORAL_TABLET | Freq: Once | ORAL | Status: AC
  Administered 2011-12-20: 25 mg via ORAL
  Filled 2011-12-20: qty 1

## 2011-12-20 MED ORDER — SODIUM CHLORIDE 0.9 % IV BOLUS (SEPSIS)
500.0000 mL | Freq: Once | INTRAVENOUS | Status: AC
  Administered 2011-12-20: 500 mL via INTRAVENOUS

## 2011-12-20 MED ORDER — LOSARTAN POTASSIUM 50 MG PO TABS
50.0000 mg | ORAL_TABLET | Freq: Every day | ORAL | Status: DC
  Administered 2011-12-21 – 2011-12-22 (×2): 50 mg via ORAL
  Filled 2011-12-20 (×3): qty 1

## 2011-12-20 MED ORDER — NIFEDIPINE ER 60 MG PO TB24
60.0000 mg | ORAL_TABLET | Freq: Every day | ORAL | Status: DC
  Administered 2011-12-21 – 2011-12-27 (×7): 60 mg via ORAL
  Filled 2011-12-20 (×8): qty 1
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
## 2011-12-20 NOTE — Progress Notes (Signed)
Pt. BP 217/78.  Pt denies CP, SOB, or blurry vision.  PRN hydralazine given.  Pt BP recheck 182/69.  Will continue to monitor patient.

## 2011-12-20 NOTE — Progress Notes (Signed)
  Echocardiogram 2D Echocardiogram has been performed.  Shane [name] 12/20/2011, 4:21 PM
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
## 2011-12-20 NOTE — Progress Notes (Signed)
Pt BP 230/98 manually @ 2122.  Pt with c/o blurry vision.  Pt denies CP, SOB, or Headache.  Night MD notified.  Order received for 10mg  IV hydralazine.  Hydralazine given and BP rechecked 30 minutes later.  BP decreased to 180/68 with pt stating his blurry vision resolved.  Will continue to monitor.  Pt verbalizes understanding to call if blurry vision reoccurs.

## 2011-12-21 ENCOUNTER — Ambulatory Visit: Payer: Medicare Other | Admitting: Adult Health

## 2011-12-21 DIAGNOSIS — I10 Essential (primary) hypertension: Secondary | ICD-10-CM

## 2011-12-21 DIAGNOSIS — I498 Other specified cardiac arrhythmias: Principal | ICD-10-CM

## 2011-12-21 DIAGNOSIS — IMO0001 Reserved for inherently not codable concepts without codable children: Secondary | ICD-10-CM

## 2011-12-21 DIAGNOSIS — E1165 Type 2 diabetes mellitus with hyperglycemia: Secondary | ICD-10-CM

## 2011-12-21 DIAGNOSIS — R55 Syncope and collapse: Secondary | ICD-10-CM

## 2011-12-21 DIAGNOSIS — E875 Hyperkalemia: Secondary | ICD-10-CM

## 2011-12-21 LAB — CARDIAC PANEL(CRET KIN+CKTOT+MB+TROPI)
CK, MB: 2.9 ng/mL (ref 0.3–4.0)
Relative Index: 0.4 (ref 0.0–2.5)
Relative Index: 0.3 (ref 0.0–2.5)
Total CK: 963 U/L — ABNORMAL HIGH (ref 7–232)
Troponin I: <0.3 ng/mL (ref <0.30)
Troponin I: <0.3 ng/mL (ref <0.30)

## 2011-12-21 LAB — BASIC METABOLIC PANEL
BUN: 30 mg/dL — ABNORMAL HIGH (ref 6–23)
Creatinine, Ser: 1.57 mg/dL — ABNORMAL HIGH (ref 0.50–1.35)
GFR calc Af Amer: 42 mL/min — ABNORMAL LOW (ref >90)
GFR calc non Af Amer: 36 mL/min — ABNORMAL LOW (ref >90)

## 2011-12-21 LAB — GLUCOSE, CAPILLARY
Glucose-Capillary: 133 mg/dL — ABNORMAL HIGH (ref 70–99)
Glucose-Capillary: 114 mg/dL — ABNORMAL HIGH (ref 70–99)

## 2011-12-21 LAB — URINE CULTURE: Culture  Setup Time: 201306090509

## 2011-12-21 MED ORDER — HYDRALAZINE HCL 10 MG PO TABS
10.0000 mg | ORAL_TABLET | Freq: Three times a day (TID) | ORAL | Status: DC
  Administered 2011-12-21 – 2011-12-28 (×17): 10 mg via ORAL
  Filled 2011-12-21 (×23): qty 1

## 2011-12-21 MED ORDER — SODIUM CHLORIDE 0.9 % IV SOLN
INTRAVENOUS | Status: AC
  Administered 2011-12-21 (×2): via INTRAVENOUS
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
## 2011-12-21 NOTE — Clinical Documentation Improvement (Signed)
CKD DOCUMENTATION CLARIFICATION QUERY   THIS DOCUMENT IS NOT A PERMANENT PART OF THE MEDICAL RECORD  TO RESPOND TO THE THIS QUERY, FOLLOW THE INSTRUCTIONS BELOW:  1. If needed, update documentation for the patient's encounter via the notes activity.  2. Access this query again and click edit on the In [employer].  3. After updating, or not, click F2 to complete all highlighted (required) fields concerning your review. Select "additional documentation in the medical record" OR "no additional documentation provided".  4. Click Sign note button.  5. The deficiency will fall out of your In Basket *Please let us know if you are not able to complete this workflow by phone or e-mail (listed below).  Please update your documentation within the medical record to reflect your response to this query.                                                                                        12/21/11   Dear Dr. Grier Rocher Marton Redwood,  In a better effort to capture your patient's severity of illness, reflect appropriate length of stay and utilization of resources, a review of the patient medical record has revealed the following indicators.  THANK YOU FOR CLARIFYING "RENAL INSUFFICIENCY" WITH A MEDICARE FRIENDLY TERM.  Possible Clinical Conditions?  - CKD Stage I -  GFR =90 - CKD Stage II - GFR 60-80 - CKD Stage III - GFR 30-59 - CKD Stage IV - GFR 15-29 - CKD Stage V - GFR < 15 - ESRD (End Stage Renal Disease) - Other Condition (please specify) - Cannot Clinically Determine  Supporting Information: - Risk Factors: h/o HTN, h/o CRF/RI, 78yo - BUN/Cr: 6/10: 30/1.57,  6/9: 27/1.80,  6/8 30/1.93 - Calculated GFR: 6/10: 36/42,  6/8: 28/33 - Urine Protein: 6/8: 30 - Treatment: Monitoring renal function, NS IV 124ml/hr x12hr, 6/9 NS IV bolus  You may use possible, probable, or suspect with inpatient documentation. possible, probable, suspected diagnoses MUST be documented at the time of  discharge  Reviewed: additional documentation in the medical record   Thank You,  Beverley Fiedler RN Clinical Documentation Specialist: Pager: [phone] HIM: [phone] Health Information Management [place]
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
## 2011-12-21 NOTE — Progress Notes (Addendum)
CSW received referral for other psychosocial needs. Per chart review and progression discussion pt does not have any further csw needs. Per ED csw note, referral was placed if any other psychosocial needs were to arise. Pt plans to dc home with home health with spouse. Please reconsult csw if further csw needs are identified.   .No further Clinical Social Work needs, signing off.   Catha Gosselin, Theresia Majors  [phone] .12/21/2011 10:10am
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
## 2011-12-22 ENCOUNTER — Inpatient Hospital Stay (HOSPITAL_COMMUNITY): Payer: Medicare Other

## 2011-12-22 DIAGNOSIS — E1165 Type 2 diabetes mellitus with hyperglycemia: Secondary | ICD-10-CM

## 2011-12-22 DIAGNOSIS — R4182 Altered mental status, unspecified: Secondary | ICD-10-CM | POA: Diagnosis not present

## 2011-12-22 DIAGNOSIS — R55 Syncope and collapse: Secondary | ICD-10-CM

## 2011-12-22 DIAGNOSIS — R197 Diarrhea, unspecified: Secondary | ICD-10-CM

## 2011-12-22 DIAGNOSIS — R1310 Dysphagia, unspecified: Secondary | ICD-10-CM | POA: Diagnosis present

## 2011-12-22 DIAGNOSIS — I10 Essential (primary) hypertension: Secondary | ICD-10-CM

## 2011-12-22 LAB — BASIC METABOLIC PANEL
BUN: 24 mg/dL — ABNORMAL HIGH (ref 6–23)
Chloride: 107 mEq/L (ref 96–112)
GFR calc Af Amer: 41 mL/min — ABNORMAL LOW (ref >90)
Potassium: 4.4 mEq/L (ref 3.5–5.1)
Sodium: 139 mEq/L (ref 135–145)

## 2011-12-22 LAB — URINE MICROSCOPIC-ADD ON

## 2011-12-22 LAB — URINALYSIS, ROUTINE W REFLEX MICROSCOPIC
Glucose, UA: NEGATIVE mg/dL
Ketones, ur: NEGATIVE mg/dL
Leukocytes, UA: NEGATIVE
Nitrite: NEGATIVE
Protein, ur: 30 mg/dL — AB
Urobilinogen, UA: 0.2 mg/dL (ref 0.0–1.0)

## 2011-12-22 LAB — GLUCOSE, CAPILLARY: Glucose-Capillary: 130 mg/dL — ABNORMAL HIGH (ref 70–99)

## 2011-12-22 MED ORDER — DEXTROSE 5 % IV SOLN
1.0000 g | INTRAVENOUS | Status: DC
  Administered 2011-12-22: 1 g via INTRAVENOUS
  Filled 2011-12-22 (×2): qty 10

## 2011-12-22 MED ORDER — SODIUM CHLORIDE 0.9 % IV SOLN
INTRAVENOUS | Status: DC
  Administered 2011-12-22 – 2011-12-23 (×2): via INTRAVENOUS

## 2011-12-22 NOTE — Progress Notes (Signed)
CSW spoke with pt Rn, who stated pt need short term rehab stay. Per discussion with pt RN, PT plans to assess patient today for any further needs. Currently patient is recommended for home health pt and ot. CSW awaiting pt assessment today to determine pt disposition needs.   .Clinical social worker continuing to follow pt to assist with pt dc plans and further csw needs.   Catha Gosselin, Theresia Majors  [phone] .12/22/2011 10:06am

## 2011-12-22 NOTE — Progress Notes (Addendum)
Physical Therapy Evaluation Patient Details Name: Shane [name] MRN: [id number] DOB: 02-04-1917 Today's Date: 12/22/2011 Time: 2952-8413 PT Time Calculation (min): 25 min  PT Assessment / Plan / Recommendation Clinical Impression  While this session was not an evaluation, I did re-assess the pt's current status. Mr. Shane [name] has shown significant and concerning decline in cognitive and physical functioning since our last visit. He showed slurred speech; coordination deficits including intention tremor, postural tremor, and dysdiadochokinesia; bilateral LE AROM deficits; motor planning deficits; bilateral LE strength deficits; and continued orthostatic hypotension.Dr. Janee Morn plans to consult Neuro, and pt was transported to imaging after session to get a head CT and chest Xray.     PT Assessment  Patient needs continued PT services    Follow Up Recommendations  Skilled nursing facility    Barriers to Discharge Other (comment) (While pt lives with spouse, his wife is not able to provide appropriate level of mobility assistance currently needed. Pt's recent cognitive and physical decline further complicate d/c plan.)      Equipment Recommendations  Defer to next venue of care        Frequency Min 3X/week    Precautions / Restrictions Precautions Precautions: Fall   Pertinent Vitals/Pain Pt denied pain at beginning of treatment session. Noted discomfort to touch during resisted Right ankle dorsiflexion. Pt cried out and when asked if touch hurt he said yes; no further ankle strength testing was performed and pt's pain subsided. Touch to RLE did not elicit pain later in session  Orthostatics during treatment session:   Beginning session  After moving supine to sit  After coordination testing  After attempted sit-to-stand  End of session   BP  139/57  134/48  121/60  99/50  138/55   HR  59  61  52  57  61   Position  Lying, HOB elevated  Sitting at EOB  Seated at EOB  Seated at EOB   Lying, HOB elevated   RN and MD made aware of BP reading after attempted sit-to-stand.        Mobility  Bed Mobility Bed Mobility: Rolling Right;Right Sidelying to Sit;Sitting - Scoot to [employer] of Bed;Sit to Supine Rolling Right: 1: +2 Total assist;With rail (Pt = <20%, confused on task even after multiple explanations and physical assist to reach for rail) Right Sidelying to Sit: 1: +2 Total assist;With rails Sitting - Scoot to Edge of Bed: Other (comment) (When cued to scoot to EOB, alternated L and R hip flexion but did not physically scoot to edge of bed) Sit to Supine: 1: +2 Total assist (Pt lowered to lying position after low blood pressure reading, repositioned comfortably in bed) Details for Bed Mobility Assistance: Greatly increased assistance required as compared to previous sessions; confusion with tasks; inability to follow cueing for basic bed mobility and transfers.  Transfers Transfers: Sit to Stand;Stand to Sit Sit to Stand: From bed;1: +2 Total assist (Sit-to-stand attempted in order to remove soiled bedpad and replace with clean pad.) Sit to Stand: Patient Percentage: 10% Stand to Sit: 1: +2 Total assist;To bed Details for Transfer Assistance: Pt confused on task, demonstrated posterior lean and pulling walker up off ground before attempting to stand; inability to shift weight forward over toes; unable to follow verbal and tactile cues or demonstration for hand placement and initiation of movement. Unable to completely stand up, but raised several inches off bed for change of bed pad prior to sitting back down.      Pt's  mobility and movement status is significantly reduced from prior session and complicated by reduced cognitive status and marked coordination deficits. RN and MD aware.    PT Diagnosis: Generalized weakness;Altered mental status  PT Problem List: Decreased strength;Decreased range of motion;Decreased activity tolerance;Decreased coordination;Decreased  mobility;Decreased cognition;Decreased safety awareness;Decreased balance PT Treatment Interventions: Gait training;Stair training;DME instruction;Functional mobility training;Therapeutic activities;Balance training;Cognitive remediation;Patient/family education   PT Goals Acute Rehab PT Goals Potential to Achieve Goals: Fair Pt will go Sit to Stand: with modified independence PT Goal: Sit to Stand - Progress: Not progressing Pt will go Stand to Sit: with modified independence PT Goal: Stand to Sit - Progress: Not progressing Pt will Ambulate: 51 - 150 feet;with modified independence PT Goal: Ambulate - Progress: Not progressing Pt will Go Up / Down Stairs: 1-2 stairs;with modified independence PT Goal: Up/Down Stairs - Progress: Not progressing  Visit Information  Last PT Received On: 12/22/11    Subjective Data   I'm okay" (answered with slurred speech and slight difficulty with word finding). Pt's wife present for session, she stated "he seems like he did well today" at end of session. However, nursing stated wife showed concern over her husband's current status previously in the day.     Prior Functioning  Home Living Lives With: Spouse Available Help at Discharge: Family Type of Home: House Home Access: Stairs to enter [employer] of Steps: 2 Entrance Stairs-Rails: Can reach both Home Layout: One level Bathroom Shower/Tub: Tub/shower unit;Walk-in shower Bathroom Toilet: Standard Bathroom Accessibility: Yes How Accessible: Accessible via walker Home Adaptive Equipment: Straight cane Prior Function Level of Independence: Independent Able to Take Stairs?: Yes Driving: Yes Vocation: Retired Musician: Other (comment) (Slurred and mumbling speech) Dominant Hand: Right    Cognition  Overall Cognitive Status: Impaired Area of Impairment: Following commands;);Attention;Problem solving () Arousal/Alertness:  (unable to focus attention, required  continued cueing to stay on task) Orientation Level: Disoriented to;Place;Time (Stated hospital was [facility] and wife cued to say [facility]; stated month as January then February) Current Attention Level: Other (comment) (Unable to focus on activities of session) Attention - Other Comments: significantly decr attention level as compared to prior sessions Following Commands:  (Unable to follow one-step commands such as hand placement on EOB for sit-to-stand) Problem Solving: Unable to problem solve and demonstrate motor planning for mobility such as rolling to side and sitting up at EOB.     Extremity/Trunk Assessment Right Upper Extremity Assessment RUE ROM/Strength/Tone: Deficits RUE ROM/Strength/Tone Deficits: Difficulty and shakiness with lifting arms straight up in front of him RUE Coordination: Deficits RUE Coordination Deficits: Dysmetria and intention tremor noted on finger-to-nose test; slight postural tremor noted with arms extended in front; positive for dysdiadochokinesia  Left Upper Extremity Assessment LUE ROM/Strength/Tone: Deficits LUE ROM/Strength/Tone Deficits: Difficulty and shakiness with lifting arms straight up in front of him and maintaining Left elbow extension with Left shoulder flexion  LUE Coordination: Deficits LUE Coordination Deficits: Dysmetria and intention tremor noted with finger-to-nose test; slight postural tremor seen with both arms extended in front of body; positive for dysdiadochokinesia. Right Lower Extremity Assessment RLE ROM/Strength/Tone: Deficits RLE ROM/Strength/Tone Deficits: Unable to follow cueing for ankle AROM; difficulty isolating RLE movements from LLE movements. Decreased hip/knee flexion AROM as compared to initial eval; grossly 4+/5 strength for hip flexion and knee extension with noted posterior lean compensation. Left Lower Extremity Assessment LLE ROM/Strength/Tone: Deficits LLE ROM/Strength/Tone Deficits: Unable to follow cueing and  demonstration for ankle AROM; grossly 4+/5 strength for hip flexion and  knee extension with noted posterior lean compensation. Trunk Assessment Trunk Assessment: Kyphotic;Other exceptions (Right lateral lean in sitting at EOB)   Balance Balance Balance Assessed: Yes Static Sitting Balance Static Sitting - Balance Support: Bilateral upper extremity supported;Feet supported (significant right lean) Static Sitting - Level of Assistance: 3: Mod assist (right lean and posterior lean) Static Sitting - Comment/# of Minutes: 4 Pt able to correct right lean with cueing, but returned to right lean when asked to do any other movement or task   End of Session PT - End of Session Equipment Utilized During Treatment: Gait belt Activity Tolerance: Other (comment) (treatment limited by deconditioning and cognitive and physical decline of unknown cause; significant orthostasis also necessitated return to lying in bed and end of treatment session.) Patient left: in bed;with family/visitor present;with call bell/phone within reach (Transport arrived to take pt to imaging) Nurse Communication: Mobility status;Other (comment) (requested neuro checks every hour; nursing stated they have already started and will be continuing these.)   Olen Pel [place], [place] [phone]  12/22/2011, 3:20 PM  MD cleared PT for activity prior to beginning session.

## 2011-12-22 NOTE — Progress Notes (Signed)
UR Completed Mayson Sterbenz Graves-Bigelow, RN,BSN [phone]  

## 2011-12-22 NOTE — Clinical Social Work Psychosocial (Signed)
     Clinical Social Work Department BRIEF PSYCHOSOCIAL ASSESSMENT 12/22/2011  Patient:  [facility]     Account Number:  [id number]     Admit date:  12/19/2011  Clinical Social Worker:  Doree Albee  Date/Time:  12/22/2011 03:20 PM  Referred by:  RN  Date Referred:  12/22/2011 Referred for  SNF Placement   Other Referral:   Interview type:  Patient Other interview type:   and pt spouse at bedside    PSYCHOSOCIAL DATA Living Status:  FAMILY Admitted from facility:   Level of care:   Primary support name:  Francesca Jewett Primary support relationship to patient:  SPOUSE Degree of support available:   strong, as well as pt niece Georgiann Mccoy (patient's niece    CURRENT CONCERNS Current Concerns  Post-Acute Placement   Other Concerns:    SOCIAL WORK ASSESSMENT / PLAN CSW met with pt and pt family at bedside which inluded pt spouse, pt neice, and pt nephew to discuss pt current living environment and plans of short term rehab for snf as recommended by MD.    Per discussion with MD, pt needs placement for short term rehab. Per discussion with pt MD, PT had also seen a dramatic decline and support placement for short term rehab.    Pt is open to short term rehab in [place] in order to hopefully return home with pt spouse. Pt spouse is able to provide 24 hour support for ptienat, however pt realizes he needs to increase strenght.    CSW explained csw role and placement process for short term rehab.  CSW also provided pt with list of skiled nursing facilities for rehab.  Please see placement note for placement progress.   Assessment/plan status:  Psychosocial Support/Ongoing Assessment of Needs Other assessment/ plan:   Information/referral to community resources:   Skilled nursing facility list    PATIENTS/FAMILYS RESPONSE TO PLAN OF CARE: Pt and pt family appreciated csw concern and support and assistance with rehab placement. Pt is motivated to  partipate in rehab to return home.

## 2011-12-22 NOTE — Progress Notes (Signed)
Physical Therapy Treatment Patient Details Name: Shane [name] MRN: [id number] DOB: 13-May-1917 Today's Date: 12/22/2011 Time:  -    Full PT Assessment note to follow.  Orthostatics during treatment session:   Beginning session After moving supine to sit After coordination testing After attempted sit-to-stand End of session  BP 139/57 134/48 121/60 99/50 138/55  HR 59 61 52 57 61  Position Lying, HOB elevated Sitting at EOB Seated at EOB Seated at EOB Lying, HOB elevated   RN and MD made aware of BP reading after attempted sit-to-stand.   Van Clines [place], [place] [phone]  12/22/2011, 2:44 PM
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
## 2011-12-22 NOTE — Progress Notes (Signed)
UR Completed [place] Graves-Bigelow, RN,BSN [phone]  

## 2011-12-22 NOTE — Clinical Social Work Placement (Addendum)
     Clinical Social Work Department CLINICAL SOCIAL WORK PLACEMENT NOTE 12/28/2011  Patient:  [facility]  Account Number:  [id number] Admit date:  12/19/2011  Clinical Social Worker:  Doree Albee  Date/time:  12/22/2011 03:27 PM  Clinical Social Work is seeking post-discharge placement for this patient at the following level of care:   SKILLED NURSING   (*CSW will update this form in Epic as items are completed)   12/22/2011  Patient/family provided with [facility] Department of Clinical Social Works list of facilities offering this level of care within the geographic area requested by the patient (or if unable, by the patients family).  12/22/2011  Patient/family informed of their freedom to choose among providers that offer the needed level of care, that participate in Medicare, Medicaid or managed care program needed by the patient, have an available bed and are willing to accept the patient.  12/22/2011  Patient/family informed of [facility] ownership interest in [facility], as well as of the fact that they are under no obligation to receive care at this facility.  PASARR submitted to EDS on 12/22/2011 PASARR number received from EDS on 12/23/2011  FL2 transmitted to all facilities in geographic area requested by pt/family on  12/22/2011 FL2 transmitted to all facilities within larger geographic area on   Patient informed that his/her managed care company has contracts with or will negotiate with  certain facilities, including the following:     Patient/family informed of bed offers received:  12/25/2011 Patient chooses bed at [facility] Physician recommends and patient chooses bed at    Patient to be transferred to [facility] on  12/28/2011 Patient to be transferred to facility by [facility]  The following physician request were entered in Epic:   Additional Comments:

## 2011-12-23 DIAGNOSIS — R55 Syncope and collapse: Secondary | ICD-10-CM

## 2011-12-23 DIAGNOSIS — R197 Diarrhea, unspecified: Secondary | ICD-10-CM

## 2011-12-23 DIAGNOSIS — R509 Fever, unspecified: Secondary | ICD-10-CM

## 2011-12-23 DIAGNOSIS — R4182 Altered mental status, unspecified: Secondary | ICD-10-CM

## 2011-12-23 DIAGNOSIS — I10 Essential (primary) hypertension: Secondary | ICD-10-CM

## 2011-12-23 LAB — BASIC METABOLIC PANEL
Calcium: 8.3 mg/dL — ABNORMAL LOW (ref 8.4–10.5)
Creatinine, Ser: 1.9 mg/dL — ABNORMAL HIGH (ref 0.50–1.35)
GFR calc non Af Amer: 29 mL/min — ABNORMAL LOW (ref >90)
Sodium: 138 mEq/L (ref 135–145)

## 2011-12-23 LAB — CBC
MCH: 26.6 pg (ref 26.0–34.0)
MCV: 81.7 fL (ref 78.0–100.0)
Platelets: 168 10*3/uL (ref 150–400)
RBC: 3.38 MIL/uL — ABNORMAL LOW (ref 4.22–5.81)
RDW: 16 % — ABNORMAL HIGH (ref 11.5–15.5)
WBC: 8.3 10*3/uL (ref 4.0–10.5)

## 2011-12-23 LAB — GLUCOSE, CAPILLARY

## 2011-12-23 MED ORDER — DEXTROSE 5 % IV SOLN
1.0000 g | INTRAVENOUS | Status: DC
  Administered 2011-12-23 – 2011-12-24 (×2): 1 g via INTRAVENOUS
  Filled 2011-12-23 (×3): qty 1

## 2011-12-23 MED ORDER — VANCOMYCIN HCL IN DEXTROSE 1-5 GM/200ML-% IV SOLN
1000.0000 mg | INTRAVENOUS | Status: DC
  Administered 2011-12-23: 1000 mg via INTRAVENOUS
  Filled 2011-12-23 (×2): qty 200

## 2011-12-23 MED ORDER — SODIUM CHLORIDE 0.9 % IV SOLN: INTRAVENOUS | Status: DC

## 2011-12-23 MED ORDER — IBUPROFEN 800 MG PO TABS
800.0000 mg | ORAL_TABLET | Freq: Once | ORAL | Status: AC
  Administered 2011-12-23: 800 mg via ORAL
  Filled 2011-12-23: qty 1

## 2011-12-23 NOTE — Progress Notes (Signed)
Attempted to get pt OOB to no avail. Pt unable to carry his weight even with 2 assist. Ancil Linsey RN
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
## 2011-12-23 NOTE — Consult Note (Signed)
ANTIBIOTIC CONSULT NOTE - INITIAL  Pharmacy Consult for Vancomycin + Cefepime Indication: Fever, AMS  No Known Allergies  Patient Measurements: Height: 5\' 8"  (172.7 cm) Weight: 145 lb 9 oz (66.027 kg) IBW/kg (Calculated) : 68.4   Vital Signs: Temp: 98.2 F (36.8 C) (06/12 0400) Temp src: Oral (06/12 0400) BP: 155/54 mmHg (06/12 0400) Pulse Rate: 50  (06/12 0400) Intake/Output from previous day: 06/11 0701 - 06/12 0700 In: 240 [P.O.:240] Out: 900 [Urine:900] Intake/Output from this shift:    Labs:  Basename 12/23/11 0500 12/22/11 0500 12/21/11 0041  WBC 8.3 -- --  HGB 9.0* -- --  PLT 168 -- --  LABCREA -- -- --  CREATININE 1.90* 1.61* 1.57*   Estimated Creatinine Clearance: 22.7 ml/min (by C-G formula based on Cr of 1.9).  Medical History: Past Medical History  Diagnosis Date  . Dental caries   . Hypertension   . Aortic stenosis     moderate by echo 1/13  . Peripheral vascular disease   . Venous insufficiency   . Diabetes mellitus   . Diverticulosis of colon   . History of colonic polyps   . Renal insufficiency   . Adenocarcinoma of prostate   . Chronic low back pain   . History of syncope   . Peripheral neuropathy   . Sebaceous cyst   . Anemia   . Hyperkalemia 12/19/2011   Assessment: 89yom admitted 6/8 with syncope developed a fever last night to 103 and became increasingly confused and lethargic.  Ceftriaxone 1gm was given.  Blood and urine cultures are in progress. Patient has a history of renal insufficiency and sCr is elevated but stable with CrCl ~ 81ml/min.  Given advanced age, will dose more conservatively.  Goal of Therapy:  Vancomycin trough level 15-20 mcg/ml Renal dosing of Cefepime  Plan:  1) Vancomycin 1g IV q48 2) Cefepime 1g IV q24 3) Follow renal function, cultures, levels as indicated  Fredrik Rigger 12/23/2011,9:04 AM

## 2011-12-23 NOTE — Progress Notes (Signed)
Called by bedside RN regarding patient's confusion. Upon assessment patient is alert, pleasantly confused of place and time, HR 60-70s, SBP 140-160s, 96% on RA, oral temp 103 after receiving tylenol at 2216 for an oral temp of 101.5. Blood cultures &  UA/culture were drawn earlier in the shift, rocephin was ordered at that time. Ice packs applied to patient, attending NP notified, ibuprofen 800 mg ordered. First dose of Rocephin administered at 2326. Advised bedside RN to call with any further issues, will continue to monitor

## 2011-12-23 NOTE — Progress Notes (Signed)
Subjective: Patient had no complaints.  Objective: Current vital signs: BP 149/74  Pulse 50  Temp 97.8 F (36.6 C) (Oral)  Resp 20  Ht 5\' 8"  (1.727 m)  Wt 66.027 kg (145 lb 9 oz)  BMI 22.13 kg/m2  SpO2 98%  Neurologic Exam: Afebrile. Alert and oriented x3, and in no acute distress. Patient was eating breakfast including feeding himself. He had no difficulty following simple commands. There was no facial weakness. Speech was normal, as was swallowing. He moved extremities equally without difficulty.  Lab Results: Results for orders placed during the hospital encounter of 12/19/11 (from the past 48 hour(s))  CARDIAC PANEL(CRET KIN+CKTOT+MB+TROPI)     Status: Abnormal   Collection Time   12/21/11  4:57 PM      Component Value Range Comment   Total CK 886 (*) 7 - 232 U/L    CK, MB 2.9  0.3 - 4.0 ng/mL    Troponin I <0.30  <0.30 ng/mL    Relative Index 0.3  0.0 - 2.5   BASIC METABOLIC PANEL     Status: Abnormal   Collection Time   12/22/11  5:00 AM      Component Value Range Comment   Sodium 139  135 - 145 mEq/L    Potassium 4.4  3.5 - 5.1 mEq/L    Chloride 107  96 - 112 mEq/L    CO2 22  19 - 32 mEq/L    Glucose, Bld 147 (*) 70 - 99 mg/dL    BUN 24 (*) 6 - 23 mg/dL    Creatinine, Ser 8.11 (*) 0.50 - 1.35 mg/dL    Calcium 8.6  8.4 - 91.4 mg/dL    GFR calc non Af Amer 35 (*) >90 mL/min    GFR calc Af Amer 41 (*) >90 mL/min   GLUCOSE, CAPILLARY     Status: Abnormal   Collection Time   12/22/11  7:28 AM      Component Value Range Comment   Glucose-Capillary 130 (*) 70 - 99 mg/dL    Comment 1 Notify RN     URINALYSIS, ROUTINE W REFLEX MICROSCOPIC     Status: Abnormal   Collection Time   12/22/11  4:12 PM      Component Value Range Comment   Color, Urine YELLOW  YELLOW    APPearance CLEAR  CLEAR    Specific Gravity, Urine 1.016  1.005 - 1.030    pH 5.0  5.0 - 8.0    Glucose, UA NEGATIVE  NEGATIVE mg/dL    Hgb urine dipstick NEGATIVE  NEGATIVE    Bilirubin Urine NEGATIVE   NEGATIVE    Ketones, ur NEGATIVE  NEGATIVE mg/dL    Protein, ur 30 (*) NEGATIVE mg/dL    Urobilinogen, UA 0.2  0.0 - 1.0 mg/dL    Nitrite NEGATIVE  NEGATIVE    Leukocytes, UA NEGATIVE  NEGATIVE   URINE MICROSCOPIC-ADD ON     Status: Abnormal   Collection Time   12/22/11  4:12 PM      Component Value Range Comment   Squamous Epithelial / LPF FEW (*) RARE    WBC, UA 0-2  <3 WBC/hpf    Bacteria, UA MANY (*) RARE    Casts GRANULAR CAST (*) NEGATIVE   BASIC METABOLIC PANEL     Status: Abnormal   Collection Time   12/23/11  5:00 AM      Component Value Range Comment   Sodium 138  135 - 145 mEq/L  Potassium 4.3  3.5 - 5.1 mEq/L    Chloride 107  96 - 112 mEq/L    CO2 22  19 - 32 mEq/L    Glucose, Bld 125 (*) 70 - 99 mg/dL    BUN 29 (*) 6 - 23 mg/dL    Creatinine, Ser 1.61 (*) 0.50 - 1.35 mg/dL    Calcium 8.3 (*) 8.4 - 10.5 mg/dL    GFR calc non Af Amer 29 (*) >90 mL/min    GFR calc Af Amer 33 (*) >90 mL/min   CBC     Status: Abnormal   Collection Time   12/23/11  5:00 AM      Component Value Range Comment   WBC 8.3  4.0 - 10.5 K/uL    RBC 3.38 (*) 4.22 - 5.81 MIL/uL    Hemoglobin 9.0 (*) 13.0 - 17.0 g/dL    HCT 09.6 (*) 04.5 - 52.0 %    MCV 81.7  78.0 - 100.0 fL    MCH 26.6  26.0 - 34.0 pg    MCHC 32.6  30.0 - 36.0 g/dL    RDW 40.9 (*) 81.1 - 15.5 %    Platelets 168  150 - 400 K/uL   GLUCOSE, CAPILLARY     Status: Abnormal   Collection Time   12/23/11  7:24 AM      Component Value Range Comment   Glucose-Capillary 114 (*) 70 - 99 mg/dL    Comment 1 Notify RN       Studies/Results: Dg Chest 1 View  12/22/2011  *RADIOLOGY REPORT*  Clinical Data: Cough and shortness of breath.  Evaluate for potential pneumonia.  CHEST - 1 VIEW  Comparison: Chest x-ray 12/19/2011.  Findings: Lung volumes are low.  No consolidative airspace disease. No pleural effusions.  No pneumothorax.  No pulmonary nodule or mass noted.  Pulmonary vasculature and the cardiomediastinal silhouette are within  normal limits.  Atherosclerotic calcifications within the arch of the aorta.  IMPRESSION: 1.  Low lung volumes without radiographic evidence of acute cardiopulmonary disease. 2.  Atherosclerosis.  Original Report Authenticated By: Florencia Reasons, M.D.   Ct Head Wo Contrast  12/22/2011  *RADIOLOGY REPORT*  Clinical Data: Syncope.  High blood pressure.  Diabetes.  Evaluate for infarct.  CT HEAD WITHOUT CONTRAST  Technique:  Contiguous axial images were obtained from the base of the skull through the vertex without contrast.  Comparison: 12/20/2011 MR.  12/19/2011 CT.  Findings: No intracranial hemorrhage.  Prominent small vessel disease type changes without CT evidence of large acute infarct.  Global atrophy.  Ventricular prominence may be related to atrophy although difficult to exclude a mild component of hydrocephalus. Appearance is unchanged.  Paranasal sinus mucosal thickening most notable right sphenoid sinus where there is surrounding bony thickening suggesting changes of chronic sinusitis.  IMPRESSION: Prominent small vessel disease type changes without CT evidence of large acute infarct.  No intracranial hemorrhage.  Global atrophy with ventricular prominence unchanged.  Paranasal sinus mucosal thickening/partial opacification most notable right sphenoid sinus air cell with changes of chronic sinusitis.  Vascular calcifications.  Original Report Authenticated By: Fuller Canada, M.D.    Medications:  Scheduled:   . aspirin  325 mg Oral Daily  . ceFEPime (MAXIPIME) IV  1 g Intravenous Q24H  . clotrimazole   Topical BID  . vitamin B-12  500 mcg Oral Daily  . enoxaparin (LOVENOX) injection  30 mg Subcutaneous Q24H  . ferrous sulfate  325 mg Oral Q breakfast  .  hydrALAZINE  10 mg Oral Q8H  . ibuprofen  800 mg Oral Once  . isosorbide mononitrate  30 mg Oral Daily  . NIFEdipine  60 mg Oral Daily  . senna-docusate  1 tablet Oral Daily  . Tamsulosin HCl  0.4 mg Oral QHS  . vancomycin  1,000  mg Intravenous Q48H  . vitamin C  500 mg Oral Daily  . DISCONTD: cefTRIAXone (ROCEPHIN)  IV  1 g Intravenous Q24H  . DISCONTD: losartan  50 mg Oral Daily  . DISCONTD: sodium chloride  3 mL Intravenous Q12H   AVW:[id number], acetaminophen, alum & mag hydroxide-simeth, hydrALAZINE, magnesium citrate, meclizine, nitroGLYCERIN, ondansetron (ZOFRAN) IV, ondansetron, oxyCODONE  Assessment/Plan: Encephalopathy with altered mental status, resolved. Etiology is unclear about was at least in part related to patient's febrile state.  Plan: No further neurological intervention is indicated since patient is no longer febrile and mental status has returned to normal. There is no indication of dementia at this point. I will sign off on his care at this point, but remain available for followup evaluation, if indicated.  C.R. Roseanne Reno, MD [facility] [phone]  12/23/2011  1:07 PM

## 2011-12-23 NOTE — Progress Notes (Signed)
Multiple problems since beginning of night shift: 1)  Temp from 102.5 to 103 after acetaminophen 650mg  PO, currently 98.2 after ibuprofen 800mg  PO and ceftriaxone 1gm IV. 2)  Increasing lethargy and confusion with global weakness, slurred speech and variable cognition; assessed per neurology. 3)  Pain with touch or movement of both lower extremities, worse on right than left; TED hose were removed for patient comfort at 20:00.  Not reapplied; patient on Lovenox. 4)  Swelling of both hands, reported on day shift yesterday, worsening with involvement of ring currently on left ring finger.  Capillary refill preserved, but ring is not able to be removed using emolients or ice.  Elevating extremity and IVF reduced from 147ml//hr to [facility] per orders.  NP on-call for [facility] made aware of all findings; ordered interventions have been underway since 22:00 with some improvement.  Continuing to closely monitor.

## 2011-12-23 NOTE — Progress Notes (Signed)
Occupational Therapy Treatment Patient Details Name: Shane [name] MRN: [id number] DOB: 1916-11-04 Today's Date: 12/23/2011 Time: 5621-3086 OT Time Calculation (min): 64 min  OT Assessment / Plan / Recommendation Comments on Treatment Session Pt. BP 152/90 at beginning of session and 149/74 at end of session. Pt. required verbal cues for problem solving. Pt. HR dropped in 40's while eating breakfast but trailed up nicely. Pt. had incoordination and limited ROM in UEs making bringing food to mouth difficult. Pt. had slurred speech but was oriented.     Follow Up Recommendations  Home health OT    Barriers to Discharge       Equipment Recommendations  Rolling walker with 5" wheels;Tub/shower bench    Recommendations for Other Services    Frequency Min 2X/week   Plan Discharge plan remains appropriate    Precautions / Restrictions Precautions Precautions: Fall Restrictions Weight Bearing Restrictions: No   Pertinent Vitals/Pain Pt. HR dropped in 40's while eating but trailed up nicely. BP 152/90 at beginning of session and 149/74 at end of session while sitting in bed. Reported No Pain.    ADL  Eating/Feeding: Performed;Minimal assistance Where Assessed - Eating/Feeding: Bed level Grooming: Performed;Wash/dry face;Set up Where Assessed - Grooming: Supported sitting ADL Comments: Pt. was eating breakfast when OT arrived in room. He required Min A and required verbal cues as well as hand over hand A due to problem solving deficits, incoordination of UE, and inappropriate use of tools. Pt. had difficulty distinguishing spoon from straw.  He had difficulty raising arm to mouth due to limited ROM in UE and incoordination deficits. Pt. was able to wash face with SetupA. He also had slurred speech. Pt. was oriented. Pt. demonstrated deficits that would affect UB dressing and UB bathing due to limited ROM and incoordination in UEs.          OT Goals Acute Rehab OT Goals OT Goal  Formulation: With patient Time For Goal Achievement: 01/04/12 Potential to Achieve Goals: Fair ADL Goals Pt Will Perform Upper Body Bathing: with supervision;Sitting in shower;Supported ADL Goal: Product manager - Progress: Not progressing Pt Will Perform Lower Body Bathing: Sit to stand in shower;Supported;with min assist Pt Will Perform Upper Body Dressing: with supervision;Sitting, chair;Supported ADL Goal: Location manager Dressing - Progress: Not progressing Pt Will Perform Lower Body Dressing: with min assist;Sit to stand from chair;Supported Engineer, water to Toilet: with supervision;Regular height toilet;Grab bars Pt Will Perform Toileting - Clothing Manipulation: with supervision;Sitting on 3-in-1 or toilet Pt Will Perform Toileting - Hygiene: Leaning right and/or left on 3-in-1/toilet;with supervision Pt Will Perform Tub/Shower Transfer: with supervision;Transfer tub bench  Visit Information  Last OT Received On: 12/23/11 Assistance Needed: +1    Subjective Data   "We have been married 75 years."   Prior Functioning       Cognition  Overall Cognitive Status: Impaired Area of Impairment: Problem solving Arousal/Alertness: Awake/alert Orientation Level: Appears intact for tasks assessed Current Attention Level: Sustained Problem Solving: difficulty problem solving which utensil to use as well as getting food on utensil.                 End of Session OT - End of Session Activity Tolerance: Patient tolerated treatment well Patient left: in bed;with call bell/phone within reach   Jenell Milliner 12/23/2011, 10:23 AM   Lucile Shutters   OTR/L Pager: [phone] Office: [phone] .

## 2011-12-23 NOTE — Progress Notes (Signed)
Pt family member concerned about pain on both ankles on dorsiflexion. Kindly address. Pt would like to speak with you and be updated with his prognosis. Thank you ! Ancil Linsey Rn

## 2011-12-23 NOTE — Progress Notes (Signed)
Speech Language Pathology Dysphagia Treatment Patient Details Name: Shane [name] MRN: [id number] DOB: [birth year] Today's Date: 12/23/2011 Time:  -     Assessment / Plan / Recommendation Clinical Impression Patient is tolerating a Dysphagia 3 diet with thin liquids well.       Diet Recommendation Continue Dys 3 with thin liquids      SLP Plan:  D/C SLP as all goals met     Pertinent Vitals/Pain n/a       General  Patient took consecutive swallows of water via a straw without over s/s of aspiration.  Good laryngeal elevation palpated and voice clear after swallows.  Lung sounds are diminished but clear, and patient is afebrile.  No complaints noted.         Maryjo Rochester T 12/23/2011, 4:10 PM
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
## 2011-12-24 DIAGNOSIS — R509 Fever, unspecified: Secondary | ICD-10-CM

## 2011-12-24 DIAGNOSIS — R55 Syncope and collapse: Secondary | ICD-10-CM

## 2011-12-24 DIAGNOSIS — I10 Essential (primary) hypertension: Secondary | ICD-10-CM

## 2011-12-24 DIAGNOSIS — R197 Diarrhea, unspecified: Secondary | ICD-10-CM

## 2011-12-24 LAB — GLUCOSE, CAPILLARY: Glucose-Capillary: 112 mg/dL — ABNORMAL HIGH (ref 70–99)

## 2011-12-24 LAB — URINE CULTURE

## 2011-12-24 MED ORDER — DEXTROSE 5 % IV SOLN
1.0000 g | INTRAVENOUS | Status: DC
  Administered 2011-12-24: 1 g via INTRAVENOUS
  Filled 2011-12-24 (×2): qty 10
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
## 2011-12-24 NOTE — Progress Notes (Signed)
CSW met with pt at bedside to provide bed offers. Pt asked csw to discuss further with pt wife as she would be able to make the best decision regarding which snf for short term rehab would be best. CSW left message for patient wife. Pt had also given csw permission to discuss with pt niece. CSW was unable to reach pt niece or leave a message due to pt niece phone line being busy.   Per discussion with pt RN, pt may be medically stable for discharge today. CSW will continue to try to reach pt spouse and niece regarding pt discharge plans.   .Clinical social worker continuing to follow pt to assist with pt dc plans and further csw needs.   Catha Gosselin, Theresia Majors  [phone] .12/24/2011 9:48am

## 2011-12-25 ENCOUNTER — Telehealth: Payer: Self-pay | Admitting: Pulmonary Disease

## 2011-12-25 ENCOUNTER — Inpatient Hospital Stay (HOSPITAL_COMMUNITY): Payer: Medicare Other

## 2011-12-25 DIAGNOSIS — E782 Mixed hyperlipidemia: Secondary | ICD-10-CM

## 2011-12-25 DIAGNOSIS — R509 Fever, unspecified: Secondary | ICD-10-CM

## 2011-12-25 DIAGNOSIS — R55 Syncope and collapse: Secondary | ICD-10-CM

## 2011-12-25 DIAGNOSIS — R197 Diarrhea, unspecified: Secondary | ICD-10-CM

## 2011-12-25 LAB — BASIC METABOLIC PANEL
GFR calc Af Amer: 28 mL/min — ABNORMAL LOW (ref >90)
GFR calc non Af Amer: 24 mL/min — ABNORMAL LOW (ref >90)
Potassium: 4.1 mEq/L (ref 3.5–5.1)
Sodium: 139 mEq/L (ref 135–145)

## 2011-12-25 MED ORDER — CEFTRIAXONE SODIUM 1 G IJ SOLR
1.0000 g | INTRAMUSCULAR | Status: DC
  Administered 2011-12-25: 1 g via INTRAVENOUS
  Filled 2011-12-25 (×2): qty 10

## 2011-12-25 MED ORDER — IOHEXOL 300 MG/ML  SOLN
20.0000 mL | INTRAMUSCULAR | Status: AC
  Administered 2011-12-25 (×2): 20 mL via ORAL

## 2011-12-25 MED ORDER — SODIUM CHLORIDE 0.9 % IV SOLN
INTRAVENOUS | Status: DC
  Administered 2011-12-25: 11:00:00 via INTRAVENOUS

## 2011-12-25 NOTE — Progress Notes (Signed)
Physical Therapy Note  Pt seen by Rose Fillers, PT earlier today, but no progress note was created. See Doc Flowsheets for details. Continues to recommend SNF for d/c and still requires +2 to transfer.   12/25/2011 Veda Canning, PT Pager: [phone]

## 2011-12-25 NOTE — Telephone Encounter (Signed)
Called and spoke with pts wife and and stated that the pt is in the hospital and she is aware that there are no referrals in the system for the pt to see neurology.  She stated that they are checking his head out today while he is in the hospital.  She is aware that the hospital will set him up with any appts needed before they discharge him home.  pts wife voiced her understanding of this.
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
## 2011-12-25 NOTE — Progress Notes (Signed)
Utilization review completed.  
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
## 2011-12-26 ENCOUNTER — Inpatient Hospital Stay (HOSPITAL_COMMUNITY): Payer: Medicare Other

## 2011-12-26 DIAGNOSIS — R509 Fever, unspecified: Secondary | ICD-10-CM

## 2011-12-26 DIAGNOSIS — R55 Syncope and collapse: Secondary | ICD-10-CM

## 2011-12-26 DIAGNOSIS — R197 Diarrhea, unspecified: Secondary | ICD-10-CM

## 2011-12-26 DIAGNOSIS — E782 Mixed hyperlipidemia: Secondary | ICD-10-CM

## 2011-12-26 LAB — CBC
Hemoglobin: 9 g/dL — ABNORMAL LOW (ref 13.0–17.0)
MCHC: 33.8 g/dL (ref 30.0–36.0)
RBC: 3.35 MIL/uL — ABNORMAL LOW (ref 4.22–5.81)

## 2011-12-26 LAB — BASIC METABOLIC PANEL
BUN: 38 mg/dL — ABNORMAL HIGH (ref 6–23)
GFR calc Af Amer: 32 mL/min — ABNORMAL LOW (ref >90)
GFR calc non Af Amer: 27 mL/min — ABNORMAL LOW (ref >90)
Potassium: 4.6 mEq/L (ref 3.5–5.1)
Sodium: 137 mEq/L (ref 135–145)

## 2011-12-26 LAB — GLUCOSE, CAPILLARY: Glucose-Capillary: 147 mg/dL — ABNORMAL HIGH (ref 70–99)

## 2011-12-26 MED ORDER — ALBUTEROL SULFATE (5 MG/ML) 0.5% IN NEBU
2.5000 mg | INHALATION_SOLUTION | RESPIRATORY_TRACT | Status: DC | PRN
  Administered 2011-12-26: 2.5 mg via RESPIRATORY_TRACT
  Filled 2011-12-26: qty 0.5

## 2011-12-26 MED ORDER — LEVOFLOXACIN IN D5W 500 MG/100ML IV SOLN
500.0000 mg | INTRAVENOUS | Status: DC
  Administered 2011-12-26: 500 mg via INTRAVENOUS
  Filled 2011-12-26 (×2): qty 100
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
## 2011-12-27 DIAGNOSIS — R509 Fever, unspecified: Secondary | ICD-10-CM

## 2011-12-27 DIAGNOSIS — N184 Chronic kidney disease, stage 4 (severe): Secondary | ICD-10-CM | POA: Diagnosis present

## 2011-12-27 DIAGNOSIS — R55 Syncope and collapse: Secondary | ICD-10-CM

## 2011-12-27 DIAGNOSIS — R197 Diarrhea, unspecified: Secondary | ICD-10-CM

## 2011-12-27 DIAGNOSIS — E782 Mixed hyperlipidemia: Secondary | ICD-10-CM

## 2011-12-27 LAB — BASIC METABOLIC PANEL
BUN: 33 mg/dL — ABNORMAL HIGH (ref 6–23)
Calcium: 8.6 mg/dL (ref 8.4–10.5)
GFR calc Af Amer: 35 mL/min — ABNORMAL LOW (ref >90)
GFR calc non Af Amer: 30 mL/min — ABNORMAL LOW (ref >90)
Potassium: 4.7 mEq/L (ref 3.5–5.1)

## 2011-12-27 LAB — CBC
Hemoglobin: 9.1 g/dL — ABNORMAL LOW (ref 13.0–17.0)
MCHC: 34.1 g/dL (ref 30.0–36.0)
Platelets: 240 10*3/uL (ref 150–400)
RDW: 16 % — ABNORMAL HIGH (ref 11.5–15.5)

## 2011-12-27 LAB — GLUCOSE, CAPILLARY: Glucose-Capillary: 134 mg/dL — ABNORMAL HIGH (ref 70–99)

## 2011-12-27 MED ORDER — PANTOPRAZOLE SODIUM 40 MG PO TBEC
40.0000 mg | DELAYED_RELEASE_TABLET | Freq: Every day | ORAL | Status: DC
  Administered 2011-12-27 – 2011-12-28 (×2): 40 mg via ORAL
  Filled 2011-12-27 (×2): qty 1

## 2011-12-27 MED ORDER — LEVOFLOXACIN 250 MG PO TABS
250.0000 mg | ORAL_TABLET | Freq: Every day | ORAL | Status: DC
  Administered 2011-12-27: 250 mg via ORAL
  Filled 2011-12-27 (×2): qty 1
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
## 2011-12-28 ENCOUNTER — Inpatient Hospital Stay (HOSPITAL_COMMUNITY): Payer: Medicare Other

## 2011-12-28 DIAGNOSIS — R197 Diarrhea, unspecified: Secondary | ICD-10-CM

## 2011-12-28 DIAGNOSIS — E782 Mixed hyperlipidemia: Secondary | ICD-10-CM

## 2011-12-28 DIAGNOSIS — R55 Syncope and collapse: Secondary | ICD-10-CM

## 2011-12-28 DIAGNOSIS — R509 Fever, unspecified: Secondary | ICD-10-CM

## 2011-12-28 LAB — BASIC METABOLIC PANEL
CO2: 24 mEq/L (ref 19–32)
Chloride: 105 mEq/L (ref 96–112)
Glucose, Bld: 110 mg/dL — ABNORMAL HIGH (ref 70–99)
Potassium: 4.7 mEq/L (ref 3.5–5.1)
Sodium: 137 mEq/L (ref 135–145)

## 2011-12-28 LAB — GLUCOSE, CAPILLARY: Glucose-Capillary: 159 mg/dL — ABNORMAL HIGH (ref 70–99)

## 2011-12-28 LAB — CBC
Hemoglobin: 9 g/dL — ABNORMAL LOW (ref 13.0–17.0)
MCH: 26.4 pg (ref 26.0–34.0)
MCV: 81.2 fL (ref 78.0–100.0)
RBC: 3.41 MIL/uL — ABNORMAL LOW (ref 4.22–5.81)

## 2011-12-28 MED ORDER — INSULIN ASPART 100 UNIT/ML ~~LOC~~ SOLN: 0.0000 [IU] | Freq: Three times a day (TID) | SUBCUTANEOUS | Status: DC

## 2011-12-28 MED ORDER — HYDRALAZINE HCL 25 MG PO TABS: 25.0000 mg | ORAL_TABLET | Freq: Three times a day (TID) | ORAL | Status: DC

## 2011-12-28 MED ORDER — PANTOPRAZOLE SODIUM 40 MG PO TBEC: 40.0000 mg | DELAYED_RELEASE_TABLET | Freq: Every day | ORAL | Status: DC

## 2011-12-28 MED ORDER — NIFEDIPINE ER 30 MG PO TB24
30.0000 mg | ORAL_TABLET | Freq: Every day | ORAL | Status: DC
  Administered 2011-12-28: 30 mg via ORAL
  Filled 2011-12-28: qty 1

## 2011-12-28 MED ORDER — HYDRALAZINE HCL 25 MG PO TABS
25.0000 mg | ORAL_TABLET | Freq: Three times a day (TID) | ORAL | Status: DC
  Filled 2011-12-28 (×3): qty 1

## 2011-12-28 MED ORDER — ISOSORBIDE MONONITRATE ER 30 MG PO TB24
30.0000 mg | ORAL_TABLET | Freq: Every day | ORAL | Status: DC
  Administered 2011-12-28: 30 mg via ORAL
  Filled 2011-12-28: qty 1

## 2011-12-28 MED ORDER — ACETAMINOPHEN 325 MG PO TABS: 650.0000 mg | ORAL_TABLET | Freq: Four times a day (QID) | ORAL | Status: DC | PRN
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
## 2011-12-28 NOTE — Progress Notes (Signed)
.  Clinical social worker assisted with patient discharge to skilled nursing facility. .Patient transportation provided by [employer] [facility] with patient chart copy. .No further Clinical Social Work needs, signing off.   Catha Gosselin, Theresia Majors  [phone] .12/28/2011 1543pm

## 2011-12-28 NOTE — Plan of Care (Signed)
Problem: Discharge Progression Outcomes Goal: Complications resolved/controlled Outcome: Progressing Pt discharged to nursing home for rehab  Comments:  Pt and wife given discharge instructions with reinforcements given as needed.  Pt  Vital signs within normal limits and pt had no complaints of pain.  Pt escorted out of building via stretcher to ambulance where pt will be transported to assigned nursing home.

## 2011-12-28 NOTE — Progress Notes (Signed)
Physical Therapy Treatment Patient Details Name: Shane [name] MRN: [id number] DOB: 10-30-1916 Today's Date: 12/28/2011 Time: 2841-3244 PT Time Calculation (min): 24 min  PT Assessment / Plan / Recommendation Comments on Treatment Session  Patient with improving functional mobility. Will try patient with [place] Dec lift without leg rest attachment and attempt stepping.     Follow Up Recommendations  Skilled nursing facility    Barriers to Discharge  None      Equipment Recommendations  Defer to next venue    Recommendations for Other Services  None  Frequency Min 3X/week   Plan Discharge plan remains appropriate;Frequency remains appropriate    Precautions / Restrictions Precautions Precautions: Fall Restrictions Weight Bearing Restrictions: No   Pertinent Vitals/Pain VSS/no pain    Mobility  Bed Mobility Rolling Right: 3: Mod assist Right Sidelying to Sit: 4: Min assist Sitting - Scoot to Edge of Bed: 4: Min assist Details for Bed Mobility Assistance: Patient slow to process commands initially but improved throughout session. Tactile and verbal cues for rolling, then assistance to lower legs to edge of bed, but patient able to elevate trunk 50% of the way without physical assistance. Transfers Transfers: Sit to Stand;Stand to [employer] via Lift Equipment: Kandee Keen Details for [employer]: Stood patient with sara lift and worked on upright posture. Verbal cues for trunk extension but maintanis hip extension well. Then transferred to chair.      Exercises General Exercises - Lower Extremity Long Arc Quad: AROM;Both;10 reps;Seated (2 sets) Hip Flexion/Marching: AROM;Both;10 reps;Seated (2 sets) Toe Raises: AROM;Both;20 reps;Seated Heel Raises: AROM;Both;20 reps;Seated    PT Goals Acute Rehab PT Goals PT Goal: Sit to Stand - Progress: Progressing toward goal PT Goal: Stand to Sit - Progress: Progressing toward goal  Visit Information  Last PT Received On:  12/28/11 Assistance Needed: +2    Subjective Data  Subjective: Patient reports feeling better Patient Stated Goal: Increase strength of left leg   Cognition  Overall Cognitive Status: Impaired Area of Impairment: Problem solving;Safety/judgement Arousal/Alertness: Awake/alert Orientation Level: Appears intact for tasks assessed Behavior During Session: [facility] for tasks performed Current Attention Level: Sustained Following Commands: Follows multi-step commands inconsistently    Balance  Static Sitting Balance Static Sitting - Balance Support: Feet supported;Bilateral upper extremity supported Static Sitting - Level of Assistance: 5: Stand by assistance  End of Session PT - End of Session Equipment Utilized During Treatment: Gait belt Activity Tolerance: Patient tolerated treatment well Patient left: in chair;with call bell/phone within reach;with chair alarm set Nurse Communication: Mobility status    Edwyna Perfect, PT  Pager [phone]  12/28/2011, 11:26 AM

## 2011-12-29 LAB — CULTURE, BLOOD (ROUTINE X 2)
Culture  Setup Time: 201306120339
Culture  Setup Time: 201306120339
Culture: NO GROWTH
Culture: NO GROWTH

## 2012-01-12 ENCOUNTER — Ambulatory Visit: Payer: Medicare Other | Admitting: Pulmonary Disease

## 2012-02-03 ENCOUNTER — Telehealth: Payer: Self-pay | Admitting: Pulmonary Disease

## 2012-02-03 NOTE — Telephone Encounter (Signed)
Called spoke with patient, who did not know that an ankle brace had been ordered but wonders if he would benefit from one.  Pt wife stated that she has a brace at home that he could try.  Will call our office back if he decides to try an ankle brace and it will be ordered locally.  Called [facility] and requested the order be cancelled.  Spoke with Jill Alexanders, who ensured this will be done.

## 2012-02-03 NOTE — Telephone Encounter (Signed)
Please advise the status of the form, thanks!

## 2012-02-12 ENCOUNTER — Ambulatory Visit: Payer: No Typology Code available for payment source | Admitting: Cardiology

## 2012-02-18 ENCOUNTER — Encounter: Payer: Self-pay | Admitting: Physician Assistant

## 2012-02-18 ENCOUNTER — Ambulatory Visit (INDEPENDENT_AMBULATORY_CARE_PROVIDER_SITE_OTHER): Payer: Medicare Other | Admitting: Physician Assistant

## 2012-02-18 ENCOUNTER — Ambulatory Visit: Payer: PRIVATE HEALTH INSURANCE | Admitting: Cardiovascular Disease

## 2012-02-18 VITALS — BP 147/62 | HR 64 | Ht 66.0 in | Wt 145.4 lb

## 2012-02-18 DIAGNOSIS — R55 Syncope and collapse: Secondary | ICD-10-CM

## 2012-02-18 DIAGNOSIS — I359 Nonrheumatic aortic valve disorder, unspecified: Secondary | ICD-10-CM

## 2012-02-18 DIAGNOSIS — I350 Nonrheumatic aortic (valve) stenosis: Secondary | ICD-10-CM

## 2012-02-18 DIAGNOSIS — I951 Orthostatic hypotension: Secondary | ICD-10-CM

## 2012-02-18 DIAGNOSIS — I10 Essential (primary) hypertension: Secondary | ICD-10-CM
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
## 2012-02-18 NOTE — Patient Instructions (Addendum)
DECREASE ASPIRIN TO 81 MG DAILY  Your physician has requested that you have a lexiscan myoview DX SYNCOPE. For further information please visit [email or web address] Please follow instruction sheet, as given.  Your physician has recommended that you wear an event monitor DX SYNCOPE. Event monitors are medical devices that record the heart's electrical activity. Doctors most often [place] these monitors to diagnose arrhythmias. Arrhythmias are problems with the speed or rhythm of the heartbeat. The monitor is a small, portable device. You can wear one while you do your normal daily activities. This is usually used to diagnose what is causing palpitations/syncope (passing out).  YOU HAVE BEEN GIVEN A PRESCRIPTION FOR KNEE HIGH COMPRESSION STOCKINGS  Your physician recommends that you schedule a follow-up appointment in: APPROX 4-6 WEEKS WITH DR. ALLRED

## 2012-02-19 ENCOUNTER — Telehealth: Payer: Self-pay | Admitting: Physician Assistant

## 2012-02-19 NOTE — Telephone Encounter (Signed)
New msg Octavio Graves wants to know where she can get compression stockings for patient

## 2012-02-22 ENCOUNTER — Other Ambulatory Visit: Payer: Self-pay | Admitting: *Deleted

## 2012-02-22 ENCOUNTER — Telehealth: Payer: Self-pay | Admitting: *Deleted

## 2012-02-22 ENCOUNTER — Telehealth: Payer: Self-pay | Admitting: Pulmonary Disease

## 2012-02-22 MED ORDER — TAMSULOSIN HCL 0.4 MG PO CAPS: 0.4000 mg | ORAL_CAPSULE | Freq: Every day | ORAL | Status: DC

## 2012-02-22 MED ORDER — HYDRALAZINE HCL 50 MG PO TABS: 50.0000 mg | ORAL_TABLET | Freq: Three times a day (TID) | ORAL | Status: DC

## 2012-02-22 MED ORDER — GLIPIZIDE 5 MG PO TABS: 5.0000 mg | ORAL_TABLET | Freq: Every day | ORAL | Status: DC

## 2012-02-22 NOTE — Telephone Encounter (Signed)
Called and spoke with Shane [name] and she is aware that the flomax and glipizide have been sent in for refills and appt has been schduled for the pt to see SN on 8/29 at 2pm.  Shane [name] is aware to bring in all of pts med bottles and d/c paper from the nursing home.  Shane [name] voiced her understanding of this and nothing further is needed.

## 2012-02-22 NOTE — Telephone Encounter (Signed)
Shane [name] is very upset that she has not gotten a returned call.  Stated she has been trying to get this medication filled since 02/18/12, pt is completely out & needs this done asap.  [name]

## 2012-02-22 NOTE — Telephone Encounter (Signed)
Pt last seen by SN 4.2.13.  Glipizide was not on med list when last seen by SN, in note says to continue his metformin 500mg  bid.  Called spoke with Bourg who stated that she is not at home and therfore cannot see what the names of the medications are that pt is needing.  Requested that I call the pharmacy and call her "right back."  [facility] spoke with pharmacy tech (pt's great niece?) who reported that pt needs a refill on glipizide xr 5mg  qd.  This was written by Candelaria Celeste NP at [facility].  Pt is needed refills but no longer a resident at that facility.  Also requested refills on flomax, but advised that pt was given 11 refills on this at last ov.   Called [facility] again, advised will need to get SN's approval for pt to continue the glipizide as pt was not taking this at last ov.  Per Jennings, pt is confused and does not know what medications he is taking.  She stated that she checks on him every day (proclaimed to be his caregiver, lives across the street) and prepares his weekly pill organizer for him but does not know if he is taking them correctly.  Reports pt also has a nurse come to the house twice a week but doesn't appear to be of much assistance when it comes to his meds.  Is requesting to know if pt needs to come back for ov for med review.  Asked Octavio Graves if she is available to actually administer pt's meds when they are due, but an answer was not provided.  Dr Kriste Basque please advise, thanks.

## 2012-02-22 NOTE — Telephone Encounter (Signed)
Talked with Mrs. Benham about patient wanting to know where to get compression stockings but they already picked them up Saturday.

## 2012-02-22 NOTE — Telephone Encounter (Signed)
Error.  Duplicate message.  Holly D Pryor °- °

## 2012-02-23 ENCOUNTER — Other Ambulatory Visit (INDEPENDENT_AMBULATORY_CARE_PROVIDER_SITE_OTHER): Payer: Medicare Other

## 2012-02-23 ENCOUNTER — Ambulatory Visit (INDEPENDENT_AMBULATORY_CARE_PROVIDER_SITE_OTHER): Payer: Medicare Other | Admitting: Pulmonary Disease

## 2012-02-23 ENCOUNTER — Encounter: Payer: Self-pay | Admitting: Pulmonary Disease

## 2012-02-23 VITALS — BP 128/64 | HR 56 | Temp 97.3°F | Ht 67.0 in | Wt 146.6 lb

## 2012-02-23 DIAGNOSIS — I10 Essential (primary) hypertension: Secondary | ICD-10-CM

## 2012-02-23 DIAGNOSIS — D126 Benign neoplasm of colon, unspecified: Secondary | ICD-10-CM

## 2012-02-23 DIAGNOSIS — E119 Type 2 diabetes mellitus without complications: Secondary | ICD-10-CM

## 2012-02-23 DIAGNOSIS — I872 Venous insufficiency (chronic) (peripheral): Secondary | ICD-10-CM

## 2012-02-23 DIAGNOSIS — I359 Nonrheumatic aortic valve disorder, unspecified: Secondary | ICD-10-CM

## 2012-02-23 DIAGNOSIS — K5730 Diverticulosis of large intestine without perforation or abscess without bleeding: Secondary | ICD-10-CM

## 2012-02-23 DIAGNOSIS — E538 Deficiency of other specified B group vitamins: Secondary | ICD-10-CM

## 2012-02-23 DIAGNOSIS — I951 Orthostatic hypotension: Secondary | ICD-10-CM

## 2012-02-23 DIAGNOSIS — D649 Anemia, unspecified: Secondary | ICD-10-CM

## 2012-02-23 DIAGNOSIS — I739 Peripheral vascular disease, unspecified: Secondary | ICD-10-CM

## 2012-02-23 DIAGNOSIS — R55 Syncope and collapse: Secondary | ICD-10-CM

## 2012-02-23 DIAGNOSIS — C61 Malignant neoplasm of prostate: Secondary | ICD-10-CM

## 2012-02-23 DIAGNOSIS — N259 Disorder resulting from impaired renal tubular function, unspecified: Secondary | ICD-10-CM

## 2012-02-23 DIAGNOSIS — R1310 Dysphagia, unspecified: Secondary | ICD-10-CM

## 2012-02-23 DIAGNOSIS — G609 Hereditary and idiopathic neuropathy, unspecified: Secondary | ICD-10-CM

## 2012-02-23 LAB — CBC WITH DIFFERENTIAL/PLATELET
Basophils Relative: 0.4 % (ref 0.0–3.0)
Eosinophils Relative: 2.1 % (ref 0.0–5.0)
HCT: 32.3 % — ABNORMAL LOW (ref 39.0–52.0)
Hemoglobin: 10.3 g/dL — ABNORMAL LOW (ref 13.0–17.0)
Lymphs Abs: 1.7 10*3/uL (ref 0.7–4.0)
MCV: 85.6 fl (ref 78.0–100.0)
Monocytes Absolute: 0.8 10*3/uL (ref 0.1–1.0)
Neutro Abs: 3.6 10*3/uL (ref 1.4–7.7)
RBC: 3.77 Mil/uL — ABNORMAL LOW (ref 4.22–5.81)
WBC: 6.2 10*3/uL (ref 4.5–10.5)

## 2012-02-23 MED ORDER — GLIPIZIDE 5 MG PO TABS: 5.0000 mg | ORAL_TABLET | Freq: Every day | ORAL | Status: DC

## 2012-02-23 MED ORDER — TAMSULOSIN HCL 0.4 MG PO CAPS: 0.4000 mg | ORAL_CAPSULE | Freq: Every day | ORAL | Status: DC

## 2012-02-23 MED ORDER — ISOSORBIDE MONONITRATE ER 30 MG PO TB24: 30.0000 mg | ORAL_TABLET | Freq: Every day | ORAL | Status: DC

## 2012-02-23 MED ORDER — HYDRALAZINE HCL 50 MG PO TABS: 50.0000 mg | ORAL_TABLET | Freq: Three times a day (TID) | ORAL | Status: DC

## 2012-02-23 MED ORDER — NIFEDIPINE ER OSMOTIC RELEASE 30 MG PO TB24: 30.0000 mg | ORAL_TABLET | Freq: Two times a day (BID) | ORAL | Status: DC
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
## 2012-02-23 NOTE — Patient Instructions (Addendum)
Today we updated your med list in our EPIC system...    We spent some time today correlationg the prior med lists w/ your Hospital list & Nursing home list...    The list below shows all your current meds & how you should be taking them...    Your family & helpers will need to review this list & be sure your meds are right...  Call us for any questions...  Today we did some follow up blood work...  Let's plan to recheck you here in one month.Marland KitchenMarland Kitchen

## 2012-02-24 LAB — BASIC METABOLIC PANEL
BUN: 30 mg/dL — ABNORMAL HIGH (ref 6–23)
Chloride: 110 mEq/L (ref 96–112)
Potassium: 5.1 mEq/L (ref 3.5–5.1)

## 2012-03-01 ENCOUNTER — Ambulatory Visit (HOSPITAL_COMMUNITY): Payer: Medicare Other | Attending: Internal Medicine | Admitting: Radiology

## 2012-03-01 VITALS — BP 179/66 | Ht 66.0 in | Wt 145.0 lb

## 2012-03-01 DIAGNOSIS — I10 Essential (primary) hypertension: Secondary | ICD-10-CM | POA: Insufficient documentation

## 2012-03-01 DIAGNOSIS — R0609 Other forms of dyspnea: Secondary | ICD-10-CM | POA: Insufficient documentation

## 2012-03-01 DIAGNOSIS — Z87891 Personal history of nicotine dependence: Secondary | ICD-10-CM | POA: Insufficient documentation

## 2012-03-01 DIAGNOSIS — E119 Type 2 diabetes mellitus without complications: Secondary | ICD-10-CM | POA: Insufficient documentation

## 2012-03-01 DIAGNOSIS — R55 Syncope and collapse: Secondary | ICD-10-CM | POA: Insufficient documentation

## 2012-03-01 DIAGNOSIS — I739 Peripheral vascular disease, unspecified: Secondary | ICD-10-CM | POA: Insufficient documentation

## 2012-03-01 DIAGNOSIS — R0989 Other specified symptoms and signs involving the circulatory and respiratory systems: Secondary | ICD-10-CM

## 2012-03-01 MED ORDER — REGADENOSON 0.4 MG/5ML IV SOLN
0.4000 mg | Freq: Once | INTRAVENOUS | Status: AC
  Administered 2012-03-01: 0.4 mg via INTRAVENOUS

## 2012-03-01 MED ORDER — TECHNETIUM TC 99M TETROFOSMIN IV KIT
30.0000 | PACK | Freq: Once | INTRAVENOUS | Status: AC | PRN
  Administered 2012-03-01: 30 via INTRAVENOUS

## 2012-03-01 MED ORDER — TECHNETIUM TC 99M TETROFOSMIN IV KIT
10.0000 | PACK | Freq: Once | INTRAVENOUS | Status: AC | PRN
  Administered 2012-03-01: 10 via INTRAVENOUS
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
## 2012-03-09 ENCOUNTER — Encounter (INDEPENDENT_AMBULATORY_CARE_PROVIDER_SITE_OTHER): Payer: Medicare Other

## 2012-03-09 DIAGNOSIS — R55 Syncope and collapse: Secondary | ICD-10-CM

## 2012-03-10 ENCOUNTER — Ambulatory Visit: Payer: Medicare Other | Admitting: Pulmonary Disease

## 2012-03-23 ENCOUNTER — Encounter: Payer: Self-pay | Admitting: Pulmonary Disease

## 2012-03-23 ENCOUNTER — Ambulatory Visit (INDEPENDENT_AMBULATORY_CARE_PROVIDER_SITE_OTHER): Payer: Medicare Other | Admitting: Pulmonary Disease

## 2012-03-23 VITALS — BP 150/68 | HR 63 | Temp 98.0°F | Ht 66.0 in | Wt 148.8 lb

## 2012-03-23 DIAGNOSIS — E119 Type 2 diabetes mellitus without complications: Secondary | ICD-10-CM

## 2012-03-23 DIAGNOSIS — I10 Essential (primary) hypertension: Secondary | ICD-10-CM

## 2012-03-23 DIAGNOSIS — D126 Benign neoplasm of colon, unspecified: Secondary | ICD-10-CM

## 2012-03-23 DIAGNOSIS — I739 Peripheral vascular disease, unspecified: Secondary | ICD-10-CM

## 2012-03-23 DIAGNOSIS — M545 Low back pain, unspecified: Secondary | ICD-10-CM

## 2012-03-23 DIAGNOSIS — D649 Anemia, unspecified: Secondary | ICD-10-CM

## 2012-03-23 DIAGNOSIS — N259 Disorder resulting from impaired renal tubular function, unspecified: Secondary | ICD-10-CM

## 2012-03-23 DIAGNOSIS — K5730 Diverticulosis of large intestine without perforation or abscess without bleeding: Secondary | ICD-10-CM

## 2012-03-23 DIAGNOSIS — I359 Nonrheumatic aortic valve disorder, unspecified: Secondary | ICD-10-CM

## 2012-03-23 DIAGNOSIS — Z23 Encounter for immunization: Secondary | ICD-10-CM

## 2012-03-23 DIAGNOSIS — G609 Hereditary and idiopathic neuropathy, unspecified: Secondary | ICD-10-CM

## 2012-03-23 DIAGNOSIS — C61 Malignant neoplasm of prostate: Secondary | ICD-10-CM
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
## 2012-03-23 NOTE — Patient Instructions (Addendum)
Today we updated your med list in our EPIC system...    Continue your current medications the same...  Keep up the good work & your home exercise program...  Call for any problems...  Let's plan a follow up visit in 2 months.Marland KitchenMarland Kitchen

## 2012-03-29 ENCOUNTER — Ambulatory Visit: Payer: Medicare Other | Admitting: Pulmonary Disease

## 2012-04-04 ENCOUNTER — Ambulatory Visit (INDEPENDENT_AMBULATORY_CARE_PROVIDER_SITE_OTHER): Payer: Medicare Other | Admitting: Internal Medicine

## 2012-04-04 ENCOUNTER — Encounter: Payer: Self-pay | Admitting: Internal Medicine

## 2012-04-04 VITALS — BP 166/58 | HR 54 | Ht 66.0 in | Wt 149.0 lb

## 2012-04-04 DIAGNOSIS — R001 Bradycardia, unspecified: Secondary | ICD-10-CM

## 2012-04-04 DIAGNOSIS — R55 Syncope and collapse: Secondary | ICD-10-CM

## 2012-04-04 DIAGNOSIS — I10 Essential (primary) hypertension: Secondary | ICD-10-CM

## 2012-04-04 DIAGNOSIS — I951 Orthostatic hypotension: Secondary | ICD-10-CM

## 2012-04-04 DIAGNOSIS — I498 Other specified cardiac arrhythmias: Secondary | ICD-10-CM

## 2012-04-04 NOTE — Patient Instructions (Addendum)
Your physician wants you to follow-up in: 4 months with [name], [facility] and 12 months with Dr [name] will receive a reminder letter in the mail two months in advance. If you don't receive a letter, please call our office to schedule the follow-up appointment.

## 2012-04-04 NOTE — Assessment & Plan Note (Signed)
Asymptomatic Event monitor is reviewed and reveals no prolonged pauses or extreme bradycardia Avoid AV nodal agents No indication for PPM at this point.

## 2012-04-04 NOTE — Assessment & Plan Note (Signed)
There is a difficult balance between elevated BP and low BP I would not advised aggressive BP management, however, it would be reasonably to target SBP less than 150.  He has not been taking imdur.  Given absence of ischemic changes on myoview and preserved EF, I am not certain that Imdur would be helpful at this point and may contribute to postural dizziness.  Avoid beta blockers due to prior bradycardia. Consider increasing hydralazine if BP remains elevated.  Follow-up with [name] in 4 months Return to see me in 1 year

## 2012-04-04 NOTE — Assessment & Plan Note (Signed)
As above.

## 2012-04-04 NOTE — Assessment & Plan Note (Signed)
No further episodes  Event monitor/ myoview/ echo reviewed and are all low risk No changes at this time  Careful to avoid postural hypotension

## 2012-04-04 NOTE — Progress Notes (Signed)
PCP: Michele Mcalpine, MD  Shane [name] is a 78 y.o. male who presents today for routine cardiology followup.   Since his hospitalization, the patient reports doing very well. He has had no further syncope.  He has occasional postural dizziness.   Today, he denies symptoms of palpitations, chest pain, shortness of breath,  lower extremity edema,  presyncope, or syncope.  The patient is otherwise without complaint today.   Past Medical History  Diagnosis Date  . Dental caries   . Hypertension   . Aortic stenosis     Echocardiogram 12/20/11: Mild LVH, EF 55-60%, grade 1 diastolic dysfunction, mild aortic stenosis, mean gradient 13, AVA 1.39 cm, mild MAC and nodular sclerosis of the tip of the anterior leaflet, mild MR, mild to moderate LAE, mild TR.  [name] Peripheral vascular disease   . Venous insufficiency   . Diabetes mellitus   . Diverticulosis of colon   . History of colonic polyps   . Renal insufficiency   . Adenocarcinoma of prostate   . Chronic low back pain   . History of syncope   . Peripheral neuropathy   . Sebaceous cyst   . Anemia   . Hyperkalemia 12/19/2011  . Syncope    No past surgical history on file.  Current Outpatient Prescriptions  Medication Sig Dispense Refill  . aspirin 325 MG tablet Take 325 mg by mouth daily.      . Ferrous Sulfate Dried (FEOSOL) 200 (65 FE) MG TABS Take 1 tablet by mouth daily. Take with vitamin c 500mg  daily       . glipiZIDE (GLUCOTROL) 5 MG tablet Take 1 tablet (5 mg total) by mouth daily.  30 tablet  6  . hydrALAZINE (APRESOLINE) 50 MG tablet Take 1 tablet (50 mg total) by mouth 3 (three) times daily.  90 tablet  6  . isosorbide mononitrate (IMDUR) 30 MG 24 hr tablet Take 1 tablet (30 mg total) by mouth daily.  30 tablet  6  . NIFEdipine (PROCARDIA-XL/ADALAT-CC/NIFEDICAL-XL) 30 MG 24 hr tablet Take 1 tablet (30 mg total) by mouth 2 (two) times daily.  30 tablet  6  . senna-docusate (SENOKOT-S) 8.6-50 MG per tablet Take 1 tablet by mouth daily.         . Tamsulosin HCl (FLOMAX) 0.4 MG CAPS Take 1 capsule (0.4 mg total) by mouth at bedtime.  30 capsule  6  . vitamin B-12 (CYANOCOBALAMIN) 500 MCG tablet Take 500 mcg by mouth daily.        . vitamin C (ASCORBIC ACID) 500 MG tablet Take 500 mg by mouth daily.        Physical Exam: Filed Vitals:   04/04/12 1048  BP: 166/58  Pulse: 54  Height: 5\' 6"  (1.676 m)  Weight: 149 lb (67.586 kg)  SpO2: 98%    GEN- The patient is elderly appearing, alert and oriented x 3 today.   Head- normocephalic, atraumatic Eyes-  Sclera clear, conjunctiva pink Ears- hearing intact Oropharynx- clear Lungs- Clear to ausculation bilaterally, normal work of breathing Heart- Regular rate and rhythm, 2/6 SEM LUSB (early peaking) GI- soft, NT, ND, + BS Extremities- no clubbing, cyanosis, or edema  Echo/ stress test/ event monitor reviewed and discussed at length with the patient today  Assessment and Plan:

## 2012-04-05 ENCOUNTER — Telehealth: Payer: Self-pay | Admitting: Internal Medicine

## 2012-04-05 NOTE — Telephone Encounter (Signed)
lmom to return call

## 2012-04-05 NOTE — Telephone Encounter (Signed)
Follow-up: ° ° ° ° °Returned your call.  Please call back. °

## 2012-04-05 NOTE — Telephone Encounter (Signed)
lmom to return my call

## 2012-04-05 NOTE — Telephone Encounter (Signed)
Plz return call to [facility] [phone] regarding patient medication.

## 2012-04-05 NOTE — Telephone Encounter (Signed)
Number busy

## 2012-04-06 NOTE — Telephone Encounter (Signed)
Spoke to patient's daughter Octavio Graves, she needs to speak to [place] about patient's medication.Kelly not in office today. Will send message to her and she will call back tomorrow 04/07/12.

## 2012-04-06 NOTE — Telephone Encounter (Signed)
New Probelm:    Patient's daughter called in returning [facility] call. Please call back.

## 2012-04-07 NOTE — Telephone Encounter (Signed)
lmom for daughter to call me back 

## 2012-04-08 NOTE — Telephone Encounter (Signed)
Spoke with daughter she is going to come by and pick up the dictation from the office visit to go over his medication list

## 2012-05-24 ENCOUNTER — Ambulatory Visit: Payer: Medicare Other | Admitting: Pulmonary Disease

## 2012-05-27 ENCOUNTER — Encounter: Payer: Self-pay | Admitting: *Deleted

## 2012-05-30 ENCOUNTER — Other Ambulatory Visit (INDEPENDENT_AMBULATORY_CARE_PROVIDER_SITE_OTHER): Payer: Medicare Other

## 2012-05-30 ENCOUNTER — Ambulatory Visit (INDEPENDENT_AMBULATORY_CARE_PROVIDER_SITE_OTHER): Payer: Medicare Other | Admitting: Pulmonary Disease

## 2012-05-30 ENCOUNTER — Encounter: Payer: Self-pay | Admitting: Pulmonary Disease

## 2012-05-30 VITALS — BP 122/58 | HR 54 | Temp 97.7°F | Ht 66.0 in | Wt 156.4 lb

## 2012-05-30 DIAGNOSIS — D649 Anemia, unspecified: Secondary | ICD-10-CM

## 2012-05-30 DIAGNOSIS — I10 Essential (primary) hypertension: Secondary | ICD-10-CM

## 2012-05-30 DIAGNOSIS — N259 Disorder resulting from impaired renal tubular function, unspecified: Secondary | ICD-10-CM

## 2012-05-30 DIAGNOSIS — M545 Low back pain: Secondary | ICD-10-CM

## 2012-05-30 DIAGNOSIS — E119 Type 2 diabetes mellitus without complications: Secondary | ICD-10-CM

## 2012-05-30 DIAGNOSIS — I359 Nonrheumatic aortic valve disorder, unspecified: Secondary | ICD-10-CM

## 2012-05-30 DIAGNOSIS — I739 Peripheral vascular disease, unspecified: Secondary | ICD-10-CM

## 2012-05-30 DIAGNOSIS — C61 Malignant neoplasm of prostate: Secondary | ICD-10-CM

## 2012-05-30 LAB — CBC WITH DIFFERENTIAL/PLATELET
Basophils Absolute: 0 10*3/uL (ref 0.0–0.1)
Eosinophils Absolute: 0.1 10*3/uL (ref 0.0–0.7)
Hemoglobin: 10.1 g/dL — ABNORMAL LOW (ref 13.0–17.0)
Lymphocytes Relative: 24.7 % (ref 12.0–46.0)
Monocytes Relative: 13 % — ABNORMAL HIGH (ref 3.0–12.0)
Neutro Abs: 3.2 10*3/uL (ref 1.4–7.7)
Neutrophils Relative %: 60 % (ref 43.0–77.0)
RBC: 3.72 Mil/uL — ABNORMAL LOW (ref 4.22–5.81)
RDW: 15.8 % — ABNORMAL HIGH (ref 11.5–14.6)

## 2012-05-30 LAB — BASIC METABOLIC PANEL
Calcium: 8.6 mg/dL (ref 8.4–10.5)
Creatinine, Ser: 2.2 mg/dL — ABNORMAL HIGH (ref 0.4–1.5)
GFR: 35.86 mL/min — ABNORMAL LOW (ref >60.00)
Glucose, Bld: 72 mg/dL (ref 70–99)
Sodium: 140 mEq/L (ref 135–145)
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
## 2012-05-30 NOTE — Patient Instructions (Addendum)
Today we updated your med list in our EPIC system...    Continue your current medications the same...  Today we did your follow up blood work...    We will call you w/ the results when avail...  BE SAFE, no falling allowed...  Let's plan a follow up visit in 3 months, sooner if needed for problems.Marland KitchenMarland Kitchen

## 2012-06-01 NOTE — Progress Notes (Signed)
Quick Note:  ATC, no answer. LMOMTCB ______

## 2012-06-02 ENCOUNTER — Telehealth: Payer: Self-pay | Admitting: Pulmonary Disease

## 2012-06-02 NOTE — Telephone Encounter (Signed)
Called and lmomtcb for bonita---pts caregiver.  Per SN:  Please notify patient> Chems- ok, stable w/ mild renal insuffic Cr=2.2 unchanged, continue same meds... CBC- mild anemia, no change, continue same meds...   We are going to decrease the glipizide 5 mg  To 1/2 tablet daily.  The med list has been updated today.

## 2012-06-03 NOTE — Telephone Encounter (Signed)
I spoke with Shane [name] and advised of results and recs.Carron Curie, CMA

## 2012-08-02 ENCOUNTER — Other Ambulatory Visit: Payer: Self-pay | Admitting: Pulmonary Disease

## 2012-08-02 MED ORDER — NIFEDIPINE ER OSMOTIC RELEASE 30 MG PO TB24: 30.0000 mg | ORAL_TABLET | Freq: Two times a day (BID) | ORAL | Status: DC

## 2012-08-02 NOTE — Telephone Encounter (Signed)
Pharmacy requesting rx for procardia  Pt has appt 09-27-12.rx sent

## 2012-09-27 ENCOUNTER — Ambulatory Visit (INDEPENDENT_AMBULATORY_CARE_PROVIDER_SITE_OTHER): Payer: Medicare Other | Admitting: Pulmonary Disease

## 2012-09-27 ENCOUNTER — Encounter: Payer: Self-pay | Admitting: Pulmonary Disease

## 2012-09-27 ENCOUNTER — Other Ambulatory Visit (INDEPENDENT_AMBULATORY_CARE_PROVIDER_SITE_OTHER): Payer: Medicare Other

## 2012-09-27 VITALS — BP 152/70 | HR 62 | Temp 96.5°F | Ht 66.0 in | Wt 159.2 lb

## 2012-09-27 DIAGNOSIS — I359 Nonrheumatic aortic valve disorder, unspecified: Secondary | ICD-10-CM

## 2012-09-27 DIAGNOSIS — D649 Anemia, unspecified: Secondary | ICD-10-CM

## 2012-09-27 LAB — CBC WITH DIFFERENTIAL/PLATELET
Eosinophils Relative: 2.3 % (ref 0.0–5.0)
HCT: 31.5 % — ABNORMAL LOW (ref 39.0–52.0)
Hemoglobin: 10.3 g/dL — ABNORMAL LOW (ref 13.0–17.0)
Lymphocytes Relative: 17.6 % (ref 12.0–46.0)
Lymphs Abs: 0.8 10*3/uL (ref 0.7–4.0)
Monocytes Relative: 11.7 % (ref 3.0–12.0)
Platelets: 233 10*3/uL (ref 150.0–400.0)
WBC: 4.8 10*3/uL (ref 4.5–10.5)

## 2012-09-27 LAB — BASIC METABOLIC PANEL
BUN: 35 mg/dL — ABNORMAL HIGH (ref 6–23)
Calcium: 8.9 mg/dL (ref 8.4–10.5)
GFR: 30.65 mL/min — ABNORMAL LOW (ref >60.00)
Potassium: 5.9 mEq/L — ABNORMAL HIGH (ref 3.5–5.1)
Sodium: 138 mEq/L (ref 135–145)

## 2012-09-27 LAB — IBC PANEL
Iron: 55 ug/dL (ref 42–165)
Saturation Ratios: 20.5 % (ref 20.0–50.0)

## 2012-09-27 LAB — PSA: PSA: 4.44 ng/mL — ABNORMAL HIGH (ref 0.10–4.00)

## 2012-09-27 LAB — HEMOGLOBIN A1C: Hgb A1c MFr Bld: 6.9 % — ABNORMAL HIGH (ref 4.6–6.5)

## 2012-09-27 MED ORDER — TAMSULOSIN HCL 0.4 MG PO CAPS: 0.4000 mg | ORAL_CAPSULE | Freq: Every day | ORAL | Status: DC

## 2012-09-27 MED ORDER — HYDRALAZINE HCL 50 MG PO TABS: 50.0000 mg | ORAL_TABLET | Freq: Three times a day (TID) | ORAL | Status: DC

## 2012-09-27 MED ORDER — NIFEDIPINE ER OSMOTIC RELEASE 30 MG PO TB24: 30.0000 mg | ORAL_TABLET | Freq: Two times a day (BID) | ORAL | Status: DC

## 2012-09-27 MED ORDER — ISOSORBIDE MONONITRATE ER 30 MG PO TB24: 30.0000 mg | ORAL_TABLET | Freq: Every day | ORAL | Status: DC
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
## 2012-09-27 NOTE — Patient Instructions (Addendum)
Today we updated your med list in our EPIC system...    Continue your current medications the same...    We refilled your meds as we discussed...  Today we did your follow up blood work...    We will contact you w/ the results when available...   Be sure to stay as active as possible & increase your exercise program...  Call for any questions...  Let's plan a similar follow up visit in 4 months.Marland KitchenMarland Kitchen

## 2012-09-28 ENCOUNTER — Other Ambulatory Visit: Payer: Self-pay | Admitting: Pulmonary Disease

## 2012-10-06 LAB — HM DIABETES EYE EXAM

## 2012-12-20 ENCOUNTER — Telehealth: Payer: Self-pay | Admitting: Pulmonary Disease

## 2012-12-20 ENCOUNTER — Other Ambulatory Visit: Payer: Self-pay | Admitting: Pulmonary Disease

## 2012-12-20 NOTE — Telephone Encounter (Signed)
lmtcb x1 

## 2012-12-21 NOTE — Telephone Encounter (Signed)
Per pt's med list, hydralazine 50 mg # 90 x 6 and nifedipine 30 mg # 60 x 11 rxs were sent to [facility] on 09/27/12.  [employer], spoke with [place].  Was advised they did receive the rxs.  The hydralazine is ready for pick.  They will get nifedipine rx ready for pick up as well.  Called, spoke with pt's daughter.  Informed her of above.  She verbalized understanding, voiced no further questions or concerns at this time, and is to call back if anything further is needed.

## 2013-01-16 ENCOUNTER — Telehealth: Payer: Self-pay | Admitting: Pulmonary Disease
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
## 2013-01-18 ENCOUNTER — Other Ambulatory Visit (INDEPENDENT_AMBULATORY_CARE_PROVIDER_SITE_OTHER): Payer: Medicare Other

## 2013-01-18 ENCOUNTER — Ambulatory Visit (INDEPENDENT_AMBULATORY_CARE_PROVIDER_SITE_OTHER): Payer: Medicare Other | Admitting: Pulmonary Disease

## 2013-01-18 ENCOUNTER — Encounter: Payer: Self-pay | Admitting: Pulmonary Disease

## 2013-01-18 VITALS — BP 144/62 | HR 60 | Temp 98.8°F | Ht 66.0 in | Wt 160.8 lb

## 2013-01-18 DIAGNOSIS — I10 Essential (primary) hypertension: Secondary | ICD-10-CM

## 2013-01-18 DIAGNOSIS — E119 Type 2 diabetes mellitus without complications: Secondary | ICD-10-CM

## 2013-01-18 DIAGNOSIS — I739 Peripheral vascular disease, unspecified: Secondary | ICD-10-CM

## 2013-01-18 DIAGNOSIS — N138 Other obstructive and reflux uropathy: Secondary | ICD-10-CM | POA: Insufficient documentation

## 2013-01-18 DIAGNOSIS — D649 Anemia, unspecified: Secondary | ICD-10-CM

## 2013-01-18 DIAGNOSIS — I359 Nonrheumatic aortic valve disorder, unspecified: Secondary | ICD-10-CM

## 2013-01-18 DIAGNOSIS — M1990 Unspecified osteoarthritis, unspecified site: Secondary | ICD-10-CM | POA: Insufficient documentation

## 2013-01-18 DIAGNOSIS — N259 Disorder resulting from impaired renal tubular function, unspecified: Secondary | ICD-10-CM

## 2013-01-18 DIAGNOSIS — G609 Hereditary and idiopathic neuropathy, unspecified: Secondary | ICD-10-CM

## 2013-01-18 DIAGNOSIS — N401 Enlarged prostate with lower urinary tract symptoms: Secondary | ICD-10-CM

## 2013-01-18 DIAGNOSIS — C61 Malignant neoplasm of prostate: Secondary | ICD-10-CM

## 2013-01-18 DIAGNOSIS — M545 Low back pain: Secondary | ICD-10-CM

## 2013-01-18 LAB — CBC WITH DIFFERENTIAL/PLATELET
Basophils Relative: 0.2 % (ref 0.0–3.0)
Eosinophils Relative: 0.8 % (ref 0.0–5.0)
HCT: 31.1 % — ABNORMAL LOW (ref 39.0–52.0)
Hemoglobin: 10.4 g/dL — ABNORMAL LOW (ref 13.0–17.0)
Lymphs Abs: 1 10*3/uL (ref 0.7–4.0)
MCV: 84.8 fl (ref 78.0–100.0)
Monocytes Absolute: 0.9 10*3/uL (ref 0.1–1.0)
Monocytes Relative: 12.3 % — ABNORMAL HIGH (ref 3.0–12.0)
Neutro Abs: 5 10*3/uL (ref 1.4–7.7)
Platelets: 216 10*3/uL (ref 150.0–400.0)
RBC: 3.67 Mil/uL — ABNORMAL LOW (ref 4.22–5.81)
WBC: 7 10*3/uL (ref 4.5–10.5)

## 2013-01-18 LAB — BASIC METABOLIC PANEL
BUN: 28 mg/dL — ABNORMAL HIGH (ref 6–23)
Chloride: 111 mEq/L (ref 96–112)
Potassium: 5.2 mEq/L — ABNORMAL HIGH (ref 3.5–5.1)
Sodium: 144 mEq/L (ref 135–145)

## 2013-01-18 LAB — HEMOGLOBIN A1C: Hgb A1c MFr Bld: 7.3 % — ABNORMAL HIGH (ref 4.6–6.5)

## 2013-01-18 MED ORDER — NIFEDIPINE ER OSMOTIC RELEASE 30 MG PO TB24: ORAL_TABLET | ORAL | Status: DC

## 2013-01-18 MED ORDER — HYDRALAZINE HCL 50 MG PO TABS: ORAL_TABLET | ORAL | Status: DC

## 2013-01-18 MED ORDER — PREDNISONE 10 MG PO TABS: 10.0000 mg | ORAL_TABLET | Freq: Every day | ORAL | Status: DC
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
## 2013-01-18 NOTE — Patient Instructions (Addendum)
Today we updated your med list in our EPIC system...    Continue your current medications the same...  We decided to add PREDNISONE 10mg  one tab each AM for your arthritis & stiffness to see if it helps...  You may also use Crista Elliot, Icey Hot, Myoflex cream, Salonpas, etc for muscle spasm to rub in...  Today we did your follow up blood work...    We will contact you w/ the results when available...   Call for any questions...  Let's plan a follow up visit in 55mo to check your response.Marland KitchenMarland Kitchen

## 2013-01-19 ENCOUNTER — Other Ambulatory Visit: Payer: Self-pay | Admitting: Pulmonary Disease

## 2013-01-19 DIAGNOSIS — D649 Anemia, unspecified: Secondary | ICD-10-CM

## 2013-01-19 DIAGNOSIS — E611 Iron deficiency: Secondary | ICD-10-CM

## 2013-01-26 ENCOUNTER — Encounter: Payer: Self-pay | Admitting: Pulmonary Disease

## 2013-01-26 ENCOUNTER — Ambulatory Visit (INDEPENDENT_AMBULATORY_CARE_PROVIDER_SITE_OTHER): Payer: Medicare Other | Admitting: Pulmonary Disease

## 2013-01-26 VITALS — BP 144/70 | HR 56 | Temp 99.0°F | Ht 66.0 in | Wt 162.8 lb

## 2013-01-26 DIAGNOSIS — I359 Nonrheumatic aortic valve disorder, unspecified: Secondary | ICD-10-CM

## 2013-01-26 DIAGNOSIS — N401 Enlarged prostate with lower urinary tract symptoms: Secondary | ICD-10-CM

## 2013-01-26 DIAGNOSIS — N139 Obstructive and reflux uropathy, unspecified: Secondary | ICD-10-CM

## 2013-01-26 DIAGNOSIS — D649 Anemia, unspecified: Secondary | ICD-10-CM

## 2013-01-26 DIAGNOSIS — I739 Peripheral vascular disease, unspecified: Secondary | ICD-10-CM

## 2013-01-26 DIAGNOSIS — N138 Other obstructive and reflux uropathy: Secondary | ICD-10-CM

## 2013-01-26 DIAGNOSIS — N259 Disorder resulting from impaired renal tubular function, unspecified: Secondary | ICD-10-CM

## 2013-01-26 DIAGNOSIS — E119 Type 2 diabetes mellitus without complications: Secondary | ICD-10-CM

## 2013-01-26 DIAGNOSIS — I10 Essential (primary) hypertension: Secondary | ICD-10-CM

## 2013-01-26 DIAGNOSIS — M1990 Unspecified osteoarthritis, unspecified site: Secondary | ICD-10-CM
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
## 2013-01-26 NOTE — Patient Instructions (Addendum)
Today we updated your med list in our EPIC system...    Continue your current medications the same...  You are set up to receive an IRON infusion 01/31/13...  We will plan to see you back 02/24/14 w/ repeat labs...  Call for any questions.Marland KitchenMarland Kitchen

## 2013-01-27 ENCOUNTER — Ambulatory Visit: Payer: Medicare Other | Admitting: Pulmonary Disease

## 2013-01-31 ENCOUNTER — Encounter (HOSPITAL_COMMUNITY): Payer: Self-pay

## 2013-01-31 ENCOUNTER — Encounter (HOSPITAL_COMMUNITY)
Admission: RE | Admit: 2013-01-31 | Discharge: 2013-01-31 | Disposition: A | Discharge status: Home / Self Care | Payer: Medicare Other | Source: Ambulatory Visit | Attending: Pulmonary Disease | Admitting: Pulmonary Disease

## 2013-01-31 ENCOUNTER — Other Ambulatory Visit (HOSPITAL_COMMUNITY): Payer: Self-pay | Admitting: Pulmonary Disease

## 2013-01-31 VITALS — BP 175/76 | HR 60 | Temp 96.9°F | Resp 18 | Ht 66.0 in | Wt 162.0 lb

## 2013-01-31 DIAGNOSIS — R7989 Other specified abnormal findings of blood chemistry: Secondary | ICD-10-CM | POA: Insufficient documentation

## 2013-01-31 DIAGNOSIS — D649 Anemia, unspecified: Secondary | ICD-10-CM

## 2013-01-31 DIAGNOSIS — E611 Iron deficiency: Secondary | ICD-10-CM

## 2013-01-31 MED ORDER — SODIUM CHLORIDE 0.9 % IV SOLN
Freq: Once | INTRAVENOUS | Status: AC
  Administered 2013-01-31: 12:00:00 via INTRAVENOUS

## 2013-01-31 MED ORDER — FERUMOXYTOL INJECTION 510 MG/17 ML
510.0000 mg | Freq: Once | INTRAVENOUS | Status: AC
  Administered 2013-01-31: 510 mg via INTRAVENOUS
  Filled 2013-01-31: qty 17

## 2013-01-31 NOTE — Progress Notes (Signed)
Uneventful infusion of Feraheme 510mg .

## 2013-02-12 ENCOUNTER — Other Ambulatory Visit: Payer: Self-pay | Admitting: Pulmonary Disease

## 2013-03-01 ENCOUNTER — Ambulatory Visit: Payer: Medicare Other | Admitting: Pulmonary Disease

## 2013-03-16 ENCOUNTER — Other Ambulatory Visit: Payer: Self-pay | Admitting: Pulmonary Disease

## 2013-04-11 ENCOUNTER — Ambulatory Visit (INDEPENDENT_AMBULATORY_CARE_PROVIDER_SITE_OTHER): Payer: Medicare Other | Admitting: Pulmonary Disease

## 2013-04-11 ENCOUNTER — Other Ambulatory Visit (INDEPENDENT_AMBULATORY_CARE_PROVIDER_SITE_OTHER): Payer: Medicare Other

## 2013-04-11 ENCOUNTER — Encounter: Payer: Self-pay | Admitting: Pulmonary Disease

## 2013-04-11 VITALS — BP 140/64 | HR 56 | Temp 98.0°F | Ht 66.0 in | Wt 153.4 lb

## 2013-04-11 DIAGNOSIS — N401 Enlarged prostate with lower urinary tract symptoms: Secondary | ICD-10-CM

## 2013-04-11 DIAGNOSIS — N259 Disorder resulting from impaired renal tubular function, unspecified: Secondary | ICD-10-CM

## 2013-04-11 DIAGNOSIS — I739 Peripheral vascular disease, unspecified: Secondary | ICD-10-CM

## 2013-04-11 DIAGNOSIS — N139 Obstructive and reflux uropathy, unspecified: Secondary | ICD-10-CM

## 2013-04-11 DIAGNOSIS — D649 Anemia, unspecified: Secondary | ICD-10-CM

## 2013-04-11 DIAGNOSIS — N138 Other obstructive and reflux uropathy: Secondary | ICD-10-CM

## 2013-04-11 DIAGNOSIS — G609 Hereditary and idiopathic neuropathy, unspecified: Secondary | ICD-10-CM

## 2013-04-11 DIAGNOSIS — Z23 Encounter for immunization: Secondary | ICD-10-CM

## 2013-04-11 DIAGNOSIS — I10 Essential (primary) hypertension: Secondary | ICD-10-CM

## 2013-04-11 DIAGNOSIS — M1990 Unspecified osteoarthritis, unspecified site: Secondary | ICD-10-CM

## 2013-04-11 DIAGNOSIS — I359 Nonrheumatic aortic valve disorder, unspecified: Secondary | ICD-10-CM

## 2013-04-11 DIAGNOSIS — E119 Type 2 diabetes mellitus without complications: Secondary | ICD-10-CM

## 2013-04-11 LAB — IBC PANEL
Iron: 44 ug/dL (ref 42–165)
Transferrin: 159.1 mg/dL — ABNORMAL LOW (ref 212.0–360.0)

## 2013-04-11 LAB — CBC WITH DIFFERENTIAL/PLATELET
Eosinophils Relative: 0.4 % (ref 0.0–5.0)
HCT: 36.3 % — ABNORMAL LOW (ref 39.0–52.0)
Lymphs Abs: 1 10*3/uL (ref 0.7–4.0)
MCV: 86.8 fl (ref 78.0–100.0)
Monocytes Relative: 5 % (ref 3.0–12.0)
Neutrophils Relative %: 83.5 % — ABNORMAL HIGH (ref 43.0–77.0)
Platelets: 194 10*3/uL (ref 150.0–400.0)
RBC: 4.18 Mil/uL — ABNORMAL LOW (ref 4.22–5.81)
RDW: 17.8 % — ABNORMAL HIGH (ref 11.5–14.6)
WBC: 9.1 10*3/uL (ref 4.5–10.5)

## 2013-04-11 NOTE — Progress Notes (Signed)
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 on file

## 2013-04-11 NOTE — Patient Instructions (Addendum)
Today we updated your med list in our EPIC system...    Continue your current medications the same...  Today we rechecked your blood count & iron level...    We will contact you w/ the results when available...   We gave you the 2014 FLU vaccine today...  Call for any questions...  Let's plan a follow up visit in 24mo, sooner if needed for problems.Marland KitchenMarland Kitchen

## 2013-04-13 ENCOUNTER — Other Ambulatory Visit: Payer: Self-pay | Admitting: Pulmonary Disease

## 2013-04-13 DIAGNOSIS — D649 Anemia, unspecified: Secondary | ICD-10-CM

## 2013-05-01 ENCOUNTER — Other Ambulatory Visit (HOSPITAL_COMMUNITY): Payer: Self-pay | Admitting: Pulmonary Disease

## 2013-05-01 ENCOUNTER — Ambulatory Visit (HOSPITAL_COMMUNITY)
Admission: RE | Admit: 2013-05-01 | Discharge: 2013-05-01 | Disposition: A | Discharge status: Home / Self Care | Payer: Medicare Other | Source: Ambulatory Visit | Attending: Pulmonary Disease | Admitting: Pulmonary Disease

## 2013-05-01 ENCOUNTER — Encounter (HOSPITAL_COMMUNITY): Payer: Self-pay

## 2013-05-01 VITALS — BP 175/56 | HR 62 | Temp 98.2°F | Resp 16 | Ht 66.0 in | Wt 153.0 lb

## 2013-05-01 DIAGNOSIS — R7989 Other specified abnormal findings of blood chemistry: Secondary | ICD-10-CM | POA: Insufficient documentation

## 2013-05-01 DIAGNOSIS — D649 Anemia, unspecified: Secondary | ICD-10-CM | POA: Insufficient documentation

## 2013-05-01 MED ORDER — SODIUM CHLORIDE 0.9 % IV SOLN
Freq: Once | INTRAVENOUS | Status: AC
  Administered 2013-05-01: 11:00:00 via INTRAVENOUS

## 2013-05-01 MED ORDER — FERUMOXYTOL INJECTION 510 MG/17 ML
510.0000 mg | Freq: Once | INTRAVENOUS | Status: AC
  Administered 2013-05-01: 510 mg via INTRAVENOUS
  Filled 2013-05-01: qty 17

## 2013-05-01 NOTE — Progress Notes (Signed)
Uneventful administration of FERAHEME #1/2 IV. BP slightly elevated after 30 minute observation but pt denies any other c/o an is eager to go home and get lunch. Ambulated to w/c with assistance and discharged in the care of his wife and son. To return 05/08/13  For 2nd infusion

## 2013-05-08 ENCOUNTER — Ambulatory Visit (HOSPITAL_COMMUNITY)
Admission: RE | Admit: 2013-05-08 | Discharge: 2013-05-08 | Disposition: A | Discharge status: Home / Self Care | Payer: Medicare Other | Source: Ambulatory Visit | Attending: Pulmonary Disease | Admitting: Pulmonary Disease

## 2013-05-08 ENCOUNTER — Encounter (HOSPITAL_COMMUNITY): Payer: Self-pay

## 2013-05-08 VITALS — BP 156/54 | HR 80 | Temp 98.2°F | Resp 18

## 2013-05-08 DIAGNOSIS — D649 Anemia, unspecified: Secondary | ICD-10-CM

## 2013-05-08 MED ORDER — FERUMOXYTOL INJECTION 510 MG/17 ML
510.0000 mg | Freq: Once | INTRAVENOUS | Status: AC
  Administered 2013-05-08: 510 mg via INTRAVENOUS
  Filled 2013-05-08: qty 17

## 2013-05-08 MED ORDER — SODIUM CHLORIDE 0.9 % IV SOLN
Freq: Once | INTRAVENOUS | Status: AC
  Administered 2013-05-08: 11:00:00 via INTRAVENOUS

## 2013-07-10 ENCOUNTER — Telehealth: Payer: Self-pay | Admitting: Pulmonary Disease

## 2013-07-10 NOTE — Telephone Encounter (Signed)
Ok to scheduled appt with SN for pt on 1/30.  thanks

## 2013-07-10 NOTE — Telephone Encounter (Signed)
Called spouse and appt scheduled. Nothing further needed 

## 2013-07-10 NOTE — Telephone Encounter (Signed)
I spoke with spoke spouse. Pt had to cancel appt for tomorrow. The hot water faucet not working and the guy going to work on it could only go to [employer]. So he had to cancel appt. Wanting to be seen in [place] and not next available. Please advise thanks

## 2013-07-11 ENCOUNTER — Ambulatory Visit: Payer: Medicare Other | Admitting: Pulmonary Disease

## 2013-07-31 ENCOUNTER — Other Ambulatory Visit: Payer: Self-pay | Admitting: Pulmonary Disease

## 2013-08-05 ENCOUNTER — Other Ambulatory Visit: Payer: Self-pay | Admitting: Pulmonary Disease

## 2013-08-11 ENCOUNTER — Encounter: Payer: Self-pay | Admitting: Pulmonary Disease

## 2013-08-11 ENCOUNTER — Ambulatory Visit (INDEPENDENT_AMBULATORY_CARE_PROVIDER_SITE_OTHER): Payer: Medicare Other | Admitting: Pulmonary Disease

## 2013-08-11 ENCOUNTER — Other Ambulatory Visit (INDEPENDENT_AMBULATORY_CARE_PROVIDER_SITE_OTHER): Payer: Medicare Other

## 2013-08-11 VITALS — BP 126/62 | HR 60 | Temp 99.0°F | Ht 66.0 in | Wt 157.0 lb

## 2013-08-11 DIAGNOSIS — K219 Gastro-esophageal reflux disease without esophagitis: Secondary | ICD-10-CM

## 2013-08-11 DIAGNOSIS — G609 Hereditary and idiopathic neuropathy, unspecified: Secondary | ICD-10-CM

## 2013-08-11 DIAGNOSIS — N138 Other obstructive and reflux uropathy: Secondary | ICD-10-CM

## 2013-08-11 DIAGNOSIS — E538 Deficiency of other specified B group vitamins: Secondary | ICD-10-CM

## 2013-08-11 DIAGNOSIS — N401 Enlarged prostate with lower urinary tract symptoms: Secondary | ICD-10-CM

## 2013-08-11 DIAGNOSIS — C61 Malignant neoplasm of prostate: Secondary | ICD-10-CM

## 2013-08-11 DIAGNOSIS — E119 Type 2 diabetes mellitus without complications: Secondary | ICD-10-CM

## 2013-08-11 DIAGNOSIS — I739 Peripheral vascular disease, unspecified: Secondary | ICD-10-CM

## 2013-08-11 DIAGNOSIS — I10 Essential (primary) hypertension: Secondary | ICD-10-CM

## 2013-08-11 DIAGNOSIS — M1990 Unspecified osteoarthritis, unspecified site: Secondary | ICD-10-CM

## 2013-08-11 DIAGNOSIS — F419 Anxiety disorder, unspecified: Secondary | ICD-10-CM

## 2013-08-11 DIAGNOSIS — N139 Obstructive and reflux uropathy, unspecified: Secondary | ICD-10-CM

## 2013-08-11 DIAGNOSIS — M545 Low back pain, unspecified: Secondary | ICD-10-CM

## 2013-08-11 DIAGNOSIS — I872 Venous insufficiency (chronic) (peripheral): Secondary | ICD-10-CM

## 2013-08-11 DIAGNOSIS — D649 Anemia, unspecified: Secondary | ICD-10-CM

## 2013-08-11 DIAGNOSIS — R269 Unspecified abnormalities of gait and mobility: Secondary | ICD-10-CM | POA: Insufficient documentation

## 2013-08-11 DIAGNOSIS — I359 Nonrheumatic aortic valve disorder, unspecified: Secondary | ICD-10-CM

## 2013-08-11 DIAGNOSIS — N183 Chronic kidney disease, stage 3 unspecified: Secondary | ICD-10-CM

## 2013-08-11 DIAGNOSIS — F411 Generalized anxiety disorder: Secondary | ICD-10-CM

## 2013-08-11 LAB — CBC WITH DIFFERENTIAL/PLATELET
BASOS PCT: 0.3 % (ref 0.0–3.0)
Basophils Absolute: 0 10*3/uL (ref 0.0–0.1)
EOS PCT: 0.7 % (ref 0.0–5.0)
Eosinophils Absolute: 0 10*3/uL (ref 0.0–0.7)
HEMATOCRIT: 35.2 % — AB (ref 39.0–52.0)
HEMOGLOBIN: 11.4 g/dL — AB (ref 13.0–17.0)
LYMPHS ABS: 0.9 10*3/uL (ref 0.7–4.0)
Lymphocytes Relative: 13.7 % (ref 12.0–46.0)
MCHC: 32.5 g/dL (ref 30.0–36.0)
MCV: 91.1 fl (ref 78.0–100.0)
MONOS PCT: 11.2 % (ref 3.0–12.0)
Monocytes Absolute: 0.8 10*3/uL (ref 0.1–1.0)
NEUTROS ABS: 5.1 10*3/uL (ref 1.4–7.7)
Neutrophils Relative %: 74.1 % (ref 43.0–77.0)
Platelets: 198 10*3/uL (ref 150.0–400.0)
RBC: 3.86 Mil/uL — ABNORMAL LOW (ref 4.22–5.81)
RDW: 16.4 % — ABNORMAL HIGH (ref 11.5–14.6)
WBC: 6.9 10*3/uL (ref 4.5–10.5)

## 2013-08-11 LAB — HEPATIC FUNCTION PANEL
ALBUMIN: 3.2 g/dL — AB (ref 3.5–5.2)
ALT: 69 U/L — ABNORMAL HIGH (ref 0–53)
AST: 33 U/L (ref 0–37)
Alkaline Phosphatase: 86 U/L (ref 39–117)
BILIRUBIN TOTAL: 0.6 mg/dL (ref 0.3–1.2)
Bilirubin, Direct: 0 mg/dL (ref 0.0–0.3)
Total Protein: 6 g/dL (ref 6.0–8.3)

## 2013-08-11 LAB — FERRITIN: FERRITIN: 800 ng/mL — AB (ref 22.0–322.0)

## 2013-08-11 LAB — IBC PANEL
Iron: 69 ug/dL (ref 42–165)
SATURATION RATIOS: 28.9 % (ref 20.0–50.0)
Transferrin: 170.7 mg/dL — ABNORMAL LOW (ref 212.0–360.0)

## 2013-08-11 LAB — BASIC METABOLIC PANEL
BUN: 35 mg/dL — AB (ref 6–23)
CALCIUM: 9.4 mg/dL (ref 8.4–10.5)
CO2: 28 mEq/L (ref 19–32)
Chloride: 110 mEq/L (ref 96–112)
Creatinine, Ser: 2.2 mg/dL — ABNORMAL HIGH (ref 0.4–1.5)
GFR: 35.21 mL/min — AB (ref >60.00)
GLUCOSE: 102 mg/dL — AB (ref 70–99)
Potassium: 5.3 mEq/L — ABNORMAL HIGH (ref 3.5–5.1)
Sodium: 143 mEq/L (ref 135–145)

## 2013-08-11 LAB — HEMOGLOBIN A1C: HEMOGLOBIN A1C: 10.5 % — AB (ref 4.6–6.5)

## 2013-08-11 LAB — TSH: TSH: 1.5 u[IU]/mL (ref 0.35–5.50)

## 2013-08-11 LAB — VITAMIN B12: VITAMIN B 12: 711 pg/mL (ref 211–911)

## 2013-08-11 LAB — PSA: PSA: 1 ng/mL (ref 0.10–4.00)

## 2013-08-11 NOTE — Progress Notes (Signed)
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 MG TABLET    Take 325 mg by mouth daily.   FERROUS SULFATE DRIED (FEOSOL) 200 (65 FE) MG TABS    Take 1 tablet by mouth every other day. Take with vitamin c 500mg  daily   FINASTERIDE (PROSCAR) 5 MG TABLET    Take 5 mg by mouth daily.   GLIPIZIDE (GLUCOTROL) 5 MG TABLET    TAKE 1 TABLET BY MOUTH DAILY   HYDRALAZINE (APRESOLINE) 50 MG TABLET    TAKE 1 TABLET BY MOUTH THREE TIMES DAILY   ISOSORBIDE MONONITRATE (IMDUR) 30 MG 24 HR TABLET    Take 1 tablet (30 mg total) by mouth daily.   MECLIZINE (ANTIVERT) 25 MG TABLET    Take 25 mg by mouth 3 (three) times daily as needed for dizziness.   MULTIPLE VITAMINS-MINERALS (MENS MULTIVITAMIN PLUS PO)    Take 1 tablet by mouth daily.   NIFEDICAL XL 30 MG 24 HR TABLET    TAKE 1 TABLET BY MOUTH TWICE DAILY   NIFEDIPINE (PROCARDIA-XL/ADALAT-CC/NIFEDICAL-XL) 30 MG 24 HR TABLET    TAKE ONE TABLET BY MOUTH TWICE DAILY   PREDNISONE (DELTASONE) 10 MG TABLET    TAKE 1 TABLET BY MOUTH DAILY   SENNA-DOCUSATE (SENOKOT-S) 8.6-50 MG PER TABLET    Take 1 tablet by mouth every other day.    TAMSULOSIN (FLOMAX) 0.4 MG CAPS CAPSULE    Take one tablet by mouth two times  Daily   VITAMIN B-12 (CYANOCOBALAMIN) 500 MCG TABLET    Take 500 mcg by mouth daily.    Modified Medications   No medications on file  Discontinued Medications   CITALOPRAM (CELEXA) 10 MG TABLET    Take 10 mg by mouth daily.

## 2013-08-11 NOTE — Patient Instructions (Signed)
Today we updated your med list in our EPIC system...    Continue your current medications the same...  Today we did your follow up blood work...    Marland KitchenWe will contact you w/ the results when available...   Stay as active as possible & be careful...  Call for any questions...  Let's plan a follow up visit in 85mo, sooner if needed for problems.Marland KitchenMarland Kitchen

## 2013-08-18 ENCOUNTER — Telehealth: Payer: Self-pay | Admitting: Pulmonary Disease

## 2013-08-18 NOTE — Telephone Encounter (Signed)
Notes Recorded by Noralee Space, MD on 08/13/2013 at 4:10 PM Please notify patient>  Chems showed BS ok but A1c way off at 10.5 & rec to continue glipiz5 one daily, better diet, no sweets, ROV 6wks & bring all med bottles w/o fail... Renal function is stable milod renal insuffic but needs to drink more water... CBC is improved- continue the Iron & vit supplements... Thyroid 7 PSA are both WNL...  LMTCB for [employer]

## 2013-08-21 NOTE — Telephone Encounter (Signed)
I spoke with [name] and she states Juanda Crumble is not there. She ask that we give the results to [facility]. I advised this has already been done. She states then no need to contact [facility]. Nothing further needed.Irena Bing, CMA

## 2013-08-27 ENCOUNTER — Other Ambulatory Visit: Payer: Self-pay | Admitting: Pulmonary Disease

## 2013-09-14 ENCOUNTER — Other Ambulatory Visit: Payer: Self-pay | Admitting: Pulmonary Disease

## 2013-09-14 DIAGNOSIS — I10 Essential (primary) hypertension: Secondary | ICD-10-CM

## 2013-09-14 DIAGNOSIS — E119 Type 2 diabetes mellitus without complications: Secondary | ICD-10-CM

## 2013-09-18 ENCOUNTER — Telehealth: Payer: Self-pay | Admitting: Pulmonary Disease

## 2013-09-18 NOTE — Telephone Encounter (Signed)
lmtcb Sally E Ottinger ° °

## 2013-09-18 NOTE — Telephone Encounter (Signed)
Called and spoke with [place]. She wants to know who he will see and when. Please advise PCC's as referral placed 09/14/13.

## 2013-09-18 NOTE — Telephone Encounter (Signed)
Spoke to bonita she was transferred to prim care office to set this pt up there Joellen [place]

## 2013-09-27 ENCOUNTER — Encounter: Payer: Self-pay | Admitting: Internal Medicine

## 2013-09-27 ENCOUNTER — Ambulatory Visit (INDEPENDENT_AMBULATORY_CARE_PROVIDER_SITE_OTHER): Payer: Medicare Other | Admitting: Internal Medicine

## 2013-09-27 VITALS — BP 150/64 | HR 60 | Temp 97.6°F | Ht 66.0 in | Wt 153.6 lb

## 2013-09-27 DIAGNOSIS — I5032 Chronic diastolic (congestive) heart failure: Secondary | ICD-10-CM | POA: Insufficient documentation

## 2013-09-27 DIAGNOSIS — Z23 Encounter for immunization: Secondary | ICD-10-CM

## 2013-09-27 DIAGNOSIS — E1129 Type 2 diabetes mellitus with other diabetic kidney complication: Secondary | ICD-10-CM

## 2013-09-27 DIAGNOSIS — I10 Essential (primary) hypertension: Secondary | ICD-10-CM

## 2013-09-27 DIAGNOSIS — N183 Chronic kidney disease, stage 3 unspecified: Secondary | ICD-10-CM

## 2013-09-27 DIAGNOSIS — I359 Nonrheumatic aortic valve disorder, unspecified: Secondary | ICD-10-CM

## 2013-09-27 DIAGNOSIS — I509 Heart failure, unspecified: Secondary | ICD-10-CM

## 2013-09-27 DIAGNOSIS — E1165 Type 2 diabetes mellitus with hyperglycemia: Principal | ICD-10-CM

## 2013-09-27 DIAGNOSIS — F0390 Unspecified dementia without behavioral disturbance: Secondary | ICD-10-CM

## 2013-09-27 LAB — HM DIABETES FOOT EXAM

## 2013-09-27 MED ORDER — FUROSEMIDE 20 MG PO TABS: 20.0000 mg | ORAL_TABLET | Freq: Two times a day (BID) | ORAL | Status: DC

## 2013-09-27 NOTE — Progress Notes (Signed)
Subjective:    Patient ID: Shane [name], male    DOB: [birth year], 80 y.o.   MRN: [id number]  Hypertension This is a chronic problem. The current episode started more than 1 year ago. The problem is unchanged. The problem is uncontrolled. Associated symptoms include malaise/fatigue, peripheral edema and shortness of breath. Pertinent negatives include no anxiety, blurred vision, chest pain, headaches, neck pain, orthopnea, palpitations, PND or sweats. Agents associated with hypertension include NSAIDs and steroids. Risk factors for coronary artery disease include male gender and diabetes mellitus. Past treatments include calcium channel blockers and direct vasodilators. The current treatment provides moderate improvement. There are no compliance problems.  Hypertensive end-organ damage includes kidney disease. Identifiable causes of hypertension include chronic renal disease.      Review of Systems  Constitutional: Positive for malaise/fatigue. Negative for fever, chills, diaphoresis, activity change, appetite change, fatigue and unexpected weight change.  HENT: Negative.   Eyes: Negative.  Negative for blurred vision.  Respiratory: Positive for shortness of breath. Negative for apnea, cough, choking, chest tightness, wheezing and stridor.   Cardiovascular: Positive for leg swelling. Negative for chest pain, palpitations, orthopnea and PND.  Gastrointestinal: Negative.  Negative for nausea, vomiting, abdominal pain, diarrhea, constipation and blood in stool.  Endocrine: Negative.   Genitourinary: Negative.   Musculoskeletal: Positive for gait problem (unsteady gait). Negative for arthralgias, back pain, joint swelling, neck pain and neck stiffness.       He has frequent falls  Skin: Negative.   Allergic/Immunologic: Negative.   Neurological: Negative.  Negative for dizziness, tremors, weakness, light-headedness, numbness and headaches.  Hematological: Negative.  Negative for adenopathy.  Does not bruise/bleed easily.  Psychiatric/Behavioral: Positive for confusion and decreased concentration. Negative for suicidal ideas, hallucinations, behavioral problems, sleep disturbance, self-injury, dysphoric mood and agitation. The patient is not nervous/anxious and is not hyperactive.        Objective:   Physical Exam  Vitals reviewed. Constitutional: He is oriented to person, place, and time. He appears well-developed and well-nourished. No distress.  HENT:  Head: Normocephalic and atraumatic.  Mouth/Throat: Oropharynx is clear and moist. No oropharyngeal exudate.  Eyes: Conjunctivae are normal. Right eye exhibits no discharge. Left eye exhibits no discharge. No scleral icterus.  Neck: Normal range of motion. Neck supple. No JVD present. No tracheal deviation present. No thyromegaly present.  Cardiovascular: Normal rate, regular rhythm, S1 normal, S2 normal and intact distal pulses.  Exam reveals no gallop, no S3 and no S4.   Murmur heard.  Decrescendo systolic murmur is present with a grade of 2/6   No diastolic murmur is present  Pulses:      Carotid pulses are 1+ on the right side, and 1+ on the left side.      Radial pulses are 1+ on the right side, and 1+ on the left side.       Femoral pulses are 1+ on the right side, and 1+ on the left side.      Popliteal pulses are 1+ on the right side, and 1+ on the left side.       Dorsalis pedis pulses are 1+ on the right side, and 1+ on the left side.       Posterior tibial pulses are 1+ on the right side, and 1+ on the left side.  Pulmonary/Chest: Effort normal and breath sounds normal. No stridor. No respiratory distress. He has no wheezes. He has no rales. He exhibits no tenderness.  Abdominal: Soft. Bowel sounds are normal. He  exhibits no distension and no mass. There is no tenderness. There is no rebound and no guarding.  Musculoskeletal: Normal range of motion. He exhibits edema (2++ pitting edema in legs). He exhibits no  tenderness.  Lymphadenopathy:    He has no cervical adenopathy.  Neurological: He is oriented to person, place, and time.  Skin: Skin is warm and dry. He is not diaphoretic. No erythema. No pallor.  Psychiatric: He has a normal mood and affect. Judgment and thought content normal. His mood appears not anxious. His affect is not angry, not blunt, not labile and not inappropriate. His speech is delayed and tangential. He is slowed and withdrawn. He is not agitated, not aggressive, not hyperactive, not actively hallucinating and not combative. Thought content is not paranoid and not delusional. Cognition and memory are impaired. He does not exhibit a depressed mood. He expresses no homicidal and no suicidal ideation. He expresses no suicidal plans and no homicidal plans. He exhibits abnormal recent memory and abnormal remote memory. He is inattentive.     Lab Results  Component Value Date   WBC 6.9 08/11/2013   HGB 11.4* 08/11/2013   HCT 35.2* 08/11/2013   PLT 198.0 08/11/2013   GLUCOSE 102* 08/11/2013   CHOL 171 05/18/2006   HDL 54.5 05/18/2006   LDLCALC 109* 05/18/2006   ALT 69* 08/11/2013   AST 33 08/11/2013   NA 143 08/11/2013   K 5.3* 08/11/2013   CL 110 08/11/2013   CREATININE 2.2* 08/11/2013   BUN 35* 08/11/2013   CO2 28 08/11/2013   TSH 1.50 08/11/2013   PSA 1.00 08/11/2013   INR 0.99 12/19/2011   HGBA1C 10.5* 08/11/2013   MICROALBUR 15.3* 11/09/2007       Assessment & Plan:

## 2013-09-27 NOTE — Progress Notes (Signed)
Pre visit review using our clinic review tool, if applicable. No additional management support is needed unless otherwise documented below in the visit note. 

## 2013-09-27 NOTE — Patient Instructions (Signed)
Edema Edema is an abnormal build-up of fluids in tissues. Because this is partly dependent on gravity (water flows to the lowest place), it is more common in the legs and thighs (lower extremities). It is also common in the looser tissues, like around the eyes. Painless swelling of the feet and ankles is common and increases as a person ages. It may affect both legs and may include the calves or even thighs. When squeezed, the fluid may move out of the affected area and may leave a dent for a few moments. CAUSES   Prolonged standing or sitting in one place for extended periods of time. Movement helps pump tissue fluid into the veins, and absence of movement prevents this, resulting in edema.  Varicose veins. The valves in the veins do not work as well as they should. This causes fluid to leak into the tissues.  Fluid and salt overload.  Injury, burn, or surgery to the leg, ankle, or foot, may damage veins and allow fluid to leak out.  Sunburn damages vessels. Leaky vessels allow fluid to go out into the sunburned tissues.  Allergies (from insect bites or stings, medications or chemicals) cause swelling by allowing vessels to become leaky.  Protein in the blood helps keep fluid in your vessels. Low protein, as in malnutrition, allows fluid to leak out.  Hormonal changes, including pregnancy and menstruation, cause fluid retention. This fluid may leak out of vessels and cause edema.  Medications that cause fluid retention. Examples are sex hormones, blood pressure medications, steroid treatment, or anti-depressants.  Some illnesses cause edema, especially heart failure, kidney disease, or liver disease.  Surgery that cuts veins or lymph nodes, such as surgery done for the heart or for breast cancer, may result in edema. DIAGNOSIS  Your caregiver is usually easily able to determine what is causing your swelling (edema) by simply asking what is wrong (getting a history) and examining you (doing  a physical). Sometimes x-rays, EKG (electrocardiogram or heart tracing), and blood work may be done to evaluate for underlying medical illness. TREATMENT  General treatment includes:  Leg elevation (or elevation of the affected body part).  Restriction of fluid intake.  Prevention of fluid overload.  Compression of the affected body part. Compression with elastic bandages or support stockings squeezes the tissues, preventing fluid from entering and forcing it back into the blood vessels.  Diuretics (also called water pills or fluid pills) pull fluid out of your body in the form of increased urination. These are effective in reducing the swelling, but can have side effects and must be used only under your caregiver's supervision. Diuretics are appropriate only for some types of edema. The specific treatment can be directed at any underlying causes discovered. Heart, liver, or kidney disease should be treated appropriately. HOME CARE INSTRUCTIONS   Elevate the legs (or affected body part) above the level of the heart, while lying down.  Avoid sitting or standing still for prolonged periods of time.  Avoid putting anything directly under the knees when lying down, and do not wear constricting clothing or garters on the upper legs.  Exercising the legs causes the fluid to work back into the veins and lymphatic channels. This may help the swelling go down.  The pressure applied by elastic bandages or support stockings can help reduce ankle swelling.  A low-salt diet may help reduce fluid retention and decrease the ankle swelling.  Take any medications exactly as prescribed. SEEK MEDICAL CARE IF:  Your edema is   not responding to recommended treatments. SEEK IMMEDIATE MEDICAL CARE IF:   You develop shortness of breath or chest pain.  You cannot breathe when you lay down; or if, while lying down, you have to get up and go to the window to get your breath.  You are having increasing  swelling without relief from treatment.  You develop a fever over 102 F (38.9 C).  You develop pain or redness in the areas that are swollen.  Tell your caregiver right away if you have gained 03 lb/1.4 kg in 1 day or 05 lb/2.3 kg in a week. MAKE SURE YOU:   Understand these instructions.  Will watch your condition.  Will get help right away if you are not doing well or get worse. Document Released: 06/29/2005 Document Revised: 12/29/2011 Document Reviewed: 02/15/2008 [employer] Patient Information 2014 [employer].  

## 2013-09-28 NOTE — Assessment & Plan Note (Signed)
He complains of edema so I have asked him to stop taking the CCB Will start lasix for edema control and for better BP control

## 2013-09-28 NOTE — Assessment & Plan Note (Signed)
He is not able or willing to get better control is his blood sugar I have asked [facility] to see him to monitor his blood sugars

## 2013-09-28 NOTE — Assessment & Plan Note (Signed)
Will start lasix and ask him to see cardiology for further evaluation

## 2013-09-28 NOTE — Assessment & Plan Note (Signed)
This is stable and does not require any further evaluation

## 2013-10-02 ENCOUNTER — Other Ambulatory Visit: Payer: Self-pay | Admitting: Pulmonary Disease

## 2013-10-03 ENCOUNTER — Other Ambulatory Visit: Payer: Self-pay | Admitting: Pulmonary Disease

## 2013-10-06 ENCOUNTER — Telehealth: Payer: Self-pay

## 2013-10-06 NOTE — Telephone Encounter (Signed)
Relevant patient education mailed to patient.  

## 2013-10-09 ENCOUNTER — Encounter: Payer: Self-pay | Admitting: Internal Medicine

## 2013-10-09 ENCOUNTER — Ambulatory Visit (INDEPENDENT_AMBULATORY_CARE_PROVIDER_SITE_OTHER): Payer: Medicare Other | Admitting: Internal Medicine

## 2013-10-09 ENCOUNTER — Other Ambulatory Visit: Payer: Self-pay

## 2013-10-09 VITALS — BP 148/60 | HR 57 | Ht 65.0 in | Wt 155.8 lb

## 2013-10-09 DIAGNOSIS — R609 Edema, unspecified: Secondary | ICD-10-CM

## 2013-10-09 DIAGNOSIS — I10 Essential (primary) hypertension: Secondary | ICD-10-CM

## 2013-10-09 DIAGNOSIS — R55 Syncope and collapse: Secondary | ICD-10-CM

## 2013-10-09 DIAGNOSIS — I872 Venous insufficiency (chronic) (peripheral): Secondary | ICD-10-CM | POA: Insufficient documentation

## 2013-10-09 DIAGNOSIS — I359 Nonrheumatic aortic valve disorder, unspecified: Secondary | ICD-10-CM

## 2013-10-09 NOTE — Progress Notes (Signed)
PCP: Noralee Space, MD  Shane [name] is a 80 y.o. male who presents today for routine cardiology followup.   Since his last visit, the patient reports doing reasonably well. He has had no further syncope.  He has occasional postural dizziness.  He has chronic edema and elevated BP.  He is on prednisone chronically for reasons that are not clear to me.  Today, he denies symptoms of palpitations, chest pain, shortness of breath, presyncope, or syncope.  The patient is otherwise without complaint today.   Past Medical History  Diagnosis Date  . Dental caries   . Hypertension   . Aortic stenosis     Echocardiogram 12/20/11: Mild LVH, EF 10-27%, grade 1 diastolic dysfunction, mild aortic stenosis, mean gradient 13, AVA 1.39 cm, mild MAC and nodular sclerosis of the tip of the anterior leaflet, mild MR, mild to moderate LAE, mild TR.  [name] Peripheral vascular disease   . Venous insufficiency   . Diabetes mellitus   . Diverticulosis of colon   . History of colonic polyps   . Renal insufficiency   . Adenocarcinoma of prostate   . Chronic low back pain   . History of syncope   . Peripheral neuropathy   . Sebaceous cyst   . Anemia   . Hyperkalemia 12/19/2011  . Syncope    No past surgical history on file.  Current Outpatient Prescriptions  Medication Sig Dispense Refill  . Artificial Tear Ointment (ARTIFICIAL TEARS) ointment 1 drop as needed.       [name] aspirin 81 MG tablet Take 81 mg by mouth daily.      . Ferrous Sulfate Dried (FEOSOL) 200 (65 FE) MG TABS Take 1 tablet by mouth every other day. Take with vitamin c 500mg  daily      . finasteride (PROSCAR) 5 MG tablet Take 5 mg by mouth daily.      . furosemide (LASIX) 20 MG tablet Take 1 tablet (20 mg total) by mouth 2 (two) times daily.  180 tablet  1  . glipiZIDE (GLUCOTROL) 5 MG tablet TAKE 1 TABLET BY MOUTH DAILY  30 tablet  6  . hydrALAZINE (APRESOLINE) 50 MG tablet TAKE 1 TABLET BY MOUTH THREE TIMES DAILY  90 tablet  1  . isosorbide  mononitrate (IMDUR) 30 MG 24 hr tablet Take 1 tablet (30 mg total) by mouth daily.  30 tablet  11  . meclizine (ANTIVERT) 25 MG tablet Take 25 mg by mouth 3 (three) times daily as needed for dizziness.      . Multiple Vitamins-Minerals (MENS MULTIVITAMIN PLUS PO) Take 1 tablet by mouth daily.      . predniSONE (DELTASONE) 10 MG tablet TAKE 1 TABLET BY MOUTH DAILY  30 tablet  0  . senna-docusate (SENOKOT-S) 8.6-50 MG per tablet Take 1 tablet by mouth every other day.       . tamsulosin (FLOMAX) 0.4 MG CAPS capsule Take one tablet by mouth two times  Daily      . vitamin B-12 (CYANOCOBALAMIN) 500 MCG tablet Take 500 mcg by mouth daily.        . vitamin C (ASCORBIC ACID) 500 MG tablet Take 500 mg by mouth daily.       No current facility-administered medications for this visit.    Physical Exam: Filed Vitals:   10/09/13 1146  BP: 148/60  Pulse: 57  Height: 5\' 5"  (1.651 m)  Weight: 155 lb 12.8 oz (70.67 kg)    GEN- The patient is chronically ill  and elderly appearing, alert and oriented x 3 today.   Head- normocephalic, atraumatic Eyes-  Sclera clear, conjunctiva pink Ears- hearing intact Oropharynx- clear Lungs- Clear to ausculation bilaterally, normal work of breathing Heart- Regular rate and rhythm, 2/6 SEM LUSB (early peaking) GI- soft, NT, ND, + BS Extremities- no clubbing, cyanosis, 2+ edema MS- in a wheelchair today  EKG today reveals sinus rhythm 57 bpm, LVH  Assessment and Plan:  1. HTN Goal BP 150/90 or below Given advanced age and dizziness, I would be reluctant to aggressively control his BP.  I think that if possible his prednisone should be tapered.  This would likely lead to better BP control as well as improvement in edema. I will defer this decision to primary care.  2. Postural dizziness Caution with diuresis  3. Edema Due to venous insufficiency and worsened by prednisone PCP to wean prednisone if able (as above) Salt restriction Support hose were  suggested though daughter does not feel that he can wear them I would not favor more aggressive diuresis as this would lead to intravascular depletion without resolution of his edema  4. Aortic stenosis- moderate by exam No a surgical candidate with advanced age  Return to see Richardson Dopp in 4 months.  We should probably address code status at that time. I will see again in 1 year

## 2013-10-09 NOTE — Patient Instructions (Signed)
Your physician recommends that you schedule a follow-up appointment in: 4 months with Richardson Dopp, PA and 12 months with Dr Carolann Littler

## 2013-10-12 ENCOUNTER — Ambulatory Visit: Payer: Medicare Other | Admitting: Pulmonary Disease

## 2013-10-14 ENCOUNTER — Other Ambulatory Visit: Payer: Self-pay | Admitting: Pulmonary Disease

## 2013-10-31 ENCOUNTER — Other Ambulatory Visit: Payer: Self-pay | Admitting: Pulmonary Disease

## 2013-11-03 ENCOUNTER — Telehealth: Payer: Self-pay | Admitting: Pulmonary Disease

## 2013-11-03 NOTE — Telephone Encounter (Signed)
Shane [name] is returning [employer].

## 2013-11-03 NOTE — Telephone Encounter (Signed)
Called and spoke with Shane [name] and she is aware that this rx will need to be faxed to Dr. Ronnald Ramp since he is the new PCP for this pt.

## 2013-11-03 NOTE — Telephone Encounter (Signed)
LMOMTCB for tamaria to discuss pt with her.

## 2013-11-20 ENCOUNTER — Other Ambulatory Visit: Payer: Self-pay | Admitting: Pulmonary Disease

## 2013-11-21 ENCOUNTER — Other Ambulatory Visit: Payer: Self-pay | Admitting: Pulmonary Disease

## 2013-11-21 ENCOUNTER — Telehealth: Payer: Self-pay | Admitting: Pulmonary Disease

## 2013-11-21 NOTE — Telephone Encounter (Signed)
Called # provided. This is no longer in service. No other # giving. Will sign off message

## 2013-11-23 ENCOUNTER — Ambulatory Visit: Payer: Medicare Other | Admitting: Internal Medicine

## 2013-11-23 DIAGNOSIS — R4689 Other symptoms and signs involving appearance and behavior: Secondary | ICD-10-CM | POA: Insufficient documentation

## 2013-11-23 DIAGNOSIS — Z0289 Encounter for other administrative examinations: Secondary | ICD-10-CM

## 2013-11-24 ENCOUNTER — Other Ambulatory Visit: Payer: Self-pay | Admitting: Internal Medicine

## 2013-12-07 ENCOUNTER — Encounter: Payer: Self-pay | Admitting: Internal Medicine

## 2013-12-07 ENCOUNTER — Other Ambulatory Visit (INDEPENDENT_AMBULATORY_CARE_PROVIDER_SITE_OTHER): Payer: Medicare Other

## 2013-12-07 ENCOUNTER — Ambulatory Visit (INDEPENDENT_AMBULATORY_CARE_PROVIDER_SITE_OTHER): Payer: Medicare Other | Admitting: Internal Medicine

## 2013-12-07 VITALS — BP 160/64 | HR 60 | Temp 98.5°F | Resp 16 | Ht 66.0 in

## 2013-12-07 DIAGNOSIS — E1165 Type 2 diabetes mellitus with hyperglycemia: Secondary | ICD-10-CM

## 2013-12-07 DIAGNOSIS — E1129 Type 2 diabetes mellitus with other diabetic kidney complication: Secondary | ICD-10-CM

## 2013-12-07 DIAGNOSIS — E785 Hyperlipidemia, unspecified: Secondary | ICD-10-CM

## 2013-12-07 DIAGNOSIS — I10 Essential (primary) hypertension: Secondary | ICD-10-CM

## 2013-12-07 DIAGNOSIS — I509 Heart failure, unspecified: Secondary | ICD-10-CM

## 2013-12-07 DIAGNOSIS — N183 Chronic kidney disease, stage 3 unspecified: Secondary | ICD-10-CM

## 2013-12-07 DIAGNOSIS — G609 Hereditary and idiopathic neuropathy, unspecified: Secondary | ICD-10-CM

## 2013-12-07 LAB — BASIC METABOLIC PANEL
BUN: 52 mg/dL — ABNORMAL HIGH (ref 6–23)
CO2: 27 mEq/L (ref 19–32)
Calcium: 9.3 mg/dL (ref 8.4–10.5)
Chloride: 104 mEq/L (ref 96–112)
Creatinine, Ser: 2.6 mg/dL — ABNORMAL HIGH (ref 0.4–1.5)
GFR: 29.63 mL/min — AB (ref >60.00)
Glucose, Bld: 179 mg/dL — ABNORMAL HIGH (ref 70–99)
Potassium: 5.7 mEq/L — ABNORMAL HIGH (ref 3.5–5.1)
SODIUM: 139 meq/L (ref 135–145)

## 2013-12-07 LAB — LIPID PANEL
CHOL/HDL RATIO: 2
Cholesterol: 186 mg/dL (ref 0–200)
HDL: 77.1 mg/dL (ref >39.00)
LDL CALC: 87 mg/dL (ref 0–99)
Triglycerides: 109 mg/dL (ref 0.0–149.0)
VLDL: 21.8 mg/dL (ref 0.0–40.0)

## 2013-12-07 LAB — TSH: TSH: 1.85 u[IU]/mL (ref 0.35–4.50)

## 2013-12-07 LAB — HEMOGLOBIN A1C: Hgb A1c MFr Bld: 9.3 % — ABNORMAL HIGH (ref 4.6–6.5)

## 2013-12-07 MED ORDER — FUROSEMIDE 40 MG PO TABS: 40.0000 mg | ORAL_TABLET | Freq: Every day | ORAL | Status: DC

## 2013-12-07 NOTE — Patient Instructions (Signed)
Type 2 Diabetes Mellitus, Adult Type 2 diabetes mellitus, often simply referred to as type 2 diabetes, is a long-lasting (chronic) disease. In type 2 diabetes, the pancreas does not make enough insulin (a hormone), the cells are less responsive to the insulin that is made (insulin resistance), or both. Normally, insulin moves sugars from food into the tissue cells. The tissue cells use the sugars for energy. The lack of insulin or the lack of normal response to insulin causes excess sugars to build up in the blood instead of going into the tissue cells. As a result, high blood sugar (hyperglycemia) develops. The effect of high sugar (glucose) levels can cause many complications. Type 2 diabetes was also previously called adult-onset diabetes but it can occur at any age.  RISK FACTORS  A person is predisposed to developing type 2 diabetes if someone in the family has the disease and also has one or more of the following primary risk factors:  Overweight.  An inactive lifestyle.  A history of consistently eating high-calorie foods. Maintaining a normal weight and regular physical activity can reduce the chance of developing type 2 diabetes. SYMPTOMS  A person with type 2 diabetes may not show symptoms initially. The symptoms of type 2 diabetes appear slowly. The symptoms include:  Increased thirst (polydipsia).  Increased urination (polyuria).  Increased urination during the night (nocturia).  Weight loss. This weight loss may be rapid.  Frequent, recurring infections.  Tiredness (fatigue).  Weakness.  Vision changes, such as blurred vision.  Fruity smell to your breath.  Abdominal pain.  Nausea or vomiting.  Cuts or bruises which are slow to heal.  Tingling or numbness in the hands or feet. DIAGNOSIS Type 2 diabetes is frequently not diagnosed until complications of diabetes are present. Type 2 diabetes is diagnosed when symptoms or complications are present and when blood  glucose levels are increased. Your blood glucose level may be checked by one or more of the following blood tests:  A fasting blood glucose test. You will not be allowed to eat for at least 8 hours before a blood sample is taken.  A random blood glucose test. Your blood glucose is checked at any time of the day regardless of when you ate.  A hemoglobin A1c blood glucose test. A hemoglobin A1c test provides information about blood glucose control over the previous 3 months.  An oral glucose tolerance test (OGTT). Your blood glucose is measured after you have not eaten (fasted) for 2 hours and then after you drink a glucose-containing beverage. TREATMENT   You may need to take insulin or diabetes medicine daily to keep blood glucose levels in the desired range.  You will need to match insulin dosing with exercise and healthy food choices. The treatment goal is to maintain the before meal blood sugar (preprandial glucose) level at 70 130 mg/dL. HOME CARE INSTRUCTIONS   Have your hemoglobin A1c level checked twice a year.  Perform daily blood glucose monitoring as directed by your caregiver.  Monitor urine ketones when you are ill and as directed by your caregiver.  Take your diabetes medicine or insulin as directed by your caregiver to maintain your blood glucose levels in the desired range.  Never run out of diabetes medicine or insulin. It is needed every day.  Adjust insulin based on your intake of carbohydrates. Carbohydrates can raise blood glucose levels but need to be included in your diet. Carbohydrates provide vitamins, minerals, and fiber which are an essential part of   a healthy diet. Carbohydrates are found in fruits, vegetables, whole grains, dairy products, legumes, and foods containing added sugars.    Eat healthy foods. Alternate 3 meals with 3 snacks.  Lose weight if overweight.  Carry a medical alert card or wear your medical alert jewelry.  Carry a 15 gram  carbohydrate snack with you at all times to treat low blood glucose (hypoglycemia). Some examples of 15 gram carbohydrate snacks include:  Glucose tablets, 3 or 4   Glucose gel, 15 gram tube  Raisins, 2 tablespoons (24 grams)  Jelly beans, 6  Animal crackers, 8  Regular pop, 4 ounces (120 mL)  Gummy treats, 9  Recognize hypoglycemia. Hypoglycemia occurs with blood glucose levels of 70 mg/dL and below. The risk for hypoglycemia increases when fasting or skipping meals, during or after intense exercise, and during sleep. Hypoglycemia symptoms can include:  Tremors or shakes.  Decreased ability to concentrate.  Sweating.  Increased heart rate.  Headache.  Dry mouth.  Hunger.  Irritability.  Anxiety.  Restless sleep.  Altered speech or coordination.  Confusion.  Treat hypoglycemia promptly. If you are alert and able to safely swallow, follow the 15:15 rule:  Take 15 20 grams of rapid-acting glucose or carbohydrate. Rapid-acting options include glucose gel, glucose tablets, or 4 ounces (120 mL) of fruit juice, regular soda, or low fat milk.  Check your blood glucose level 15 minutes after taking the glucose.  Take 15 20 grams more of glucose if the repeat blood glucose level is still 70 mg/dL or below.  Eat a meal or snack within 1 hour once blood glucose levels return to normal.    Be alert to polyuria and polydipsia which are early signs of hyperglycemia. An early awareness of hyperglycemia allows for prompt treatment. Treat hyperglycemia as directed by your caregiver.  Engage in at least 150 minutes of moderate-intensity physical activity a week, spread over at least 3 days of the week or as directed by your caregiver. In addition, you should engage in resistance exercise at least 2 times a week or as directed by your caregiver.  Adjust your medicine and food intake as needed if you start a new exercise or sport.  Follow your sick day plan at any time you  are unable to eat or drink as usual.  Avoid tobacco use.  Limit alcohol intake to no more than 1 drink per day for nonpregnant women and 2 drinks per day for men. You should drink alcohol only when you are also eating food. Talk with your caregiver whether alcohol is safe for you. Tell your caregiver if you drink alcohol several times a week.  Follow up with your caregiver regularly.  Schedule an eye exam soon after the diagnosis of type 2 diabetes and then annually.  Perform daily skin and foot care. Examine your skin and feet daily for cuts, bruises, redness, nail problems, bleeding, blisters, or sores. A foot exam by a caregiver should be done annually.  Brush your teeth and gums at least twice a day and floss at least once a day. Follow up with your dentist regularly.  Share your diabetes management plan with your workplace or school.  Stay up-to-date with immunizations.  Learn to manage stress.  Obtain ongoing diabetes education and support as needed.  Participate in, or seek rehabilitation as needed to maintain or improve independence and quality of life. Request a physical or occupational therapy referral if you are having foot or hand numbness or difficulties with grooming,   dressing, eating, or physical activity. SEEK MEDICAL CARE IF:   You are unable to eat food or drink fluids for more than 6 hours.  You have nausea and vomiting for more than 6 hours.  Your blood glucose level is over 240 mg/dL.  There is a change in mental status.  You develop an additional serious illness.  You have diarrhea for more than 6 hours.  You have been sick or have had a fever for a couple of days and are not getting better.  You have pain during any physical activity.  SEEK IMMEDIATE MEDICAL CARE IF:  You have difficulty breathing.  You have moderate to large ketone levels. MAKE SURE YOU:  Understand these instructions.  Will watch your condition.  Will get help right away if  you are not doing well or get worse. Document Released: 06/29/2005 Document Revised: 03/23/2012 Document Reviewed: 01/26/2012 [employer] Patient Information 2014 [employer].  

## 2013-12-07 NOTE — Assessment & Plan Note (Signed)
I will increase the dose of lasix to get reduce the edema

## 2013-12-07 NOTE — Progress Notes (Signed)
Subjective:    Patient ID: Shane [name], male    DOB: 05/06/1917, 80 y.o.   MRN: [id number]  Hypertension This is a chronic problem. The current episode started more than 1 year ago. The problem has been gradually worsening since onset. The problem is uncontrolled. Associated symptoms include peripheral edema. Pertinent negatives include no anxiety, blurred vision, chest pain, headaches, malaise/fatigue, neck pain, orthopnea, palpitations, PND, shortness of breath or sweats. Past treatments include diuretics and direct vasodilators. The current treatment provides moderate improvement. There are no compliance problems.  Hypertensive end-organ damage includes kidney disease and heart failure. Identifiable causes of hypertension include chronic renal disease.      Review of Systems  Constitutional: Negative.  Negative for fever, chills, malaise/fatigue, diaphoresis, appetite change and fatigue.  HENT: Negative.   Eyes: Negative.  Negative for blurred vision.  Respiratory: Negative.  Negative for cough, choking, chest tightness, shortness of breath and stridor.   Cardiovascular: Negative.  Negative for chest pain, palpitations, orthopnea and PND.  Gastrointestinal: Negative.  Negative for nausea, vomiting, abdominal pain, diarrhea, constipation and blood in stool.  Endocrine: Negative.   Genitourinary: Negative.   Musculoskeletal: Negative.  Negative for arthralgias, back pain, myalgias and neck pain.  Skin: Negative.   Allergic/Immunologic: Negative.   Neurological: Negative.  Negative for dizziness, tremors, syncope, speech difficulty, light-headedness and headaches.  Hematological: Negative.  Negative for adenopathy. Does not bruise/bleed easily.  Psychiatric/Behavioral: Negative.        Objective:   Physical Exam  Vitals reviewed. Constitutional: He is oriented to person, place, and time. He appears well-developed and well-nourished. No distress.  Wheelchair bound  HENT:  Head:  Normocephalic and atraumatic.  Mouth/Throat: Oropharynx is clear and moist. No oropharyngeal exudate.  Eyes: Conjunctivae are normal. Right eye exhibits no discharge. Left eye exhibits no discharge. No scleral icterus.  Neck: Normal range of motion. Neck supple. No JVD present. No tracheal deviation present. No thyromegaly present.  Cardiovascular: Normal rate, regular rhythm, S1 normal, S2 normal and intact distal pulses.  Exam reveals no gallop, no S3, no S4 and no friction rub.   Murmur heard.  Decrescendo systolic murmur is present with a grade of 1/6   No diastolic murmur is present  Pulses:      Carotid pulses are 1+ on the right side, and 1+ on the left side.      Radial pulses are 1+ on the right side, and 1+ on the left side.       Femoral pulses are 1+ on the right side, and 1+ on the left side.      Popliteal pulses are 1+ on the right side, and 1+ on the left side.       Dorsalis pedis pulses are 1+ on the right side, and 1+ on the left side.       Posterior tibial pulses are 1+ on the right side, and 1+ on the left side.  Pulmonary/Chest: Effort normal and breath sounds normal. No stridor. No respiratory distress. He has no wheezes. He has no rales. He exhibits no tenderness.  Abdominal: Soft. Bowel sounds are normal. He exhibits no distension and no mass. There is no tenderness. There is no rebound and no guarding.  Musculoskeletal: Normal range of motion. He exhibits edema (2+ pitting edema in BLE). He exhibits no tenderness.  Lymphadenopathy:    He has no cervical adenopathy.  Neurological: He is oriented to person, place, and time.  Skin: Skin is warm and dry. No  rash noted. He is not diaphoretic. No erythema. No pallor.  Psychiatric: He is slowed and withdrawn. Cognition and memory are impaired. He is noncommunicative. He exhibits abnormal recent memory and abnormal remote memory. He is inattentive.     Lab Results  Component Value Date   WBC 6.9 08/11/2013   HGB 11.4*  08/11/2013   HCT 35.2* 08/11/2013   PLT 198.0 08/11/2013   GLUCOSE 102* 08/11/2013   CHOL 171 05/18/2006   HDL 54.5 05/18/2006   LDLCALC 109* 05/18/2006   ALT 69* 08/11/2013   AST 33 08/11/2013   NA 143 08/11/2013   K 5.3* 08/11/2013   CL 110 08/11/2013   CREATININE 2.2* 08/11/2013   BUN 35* 08/11/2013   CO2 28 08/11/2013   TSH 1.50 08/11/2013   PSA 1.00 08/11/2013   INR 0.99 12/19/2011   HGBA1C 10.5* 08/11/2013   MICROALBUR 15.3* 11/09/2007       Assessment & Plan:

## 2013-12-07 NOTE — Assessment & Plan Note (Signed)
I will check his FLP today 

## 2013-12-07 NOTE — Assessment & Plan Note (Signed)
His BP is not well controlled In light of the edema I will increase the lasix dose

## 2013-12-07 NOTE — Assessment & Plan Note (Signed)
I will recheck his A1C today 

## 2013-12-07 NOTE — Progress Notes (Signed)
Pre visit review using our clinic review tool, if applicable. No additional management support is needed unless otherwise documented below in the visit note. 

## 2013-12-17 ENCOUNTER — Other Ambulatory Visit: Payer: Self-pay | Admitting: Pulmonary Disease

## 2013-12-22 ENCOUNTER — Ambulatory Visit: Payer: Medicare Other | Admitting: Pulmonary Disease

## 2013-12-28 ENCOUNTER — Other Ambulatory Visit: Payer: Self-pay | Admitting: Internal Medicine

## 2013-12-30 ENCOUNTER — Other Ambulatory Visit: Payer: Self-pay | Admitting: Internal Medicine

## 2014-01-01 ENCOUNTER — Emergency Department (HOSPITAL_COMMUNITY): Payer: Medicare Other

## 2014-01-01 ENCOUNTER — Encounter (HOSPITAL_COMMUNITY): Payer: Self-pay | Admitting: Emergency Medicine

## 2014-01-01 ENCOUNTER — Inpatient Hospital Stay (HOSPITAL_COMMUNITY)
Admission: EM | Admit: 2014-01-01 | Discharge: 2014-01-04 | DRG: 312 | Disposition: A | Discharge status: Skilled Nursing Facility | Payer: Medicare Other | Attending: Internal Medicine | Admitting: Internal Medicine

## 2014-01-01 ENCOUNTER — Telehealth: Payer: Self-pay | Admitting: Internal Medicine

## 2014-01-01 DIAGNOSIS — S12100A Unspecified displaced fracture of second cervical vertebra, initial encounter for closed fracture: Secondary | ICD-10-CM

## 2014-01-01 DIAGNOSIS — Z87891 Personal history of nicotine dependence: Secondary | ICD-10-CM

## 2014-01-01 DIAGNOSIS — I509 Heart failure, unspecified: Secondary | ICD-10-CM

## 2014-01-01 DIAGNOSIS — I129 Hypertensive chronic kidney disease with stage 1 through stage 4 chronic kidney disease, or unspecified chronic kidney disease: Secondary | ICD-10-CM | POA: Diagnosis present

## 2014-01-01 DIAGNOSIS — N183 Chronic kidney disease, stage 3 unspecified: Secondary | ICD-10-CM

## 2014-01-01 DIAGNOSIS — R4689 Other symptoms and signs involving appearance and behavior: Secondary | ICD-10-CM

## 2014-01-01 DIAGNOSIS — E1165 Type 2 diabetes mellitus with hyperglycemia: Secondary | ICD-10-CM | POA: Diagnosis present

## 2014-01-01 DIAGNOSIS — N390 Urinary tract infection, site not specified: Secondary | ICD-10-CM | POA: Diagnosis present

## 2014-01-01 DIAGNOSIS — E1129 Type 2 diabetes mellitus with other diabetic kidney complication: Secondary | ICD-10-CM

## 2014-01-01 DIAGNOSIS — IMO0002 Reserved for concepts with insufficient information to code with codable children: Secondary | ICD-10-CM

## 2014-01-01 DIAGNOSIS — N058 Unspecified nephritic syndrome with other morphologic changes: Secondary | ICD-10-CM | POA: Diagnosis present

## 2014-01-01 DIAGNOSIS — I498 Other specified cardiac arrhythmias: Secondary | ICD-10-CM | POA: Diagnosis present

## 2014-01-01 DIAGNOSIS — K029 Dental caries, unspecified: Secondary | ICD-10-CM | POA: Diagnosis present

## 2014-01-01 DIAGNOSIS — C61 Malignant neoplasm of prostate: Secondary | ICD-10-CM

## 2014-01-01 DIAGNOSIS — I739 Peripheral vascular disease, unspecified: Secondary | ICD-10-CM | POA: Diagnosis present

## 2014-01-01 DIAGNOSIS — I872 Venous insufficiency (chronic) (peripheral): Secondary | ICD-10-CM | POA: Diagnosis present

## 2014-01-01 DIAGNOSIS — N179 Acute kidney failure, unspecified: Secondary | ICD-10-CM | POA: Diagnosis present

## 2014-01-01 DIAGNOSIS — Z7982 Long term (current) use of aspirin: Secondary | ICD-10-CM

## 2014-01-01 DIAGNOSIS — E785 Hyperlipidemia, unspecified: Secondary | ICD-10-CM | POA: Diagnosis present

## 2014-01-01 DIAGNOSIS — N184 Chronic kidney disease, stage 4 (severe): Secondary | ICD-10-CM | POA: Diagnosis present

## 2014-01-01 DIAGNOSIS — R609 Edema, unspecified: Secondary | ICD-10-CM

## 2014-01-01 DIAGNOSIS — D649 Anemia, unspecified: Secondary | ICD-10-CM

## 2014-01-01 DIAGNOSIS — K5730 Diverticulosis of large intestine without perforation or abscess without bleeding: Secondary | ICD-10-CM | POA: Diagnosis present

## 2014-01-01 DIAGNOSIS — R209 Unspecified disturbances of skin sensation: Secondary | ICD-10-CM | POA: Diagnosis present

## 2014-01-01 DIAGNOSIS — Z79899 Other long term (current) drug therapy: Secondary | ICD-10-CM

## 2014-01-01 DIAGNOSIS — Z8546 Personal history of malignant neoplasm of prostate: Secondary | ICD-10-CM

## 2014-01-01 DIAGNOSIS — R55 Syncope and collapse: Secondary | ICD-10-CM

## 2014-01-01 DIAGNOSIS — N400 Enlarged prostate without lower urinary tract symptoms: Secondary | ICD-10-CM | POA: Diagnosis present

## 2014-01-01 DIAGNOSIS — G609 Hereditary and idiopathic neuropathy, unspecified: Secondary | ICD-10-CM | POA: Diagnosis present

## 2014-01-01 DIAGNOSIS — I359 Nonrheumatic aortic valve disorder, unspecified: Secondary | ICD-10-CM | POA: Diagnosis present

## 2014-01-01 DIAGNOSIS — I350 Nonrheumatic aortic (valve) stenosis: Secondary | ICD-10-CM

## 2014-01-01 DIAGNOSIS — I472 Ventricular tachycardia, unspecified: Secondary | ICD-10-CM | POA: Diagnosis present

## 2014-01-01 DIAGNOSIS — I951 Orthostatic hypotension: Principal | ICD-10-CM | POA: Diagnosis present

## 2014-01-01 DIAGNOSIS — W19XXXA Unspecified fall, initial encounter: Secondary | ICD-10-CM | POA: Diagnosis present

## 2014-01-01 DIAGNOSIS — I4729 Other ventricular tachycardia: Secondary | ICD-10-CM | POA: Diagnosis present

## 2014-01-01 DIAGNOSIS — I5032 Chronic diastolic (congestive) heart failure: Secondary | ICD-10-CM | POA: Diagnosis present

## 2014-01-01 DIAGNOSIS — S129XXA Fracture of neck, unspecified, initial encounter: Secondary | ICD-10-CM | POA: Diagnosis present

## 2014-01-01 DIAGNOSIS — E118 Type 2 diabetes mellitus with unspecified complications: Secondary | ICD-10-CM | POA: Diagnosis present

## 2014-01-01 DIAGNOSIS — F0390 Unspecified dementia without behavioral disturbance: Secondary | ICD-10-CM | POA: Diagnosis present

## 2014-01-01 DIAGNOSIS — I10 Essential (primary) hypertension: Secondary | ICD-10-CM

## 2014-01-01 LAB — URINALYSIS, ROUTINE W REFLEX MICROSCOPIC
BILIRUBIN URINE: NEGATIVE
Glucose, UA: NEGATIVE mg/dL
KETONES UR: NEGATIVE mg/dL
Nitrite: NEGATIVE
PH: 5.5 (ref 5.0–8.0)
PROTEIN: NEGATIVE mg/dL
Specific Gravity, Urine: 1.012 (ref 1.005–1.030)
Urobilinogen, UA: 0.2 mg/dL (ref 0.0–1.0)

## 2014-01-01 LAB — LIPASE, BLOOD: Lipase: 24 U/L (ref 11–59)

## 2014-01-01 LAB — CBC
HCT: 33.7 % — ABNORMAL LOW (ref 39.0–52.0)
HEMATOCRIT: 33.8 % — AB (ref 39.0–52.0)
HEMOGLOBIN: 10.9 g/dL — AB (ref 13.0–17.0)
Hemoglobin: 11 g/dL — ABNORMAL LOW (ref 13.0–17.0)
MCH: 28.9 pg (ref 26.0–34.0)
MCH: 28.5 pg (ref 26.0–34.0)
MCHC: 32.6 g/dL (ref 30.0–36.0)
MCHC: 32.2 g/dL (ref 30.0–36.0)
MCV: 88.7 fL (ref 78.0–100.0)
MCV: 88.5 fL (ref 78.0–100.0)
Platelets: 177 10*3/uL (ref 150–400)
Platelets: 166 10*3/uL (ref 150–400)
RBC: 3.8 MIL/uL — ABNORMAL LOW (ref 4.22–5.81)
RBC: 3.82 MIL/uL — AB (ref 4.22–5.81)
RDW: 15.3 % (ref 11.5–15.5)
RDW: 15.5 % (ref 11.5–15.5)
WBC: 8.5 10*3/uL (ref 4.0–10.5)
WBC: 6.5 10*3/uL (ref 4.0–10.5)

## 2014-01-01 LAB — URINE MICROSCOPIC-ADD ON

## 2014-01-01 LAB — GLUCOSE, CAPILLARY
GLUCOSE-CAPILLARY: 162 mg/dL — AB (ref 70–99)
Glucose-Capillary: 72 mg/dL (ref 70–99)
Glucose-Capillary: 178 mg/dL — ABNORMAL HIGH (ref 70–99)

## 2014-01-01 LAB — TSH: TSH: 0.839 u[IU]/mL (ref 0.350–4.500)

## 2014-01-01 LAB — CREATININE, SERUM
CREATININE: 2.55 mg/dL — AB (ref 0.50–1.35)
GFR, EST AFRICAN AMERICAN: 23 mL/min — AB (ref >90)
GFR, EST NON AFRICAN AMERICAN: 20 mL/min — AB (ref >90)

## 2014-01-01 LAB — HEPATIC FUNCTION PANEL
ALBUMIN: 3.2 g/dL — AB (ref 3.5–5.2)
ALK PHOS: 80 U/L (ref 39–117)
ALT: 32 U/L (ref 0–53)
AST: 49 U/L — ABNORMAL HIGH (ref 0–37)
BILIRUBIN TOTAL: 0.4 mg/dL (ref 0.3–1.2)
Total Protein: 6.9 g/dL (ref 6.0–8.3)

## 2014-01-01 LAB — PROTIME-INR
INR: 0.98 (ref 0.00–1.49)
PROTHROMBIN TIME: 12.8 s (ref 11.6–15.2)

## 2014-01-01 LAB — BASIC METABOLIC PANEL
BUN: 44 mg/dL — ABNORMAL HIGH (ref 6–23)
CALCIUM: 9.6 mg/dL (ref 8.4–10.5)
CO2: 23 mEq/L (ref 19–32)
Chloride: 103 mEq/L (ref 96–112)
Creatinine, Ser: 2.6 mg/dL — ABNORMAL HIGH (ref 0.50–1.35)
GFR, EST AFRICAN AMERICAN: 23 mL/min — AB (ref >90)
GFR, EST NON AFRICAN AMERICAN: 19 mL/min — AB (ref >90)
GLUCOSE: 47 mg/dL — AB (ref 70–99)
POTASSIUM: 5.3 meq/L (ref 3.7–5.3)
SODIUM: 142 meq/L (ref 137–147)

## 2014-01-01 LAB — I-STAT TROPONIN, ED: Troponin i, poc: 0.03 ng/mL (ref 0.00–0.08)

## 2014-01-01 LAB — PRO B NATRIURETIC PEPTIDE: Pro B Natriuretic peptide (BNP): 1850 pg/mL — ABNORMAL HIGH (ref 0–450)

## 2014-01-01 MED ORDER — HYDRALAZINE HCL 20 MG/ML IJ SOLN
10.0000 mg | Freq: Once | INTRAMUSCULAR | Status: AC
  Administered 2014-01-02: 10 mg via INTRAVENOUS
  Filled 2014-01-01: qty 1

## 2014-01-01 MED ORDER — DEXTROSE 5 % IV SOLN
1.0000 g | INTRAVENOUS | Status: DC
  Administered 2014-01-01 – 2014-01-03 (×3): 1 g via INTRAVENOUS
  Filled 2014-01-01 (×4): qty 10

## 2014-01-01 MED ORDER — ISOSORBIDE MONONITRATE ER 30 MG PO TB24: 30.0000 mg | ORAL_TABLET | Freq: Every day | ORAL | Status: DC

## 2014-01-01 MED ORDER — ASPIRIN EC 81 MG PO TBEC
81.0000 mg | DELAYED_RELEASE_TABLET | Freq: Every day | ORAL | Status: DC
  Administered 2014-01-01 – 2014-01-04 (×4): 81 mg via ORAL
  Filled 2014-01-01 (×4): qty 1

## 2014-01-01 MED ORDER — NIFEDIPINE ER 30 MG PO TB24
30.0000 mg | ORAL_TABLET | Freq: Two times a day (BID) | ORAL | Status: DC
  Administered 2014-01-01 – 2014-01-04 (×6): 30 mg via ORAL
  Filled 2014-01-01 (×8): qty 1

## 2014-01-01 MED ORDER — CYANOCOBALAMIN 500 MCG PO TABS
500.0000 ug | ORAL_TABLET | Freq: Every day | ORAL | Status: DC
  Administered 2014-01-02 – 2014-01-04 (×3): 500 ug via ORAL
  Filled 2014-01-01 (×3): qty 1

## 2014-01-01 MED ORDER — SODIUM CHLORIDE 0.9 % IJ SOLN
3.0000 mL | Freq: Two times a day (BID) | INTRAMUSCULAR | Status: DC
  Administered 2014-01-01 – 2014-01-04 (×3): 3 mL via INTRAVENOUS

## 2014-01-01 MED ORDER — FINASTERIDE 5 MG PO TABS
5.0000 mg | ORAL_TABLET | Freq: Every day | ORAL | Status: DC
  Administered 2014-01-02 – 2014-01-04 (×3): 5 mg via ORAL
  Filled 2014-01-01 (×3): qty 1

## 2014-01-01 MED ORDER — ASPIRIN 81 MG PO TABS: 81.0000 mg | ORAL_TABLET | Freq: Every day | ORAL | Status: DC

## 2014-01-01 MED ORDER — HYDRALAZINE HCL 50 MG PO TABS
50.0000 mg | ORAL_TABLET | Freq: Three times a day (TID) | ORAL | Status: DC
  Administered 2014-01-01 – 2014-01-04 (×8): 50 mg via ORAL
  Filled 2014-01-01 (×8): qty 1

## 2014-01-01 MED ORDER — HEPARIN SODIUM (PORCINE) 5000 UNIT/ML IJ SOLN
5000.0000 [IU] | Freq: Three times a day (TID) | INTRAMUSCULAR | Status: DC
  Administered 2014-01-01 – 2014-01-02 (×2): 5000 [IU] via SUBCUTANEOUS
  Filled 2014-01-01 (×3): qty 1

## 2014-01-01 MED ORDER — SODIUM CHLORIDE 0.9 % IV SOLN
INTRAVENOUS | Status: DC
  Administered 2014-01-01: 1000 mL via INTRAVENOUS

## 2014-01-01 MED ORDER — DEXTROSE 5 % IV SOLN: 1.0000 g | INTRAVENOUS | Status: DC

## 2014-01-01 MED ORDER — MECLIZINE HCL 25 MG PO TABS: 25.0000 mg | ORAL_TABLET | Freq: Three times a day (TID) | ORAL | Status: DC | PRN

## 2014-01-01 MED ORDER — TAMSULOSIN HCL 0.4 MG PO CAPS
0.4000 mg | ORAL_CAPSULE | Freq: Two times a day (BID) | ORAL | Status: DC
  Administered 2014-01-01 – 2014-01-02 (×3): 0.4 mg via ORAL
  Filled 2014-01-01 (×3): qty 1

## 2014-01-01 NOTE — Progress Notes (Signed)
Attempted report 2030, Network engineer took number for nurse to call back.

## 2014-01-01 NOTE — H&P (Addendum)
Hospitalist Admission History and Physical  Patient name: Shane [name] Medical record number: [id number] Date of birth: 28-Mar-1917 Age: 80 y.o. Gender: male  Primary Care Shane [name]: Shane Calico, MD  Chief Complaint: syncope  History of Present Illness:This is a 81 y.o. year old male with significant past medical history aortic stenosis, diastolic CHF, history of syncope, type 2 diabetes, stage 3-4 chronic kidney disease presented with syncope. Level for caveat as patient as well as family member at bedside overall very poor historians. Per report, patient has had recurrent episodes of syncope at home. Episodes seem to be predominantly when patient is on the toilet. However, he's also had episodes of syncope while walking. There has been some reported head trauma. Duration of falls is fairly nonspecific. Patient alludes to symptoms being present for at least the last year. Patient denies any shortness of breath or chest pain prior to symptoms. States he feels generally weak. No hemiparesis or confusions. Does have some mild paresthesias. Denies any diarrhea or dysuria. Is on medication for BPH. On presentation to the ER, patient afebrile. Blood pressure in the 025E to 527P systolic. Satting 96 100% on room air. White blood cell count 8.5. Hemoglobin 11.0, creatinine 2.6, BUN 44, glucose of 47. Head and C-spine CT shows a type II dense fracture along the base of the odontoid as well as cervical spine changes concerning for ankylosing spondylitis versus dig. No acute intracranial findings. Chest x-ray negative for pneumonia. Stable cardiomegaly. Urinalysis indicative of infection.  Assessment and Plan: Shane [name] is a 80 y.o. year old male presenting with syncope   Syncope: Blood differential though higher concern for neurocardiogenic sources given age and baseline cardiovascular disease. Check MRI of the brain. 2-D echo. Telemetry bed. Mildly dry on exam. Gently hydrate pending 2-D echo and  proBNP. Noted based on history of chronic edema in the setting of prednisone use as well as calcium channel blocker use. Continue to follow closely. Cardiology consult. Check vitamin D level.  Cardiovascular: Concern for this as primary source of symptoms as noted above. Somewhat labile blood pressures. Continue home regimen. PRN  hydralazine. Repeat 2-D echo. Pro BNP. Cardiology consult.  UTI: Trace leukocytes on urinalysis. Start Rocephin. Urine culture.  Dense fracture: Case discussed with all: Neurosurgery. Patient not a surgical candidate. Immobilization.  Diabetes: Sliding scale insulin. A1c. No blood sugar of 47 on admission.? The suspicion remains symptoms. Hold oral antidiabetic agents. Every 1 hour blood sugar checks. FEN/GI: N.p.o. pending bedside swallow eval. Prophylaxis: Subcutaneous heparin Disposition: Pending further evaluation Code Status: Full code   Patient Active Problem List   Diagnosis Date Noted  . Syncope 01/01/2014  . Hyperlipidemia LDL goal < 100 12/07/2013  . Non-compliant behavior 11/23/2013  . Edema 10/09/2013  . Venous insufficiency 10/09/2013  . Dementia 09/27/2013  . CHF (congestive heart failure), NYHA class I 09/27/2013  . DJD (degenerative joint disease) 01/18/2013  . CKD (chronic kidney disease) stage 3, GFR 30-59 ml/min 12/27/2011  . ADENOCARCINOMA, PROSTATE 11/09/2007  . Type II or unspecified type diabetes mellitus with renal manifestations, uncontrolled 11/09/2007  . PERIPHERAL NEUROPATHY 11/09/2007  . AORTIC STENOSIS 11/09/2007  . ANEMIA 05/17/2007  . HYPERTENSION 05/17/2007  . LOW BACK PAIN, CHRONIC 05/17/2007   Past Medical History: Past Medical History  Diagnosis Date  . Dental caries   . Hypertension   . Aortic stenosis     Echocardiogram 12/20/11: Mild LVH, EF 82-42%, grade 1 diastolic dysfunction, mild aortic stenosis, mean gradient 13, AVA 1.39 cm, mild MAC  and nodular sclerosis of the tip of the anterior leaflet, mild MR, mild  to moderate LAE, mild TR.  [name] Peripheral vascular disease   . Venous insufficiency   . Diabetes mellitus   . Diverticulosis of colon   . History of colonic polyps   . Renal insufficiency   . Adenocarcinoma of prostate   . Chronic low back pain   . History of syncope   . Peripheral neuropathy   . Sebaceous cyst   . Anemia   . Hyperkalemia 12/19/2011  . Syncope     Past Surgical History: History reviewed. No pertinent past surgical history.  Social History: History   Social History  . Marital Status: Married    Spouse Name: N/A    Number of Children: 1  . Years of Education: N/A   Occupational History  . retired from [place]  . Smoking status: Former Smoker    Types: Cigars    Quit date: 07/13/1978  . Smokeless tobacco: None     Comment: for 2-3 years  . Alcohol Use: No  . Drug Use: No  . Sexual Activity: None   Other Topics Concern  . None   Social History Narrative  . None    Family History: Family History  Problem Relation Age of Onset  . Hypertension      Allergies: Allergies  Allergen Reactions  . Nifedipine     edema    Current Facility-Administered Medications  Medication Dose Route Frequency Shane [name] Last Rate Last Dose  . 0.9 %  sodium chloride infusion   Intravenous Continuous Shane Howells, MD      . aspirin tablet 81 mg  81 mg Oral Daily Shane Howells, MD      . finasteride (PROSCAR) tablet 5 mg  5 mg Oral Daily Shane Howells, MD      . heparin injection 5,000 Units  5,000 Units Subcutaneous 3 times per day Shane Howells, MD      . hydrALAZINE (APRESOLINE) tablet 50 mg  50 mg Oral TID Shane Essex, MD   50 mg at 01/01/14 1922  . isosorbide mononitrate (IMDUR) 24 hr tablet 30 mg  30 mg Oral Daily Shane Howells, MD      . meclizine (ANTIVERT) tablet 25 mg  25 mg Oral TID PRN Shane Howells, MD      . NIFEdipine (PROCARDIA-XL/ADALAT CC) 24 hr tablet 30 mg  30 mg Oral BID Shane Howells, MD      . sodium  chloride 0.9 % injection 3 mL  3 mL Intravenous Q12H Shane Howells, MD      . tamsulosin [facility]) capsule 0.4 mg  0.4 mg Oral BID Shane Howells, MD      . vitamin B-12 (CYANOCOBALAMIN) tablet 500 mcg  500 mcg Oral Daily Shane Howells, MD       Current Outpatient Prescriptions  Medication Sig Dispense Refill  . aspirin 81 MG tablet Take 81 mg by mouth daily.      . ferrous sulfate 325 (65 FE) MG tablet Take 325 mg by mouth daily.      . finasteride (PROSCAR) 5 MG tablet Take 5 mg by mouth daily.      . furosemide (LASIX) 40 MG tablet Take 1 tablet (40 mg total) by mouth daily.  180 tablet  1  . glipiZIDE (GLUCOTROL) 5 MG tablet Take 5 mg by mouth daily.      . hydrALAZINE (APRESOLINE) 50 MG tablet Take 50  mg by mouth 3 (three) times daily.      . isosorbide mononitrate (IMDUR) 30 MG 24 hr tablet TAKE ONE TABLET BY MOUTH ONE TIME DAILY  30 tablet  0  . isosorbide mononitrate (IMDUR) 30 MG 24 hr tablet Take 30 mg by mouth daily.      . meclizine (ANTIVERT) 25 MG tablet Take 25 mg by mouth 3 (three) times daily as needed for dizziness.      . Multiple Vitamins-Minerals (MENS MULTIVITAMIN PLUS PO) Take 1 tablet by mouth daily.      [name] NIFEdipine (PROCARDIA-XL/ADALAT-CC/NIFEDICAL-XL) 30 MG 24 hr tablet Take 30 mg by mouth 2 (two) times daily.      . predniSONE (DELTASONE) 10 MG tablet Take 10 mg by mouth daily.      [name] senna-docusate (SENOKOT-S) 8.6-50 MG per tablet Take 1 tablet by mouth daily as needed for moderate constipation.       . tamsulosin (FLOMAX) 0.4 MG CAPS capsule Take 0.4 mg by mouth 2 (two) times daily.       . vitamin B-12 (CYANOCOBALAMIN) 500 MCG tablet Take 500 mcg by mouth daily.        . vitamin C (ASCORBIC ACID) 500 MG tablet Take 500 mg by mouth daily.       Review Of Systems: 12 point ROS negative except as noted above in HPI.  Physical Exam: Filed Vitals:   01/01/14 1903  BP: 213/80  Pulse:   Temp:   Resp:     General: cooperative, slowed mentation and unclear if  this is baseline  HEENT: PERRLA, extra ocular movement intact and cervical collar in place  Heart: S1, S2 normal, no murmur, rub or gallop, regular rate and rhythm Lungs: clear to auscultation, no wheezes or rales and unlabored breathing Abdomen: abdomen is soft without significant tenderness, masses, organomegaly or guarding Extremities: extremities normal, atraumatic, no cyanosis or edema Skin:no rashes, no ecchymoses Neurology: normal without focal findings  Labs and Imaging: Lab Results  Component Value Date/Time   NA 142 01/01/2014  3:20 PM   K 5.3 01/01/2014  3:20 PM   CL 103 01/01/2014  3:20 PM   CO2 23 01/01/2014  3:20 PM   BUN 44* 01/01/2014  3:20 PM   CREATININE 2.60* 01/01/2014  3:20 PM   GLUCOSE 47* 01/01/2014  3:20 PM   GLUCOSE 115* 05/18/2006  8:03 AM   Lab Results  Component Value Date   WBC 8.5 01/01/2014   HGB 11.0* 01/01/2014   HCT 33.7* 01/01/2014   MCV 88.7 01/01/2014   PLT 177 01/01/2014   Urinalysis    Component Value Date/Time   COLORURINE YELLOW 01/01/2014 1551   APPEARANCEUR CLEAR 01/01/2014 1551   LABSPEC 1.012 01/01/2014 1551   PHURINE 5.5 01/01/2014 1551   GLUCOSEU NEGATIVE 01/01/2014 1551   GLUCOSEU NEGATIVE 03/31/2010 1520   HGBUR SMALL* 01/01/2014 1551   BILIRUBINUR NEGATIVE 01/01/2014 1551   KETONESUR NEGATIVE 01/01/2014 1551   PROTEINUR NEGATIVE 01/01/2014 1551   UROBILINOGEN 0.2 01/01/2014 1551   NITRITE NEGATIVE 01/01/2014 1551   LEUKOCYTESUR TRACE* 01/01/2014 1551       Dg Chest 2 View  01/01/2014   CLINICAL DATA:  fall  EXAM: CHEST  2 VIEW  COMPARISON:  Portable chest radiograph 12/26/2011  FINDINGS: Stable cardiomegaly. Lungs are clear. Degenerative changes in the shoulders. No acute osseous abnormalities. Atherosclerotic calcifications in the aorta.  IMPRESSION: No active cardiopulmonary disease.  Stable cardiomegaly.   Electronically Signed   By: Margaree Mackintosh  M.D.   On: 01/01/2014 16:38   Ct Head Wo Contrast  01/01/2014   CLINICAL DATA:  LOSS OF  CONSCIOUSNESS FALL NECK PAIN  EXAM: CT HEAD WITHOUT CONTRAST  CT CERVICAL SPINE WITHOUT CONTRAST  TECHNIQUE: Multidetector CT imaging of the head and cervical spine was performed following the standard protocol without intravenous contrast. Multiplanar CT image reconstructions of the cervical spine were also generated.  COMPARISON:  None.  Head CT 12/22/2011, 12/19/2011 head and C-spine, brain MRI dated 12/20/2011  FINDINGS: CT HEAD FINDINGS  No acute intracranial abnormality. Specifically, no hemorrhage, hydrocephalus, mass lesion, acute infarction, or significant intracranial injury. No acute calvarial abnormality. Age-appropriate global atrophy. Diffuse areas of low attenuation within the subcortical, deep, and periventricular white matter regions consistent with small vessel white matter ischaemia. Stable changes of chronic sinusitis within the paranasal sinuses. Mastoid air cells are patent.  CT CERVICAL SPINE FINDINGS  Type 2 dens fracture along the base of the odontoid. There is angulation posteriorly and to the right with left lateral and posterior displacement. Neurosurgical consultation and immobilization recommended. No further fracture nor dislocation. There does not appear to be appreciable canal stenosis.  Diffuse bridging osteophytes are appreciated throughout the cervical spine is extending to the upper thoracic spine. Differential considerations include changes secondary to ankylosing spondylitis verses DISH. There are degenerative disc disease changes within the lumbar spine with areas of vacuum disc, endplate sclerosis. The disc spaces are relatively maintained. Areas of facet sclerosis within the mid and lower cervical spine. There is no evidence of canal stenosis. The surrounding soft tissues appear unremarkable. There are areas of mild scarring within the lung apices.  IMPRESSION: Type 2 dens fracture along the base the odontoid. These results were called by telephone at the time of  interpretation on 01/01/2014 at 5:54 PM to Dr. Ezequiel [name] , who verbally acknowledged these results.  Findings within the cervical spine may represent sequela of ankylosing spondylitis versus DISH  Multilevel spondylosis of the cervical spine  No acute intracranial abnormality.  Chronic an and lucent changes.   Electronically Signed   By: Margaree Mackintosh M.D.   On: 01/01/2014 17:58   Ct Cervical Spine Wo Contrast  01/01/2014   CLINICAL DATA:  LOSS OF CONSCIOUSNESS FALL NECK PAIN  EXAM: CT HEAD WITHOUT CONTRAST  CT CERVICAL SPINE WITHOUT CONTRAST  TECHNIQUE: Multidetector CT imaging of the head and cervical spine was performed following the standard protocol without intravenous contrast. Multiplanar CT image reconstructions of the cervical spine were also generated.  COMPARISON:  None.  Head CT 12/22/2011, 12/19/2011 head and C-spine, brain MRI dated 12/20/2011  FINDINGS: CT HEAD FINDINGS  No acute intracranial abnormality. Specifically, no hemorrhage, hydrocephalus, mass lesion, acute infarction, or significant intracranial injury. No acute calvarial abnormality. Age-appropriate global atrophy. Diffuse areas of low attenuation within the subcortical, deep, and periventricular white matter regions consistent with small vessel white matter ischaemia. Stable changes of chronic sinusitis within the paranasal sinuses. Mastoid air cells are patent.  CT CERVICAL SPINE FINDINGS  Type 2 dens fracture along the base of the odontoid. There is angulation posteriorly and to the right with left lateral and posterior displacement. Neurosurgical consultation and immobilization recommended. No further fracture nor dislocation. There does not appear to be appreciable canal stenosis.  Diffuse bridging osteophytes are appreciated throughout the cervical spine is extending to the upper thoracic spine. Differential considerations include changes secondary to ankylosing spondylitis verses DISH. There are degenerative disc disease  changes within the lumbar spine with areas  of vacuum disc, endplate sclerosis. The disc spaces are relatively maintained. Areas of facet sclerosis within the mid and lower cervical spine. There is no evidence of canal stenosis. The surrounding soft tissues appear unremarkable. There are areas of mild scarring within the lung apices.  IMPRESSION: Type 2 dens fracture along the base the odontoid. These results were called by telephone at the time of interpretation on 01/01/2014 at 5:54 PM to Dr. Ezequiel [name] , who verbally acknowledged these results.  Findings within the cervical spine may represent sequela of ankylosing spondylitis versus DISH  Multilevel spondylosis of the cervical spine  No acute intracranial abnormality.  Chronic an and lucent changes.   Electronically Signed   By: Margaree Mackintosh M.D.   On: 01/01/2014 17:58           Shane Howells MD  Pager: [phone]

## 2014-01-01 NOTE — Progress Notes (Signed)
Pt arrived via stretcher at 2034 by Nena Polio. Pt accompanied by family. Pt in bed, bed alarm on, with call bell in reach. Will continue to monitor.

## 2014-01-01 NOTE — ED Provider Notes (Signed)
CSN: [id number]     Arrival date & time 01/01/14  1436 History   First MD Initiated Contact with Patient 01/01/14 1458     Chief Complaint  Patient presents with  . Loss of Consciousness  . Fall  . Neck Pain     (Consider location/radiation/quality/duration/timing/severity/associated sxs/prior Treatment) HPI Comments: Patient from home with fall versus syncopal episode. His wife states patient was standing and fell backwards striking his head on the door. He endorses feeling dizzy and lightheadedness before this. Denies any chest pain or shortness of breath. Having more frequent falls recently including 1 yesterday. He complains of posterior head and neck pain. Denies chest pain or shortness of breath. No abdominal pain and low back pain. No focal weakness, numbness or tingling. No bowel or bladder incontinence. No tongue biting. Patient has a history of aortic stenosis, diabetes. He is having some slurred speech with the family says his baseline.  The history is provided by the patient and the spouse. The history is limited by the condition of the patient.    Past Medical History  Diagnosis Date  . Dental caries   . Hypertension   . Aortic stenosis     Echocardiogram 12/20/11: Mild LVH, EF 63-33%, grade 1 diastolic dysfunction, mild aortic stenosis, mean gradient 13, AVA 1.39 cm, mild MAC and nodular sclerosis of the tip of the anterior leaflet, mild MR, mild to moderate LAE, mild TR.  [name] Peripheral vascular disease   . Venous insufficiency   . Diabetes mellitus   . Diverticulosis of colon   . History of colonic polyps   . Renal insufficiency   . Adenocarcinoma of prostate   . Chronic low back pain   . History of syncope   . Peripheral neuropathy   . Sebaceous cyst   . Anemia   . Hyperkalemia 12/19/2011  . Syncope    History reviewed. No pertinent past surgical history. Family History  Problem Relation Age of Onset  . Hypertension     History  Substance Use Topics  . Smoking  status: Former Smoker    Types: Cigars    Quit date: 07/13/1978  . Smokeless tobacco: Not on file     Comment: for 2-3 years  . Alcohol Use: No    Review of Systems  Constitutional: Negative for fever, activity change and appetite change.  Respiratory: Negative for cough, chest tightness and shortness of breath.   Cardiovascular: Negative for chest pain.  Gastrointestinal: Negative for nausea, vomiting and abdominal pain.  Genitourinary: Negative for dysuria, urgency and hematuria.  Musculoskeletal: Positive for neck pain. Negative for arthralgias and myalgias.  Skin: Negative for wound.  Neurological: Positive for dizziness, syncope and light-headedness. Negative for weakness.  A complete 10 system review of systems was obtained and all systems are negative except as noted in the HPI and PMH.      Allergies  Nifedipine  Home Medications   Prior to Admission medications   Medication Sig Start Date End Date Taking? Authorizing [place]  aspirin 81 MG tablet Take 81 mg by mouth daily.   Yes Historical Indyah Saulnier, MD  ferrous sulfate 325 (65 FE) MG tablet Take 325 mg by mouth daily.   Yes Historical Aniaya Bacha, MD  finasteride (PROSCAR) 5 MG tablet Take 5 mg by mouth daily.   Yes Historical Blaklee Shores, MD  furosemide (LASIX) 40 MG tablet Take 1 tablet (40 mg total) by mouth daily. 12/07/13  Yes Janith Lima, MD  glipiZIDE (GLUCOTROL) 5 MG tablet Take  5 mg by mouth daily.   Yes Historical Aliah Eriksson, MD  hydrALAZINE (APRESOLINE) 50 MG tablet Take 50 mg by mouth 3 (three) times daily.   Yes Historical Waddell Iten, MD  isosorbide mononitrate (IMDUR) 30 MG 24 hr tablet TAKE ONE TABLET BY MOUTH ONE TIME DAILY   Yes Noralee Space, MD  isosorbide mononitrate (IMDUR) 30 MG 24 hr tablet Take 30 mg by mouth daily.   Yes Historical Marcellino Fidalgo, MD  meclizine (ANTIVERT) 25 MG tablet Take 25 mg by mouth 3 (three) times daily as needed for dizziness.   Yes Historical Ashaun Gaughan, MD  Multiple Vitamins-Minerals  (MENS MULTIVITAMIN PLUS PO) Take 1 tablet by mouth daily.   Yes Historical Damyia Strider, MD  NIFEdipine (PROCARDIA-XL/ADALAT-CC/NIFEDICAL-XL) 30 MG 24 hr tablet Take 30 mg by mouth 2 (two) times daily.   Yes Historical Amma Crear, MD  predniSONE (DELTASONE) 10 MG tablet Take 10 mg by mouth daily.   Yes Historical Lester Crickenberger, MD  senna-docusate (SENOKOT-S) 8.6-50 MG per tablet Take 1 tablet by mouth daily as needed for moderate constipation.    Yes Historical Cindia Hustead, MD  tamsulosin (FLOMAX) 0.4 MG CAPS capsule Take 0.4 mg by mouth 2 (two) times daily.  09/27/12  Yes Noralee Space, MD  vitamin B-12 (CYANOCOBALAMIN) 500 MCG tablet Take 500 mcg by mouth daily.     Yes Historical Lavoris Canizales, MD  vitamin C (ASCORBIC ACID) 500 MG tablet Take 500 mg by mouth daily.   Yes Historical Orvil Faraone, MD   BP 198/82  Pulse 50  Temp(Src) 98 F (36.7 C)  Resp 18  SpO2 98% Physical Exam  Constitutional: He appears well-developed and well-nourished. No distress.  HENT:  Head: Normocephalic and atraumatic.  Mouth/Throat: Oropharynx is clear and moist. No oropharyngeal exudate.  Eyes: Conjunctivae and EOM are normal. Pupils are equal, round, and reactive to light.  Neck: Normal range of motion.  Paraspinal C-spine tenderness without midline pain  Cardiovascular: Normal rate and regular rhythm.   Murmur heard. Pulmonary/Chest: Effort normal and breath sounds normal. No respiratory distress.  Abdominal: Soft. There is no tenderness. There is no rebound and no guarding.  Musculoskeletal: Normal range of motion. He exhibits no edema and no tenderness.  Neurological: He is alert. No cranial nerve deficit. He exhibits normal muscle tone. Coordination normal.  Oriented x2. Cranial nerves 2-12 intact, 5 of 5 strength throughout  Skin: Skin is warm.    ED Course  Procedures (including critical care time) Labs Review Labs Reviewed  CBC - Abnormal; Notable for the following:    RBC 3.80 (*)    Hemoglobin 11.0 (*)    HCT  33.7 (*)    All other components within normal limits  BASIC METABOLIC PANEL - Abnormal; Notable for the following:    Glucose, Bld 47 (*)    BUN 44 (*)    Creatinine, Ser 2.60 (*)    GFR calc non Af Amer 19 (*)    GFR calc Af Amer 23 (*)    All other components within normal limits  HEPATIC FUNCTION PANEL - Abnormal; Notable for the following:    Albumin 3.2 (*)    AST 49 (*)    All other components within normal limits  URINALYSIS, ROUTINE W REFLEX MICROSCOPIC - Abnormal; Notable for the following:    Hgb urine dipstick SMALL (*)    Leukocytes, UA TRACE (*)    All other components within normal limits  CBC - Abnormal; Notable for the following:    RBC 3.82 (*)  Hemoglobin 10.9 (*)    HCT 33.8 (*)    All other components within normal limits  CREATININE, SERUM - Abnormal; Notable for the following:    Creatinine, Ser 2.55 (*)    GFR calc non Af Amer 20 (*)    GFR calc Af Amer 23 (*)    All other components within normal limits  PRO B NATRIURETIC PEPTIDE - Abnormal; Notable for the following:    Pro B Natriuretic peptide (BNP) 1850.0 (*)    All other components within normal limits  GLUCOSE, CAPILLARY - Abnormal; Notable for the following:    Glucose-Capillary 178 (*)    All other components within normal limits  GLUCOSE, CAPILLARY - Abnormal; Notable for the following:    Glucose-Capillary 162 (*)    All other components within normal limits  GLUCOSE, CAPILLARY - Abnormal; Notable for the following:    Glucose-Capillary 151 (*)    All other components within normal limits  URINE CULTURE  PROTIME-INR  LIPASE, BLOOD  URINE MICROSCOPIC-ADD ON  TSH  GLUCOSE, CAPILLARY  COMPREHENSIVE METABOLIC PANEL  CBC WITH DIFFERENTIAL  HEMOGLOBIN A1C  VITAMIN D 1,25 DIHYDROXY  VITAMIN D 25 HYDROXY  I-STAT TROPOININ, ED    Imaging Review Dg Chest 2 View  01/01/2014   CLINICAL DATA:  fall  EXAM: CHEST  2 VIEW  COMPARISON:  Portable chest radiograph 12/26/2011  FINDINGS: Stable  cardiomegaly. Lungs are clear. Degenerative changes in the shoulders. No acute osseous abnormalities. Atherosclerotic calcifications in the aorta.  IMPRESSION: No active cardiopulmonary disease.  Stable cardiomegaly.   Electronically Signed   By: Margaree Mackintosh M.D.   On: 01/01/2014 16:38   Ct Head Wo Contrast  01/01/2014   CLINICAL DATA:  LOSS OF CONSCIOUSNESS FALL NECK PAIN  EXAM: CT HEAD WITHOUT CONTRAST  CT CERVICAL SPINE WITHOUT CONTRAST  TECHNIQUE: Multidetector CT imaging of the head and cervical spine was performed following the standard protocol without intravenous contrast. Multiplanar CT image reconstructions of the cervical spine were also generated.  COMPARISON:  None.  Head CT 12/22/2011, 12/19/2011 head and C-spine, brain MRI dated 12/20/2011  FINDINGS: CT HEAD FINDINGS  No acute intracranial abnormality. Specifically, no hemorrhage, hydrocephalus, mass lesion, acute infarction, or significant intracranial injury. No acute calvarial abnormality. Age-appropriate global atrophy. Diffuse areas of low attenuation within the subcortical, deep, and periventricular white matter regions consistent with small vessel white matter ischaemia. Stable changes of chronic sinusitis within the paranasal sinuses. Mastoid air cells are patent.  CT CERVICAL SPINE FINDINGS  Type 2 dens fracture along the base of the odontoid. There is angulation posteriorly and to the right with left lateral and posterior displacement. Neurosurgical consultation and immobilization recommended. No further fracture nor dislocation. There does not appear to be appreciable canal stenosis.  Diffuse bridging osteophytes are appreciated throughout the cervical spine is extending to the upper thoracic spine. Differential considerations include changes secondary to ankylosing spondylitis verses DISH. There are degenerative disc disease changes within the lumbar spine with areas of vacuum disc, endplate sclerosis. The disc spaces are relatively  maintained. Areas of facet sclerosis within the mid and lower cervical spine. There is no evidence of canal stenosis. The surrounding soft tissues appear unremarkable. There are areas of mild scarring within the lung apices.  IMPRESSION: Type 2 dens fracture along the base the odontoid. These results were called by telephone at the time of interpretation on 01/01/2014 at 5:54 PM to Dr. Ezequiel Essex , who verbally acknowledged these results.  Findings within the  cervical spine may represent sequela of ankylosing spondylitis versus DISH  Multilevel spondylosis of the cervical spine  No acute intracranial abnormality.  Chronic an and lucent changes.   Electronically Signed   By: Margaree Mackintosh M.D.   On: 01/01/2014 17:58   Ct Cervical Spine Wo Contrast  01/01/2014   CLINICAL DATA:  LOSS OF CONSCIOUSNESS FALL NECK PAIN  EXAM: CT HEAD WITHOUT CONTRAST  CT CERVICAL SPINE WITHOUT CONTRAST  TECHNIQUE: Multidetector CT imaging of the head and cervical spine was performed following the standard protocol without intravenous contrast. Multiplanar CT image reconstructions of the cervical spine were also generated.  COMPARISON:  None.  Head CT 12/22/2011, 12/19/2011 head and C-spine, brain MRI dated 12/20/2011  FINDINGS: CT HEAD FINDINGS  No acute intracranial abnormality. Specifically, no hemorrhage, hydrocephalus, mass lesion, acute infarction, or significant intracranial injury. No acute calvarial abnormality. Age-appropriate global atrophy. Diffuse areas of low attenuation within the subcortical, deep, and periventricular white matter regions consistent with small vessel white matter ischaemia. Stable changes of chronic sinusitis within the paranasal sinuses. Mastoid air cells are patent.  CT CERVICAL SPINE FINDINGS  Type 2 dens fracture along the base of the odontoid. There is angulation posteriorly and to the right with left lateral and posterior displacement. Neurosurgical consultation and immobilization recommended.  No further fracture nor dislocation. There does not appear to be appreciable canal stenosis.  Diffuse bridging osteophytes are appreciated throughout the cervical spine is extending to the upper thoracic spine. Differential considerations include changes secondary to ankylosing spondylitis verses DISH. There are degenerative disc disease changes within the lumbar spine with areas of vacuum disc, endplate sclerosis. The disc spaces are relatively maintained. Areas of facet sclerosis within the mid and lower cervical spine. There is no evidence of canal stenosis. The surrounding soft tissues appear unremarkable. There are areas of mild scarring within the lung apices.  IMPRESSION: Type 2 dens fracture along the base the odontoid. These results were called by telephone at the time of interpretation on 01/01/2014 at 5:54 PM to Dr. Ezequiel Essex , who verbally acknowledged these results.  Findings within the cervical spine may represent sequela of ankylosing spondylitis versus DISH  Multilevel spondylosis of the cervical spine  No acute intracranial abnormality.  Chronic an and lucent changes.   Electronically Signed   By: Margaree Mackintosh M.D.   On: 01/01/2014 17:58     EKG Interpretation None      MDM   Final diagnoses:  Syncope, unspecified syncope type  Aortic stenosis  Dens fracture, closed, initial encounter   Syncope versus fall. Patient with known history of aortic stenosis. Level 5 caveat for dementia and overall poor historian. EKG is unchanged. Murmur is heard on exam. C/o head and neck pain.  Creatinine stable at 2.6. Glucose 47.  D50 given.  Patient found to have type II dens fracture. Discussed with Dr. Saintclair Halsted of neurosurgery. Patient will remain in hard cervical collar. He is not an operative candidate given his age.  Concern for cardiogenic syncope given aortic stenosis and recurrent syncopal episodes. Likely poor surgical candidate. Will admit for echo, cardiology consult and further  workup of syncope.   Date: 01/01/2014  Rate: 61  Rhythm: normal sinus rhythm  QRS Axis: normal  Intervals: PR prolonged  ST/T Wave abnormalities: normal  Conduction Disutrbances:none  Narrative Interpretation:   Old EKG Reviewed: unchanged    Ezequiel Essex, MD 01/02/14 [id number]

## 2014-01-01 NOTE — ED Notes (Signed)
MD Rancour made aware of pts blood pressure.  Medications to be ordered.

## 2014-01-01 NOTE — Telephone Encounter (Signed)
PT CAREGIVER CAME BY TO DISCUSS PT NOT TAKING BP MEDS SINCE MAY BECAUSE RX HAS NOT BE REFILLED. CAREGIVER STATES THAT SHE SPOKE WITH PHARMACY AND REQUEST HAS BEEN DENIED. CAREGIVER WOULD LIKE TO KNOW WHY THIS REQUEST IS DENIED BECAUSE PT REALLY NEEDS THIS REFILL. PLEASE CONTACT CAREGIVER WHEN REQUEST HAS BEEN REVIEWED.

## 2014-01-01 NOTE — ED Notes (Signed)
RN holding c-spine precautions while cervical collar applied.

## 2014-01-01 NOTE — ED Notes (Signed)
Per pt sts that he blacked out this am and fell. sts has been having similar episodes like this over the past few years. sts that he is currently having neck pain from the fall.

## 2014-01-02 DIAGNOSIS — I5032 Chronic diastolic (congestive) heart failure: Secondary | ICD-10-CM

## 2014-01-02 DIAGNOSIS — I359 Nonrheumatic aortic valve disorder, unspecified: Secondary | ICD-10-CM

## 2014-01-02 DIAGNOSIS — I509 Heart failure, unspecified: Secondary | ICD-10-CM

## 2014-01-02 DIAGNOSIS — S129XXA Fracture of neck, unspecified, initial encounter: Secondary | ICD-10-CM

## 2014-01-02 LAB — HEMOGLOBIN A1C
Hgb A1c MFr Bld: 9.1 % — ABNORMAL HIGH (ref <5.7)
Hgb A1c MFr Bld: 9 % — ABNORMAL HIGH (ref <5.7)
Mean Plasma Glucose: 214 mg/dL — ABNORMAL HIGH (ref <117)
Mean Plasma Glucose: 212 mg/dL — ABNORMAL HIGH (ref <117)

## 2014-01-02 LAB — CBC WITH DIFFERENTIAL/PLATELET
BASOS PCT: 0 % (ref 0–1)
Basophils Absolute: 0 10*3/uL (ref 0.0–0.1)
EOS ABS: 0 10*3/uL (ref 0.0–0.7)
EOS PCT: 0 % (ref 0–5)
HCT: 35.1 % — ABNORMAL LOW (ref 39.0–52.0)
Hemoglobin: 11.2 g/dL — ABNORMAL LOW (ref 13.0–17.0)
Lymphocytes Relative: 9 % — ABNORMAL LOW (ref 12–46)
Lymphs Abs: 0.6 10*3/uL — ABNORMAL LOW (ref 0.7–4.0)
MCH: 28.6 pg (ref 26.0–34.0)
MCHC: 31.9 g/dL (ref 30.0–36.0)
MCV: 89.8 fL (ref 78.0–100.0)
Monocytes Absolute: 0.7 10*3/uL (ref 0.1–1.0)
Monocytes Relative: 10 % (ref 3–12)
Neutro Abs: 5.7 10*3/uL (ref 1.7–7.7)
Neutrophils Relative %: 81 % — ABNORMAL HIGH (ref 43–77)
PLATELETS: 175 10*3/uL (ref 150–400)
RBC: 3.91 MIL/uL — ABNORMAL LOW (ref 4.22–5.81)
RDW: 15.4 % (ref 11.5–15.5)
WBC: 7.1 10*3/uL (ref 4.0–10.5)

## 2014-01-02 LAB — GLUCOSE, CAPILLARY
GLUCOSE-CAPILLARY: 126 mg/dL — AB (ref 70–99)
GLUCOSE-CAPILLARY: 85 mg/dL (ref 70–99)
GLUCOSE-CAPILLARY: 193 mg/dL — AB (ref 70–99)
GLUCOSE-CAPILLARY: 227 mg/dL — AB (ref 70–99)
GLUCOSE-CAPILLARY: 323 mg/dL — AB (ref 70–99)
Glucose-Capillary: 115 mg/dL — ABNORMAL HIGH (ref 70–99)
Glucose-Capillary: 126 mg/dL — ABNORMAL HIGH (ref 70–99)
Glucose-Capillary: 135 mg/dL — ABNORMAL HIGH (ref 70–99)
Glucose-Capillary: 151 mg/dL — ABNORMAL HIGH (ref 70–99)
Glucose-Capillary: 177 mg/dL — ABNORMAL HIGH (ref 70–99)
Glucose-Capillary: 83 mg/dL (ref 70–99)

## 2014-01-02 LAB — COMPREHENSIVE METABOLIC PANEL
ALK PHOS: 77 U/L (ref 39–117)
ALT: 28 U/L (ref 0–53)
AST: 38 U/L — AB (ref 0–37)
Albumin: 2.9 g/dL — ABNORMAL LOW (ref 3.5–5.2)
BILIRUBIN TOTAL: 0.3 mg/dL (ref 0.3–1.2)
BUN: 39 mg/dL — AB (ref 6–23)
CHLORIDE: 101 meq/L (ref 96–112)
CO2: 27 mEq/L (ref 19–32)
Calcium: 9.4 mg/dL (ref 8.4–10.5)
Creatinine, Ser: 2.41 mg/dL — ABNORMAL HIGH (ref 0.50–1.35)
GFR calc Af Amer: 25 mL/min — ABNORMAL LOW (ref >90)
GFR calc non Af Amer: 21 mL/min — ABNORMAL LOW (ref >90)
Glucose, Bld: 83 mg/dL (ref 70–99)
POTASSIUM: 4.9 meq/L (ref 3.7–5.3)
SODIUM: 142 meq/L (ref 137–147)
TOTAL PROTEIN: 6.6 g/dL (ref 6.0–8.3)

## 2014-01-02 LAB — TROPONIN I: Troponin I: <0.3 ng/mL (ref <0.30)

## 2014-01-02 LAB — VITAMIN D 25 HYDROXY (VIT D DEFICIENCY, FRACTURES): Vit D, 25-Hydroxy: 39 ng/mL (ref 30–89)

## 2014-01-02 MED ORDER — VITAMIN C 500 MG PO TABS
500.0000 mg | ORAL_TABLET | Freq: Every day | ORAL | Status: DC
  Administered 2014-01-02 – 2014-01-04 (×3): 500 mg via ORAL
  Filled 2014-01-02 (×3): qty 1

## 2014-01-02 MED ORDER — INSULIN ASPART 100 UNIT/ML ~~LOC~~ SOLN
0.0000 [IU] | Freq: Three times a day (TID) | SUBCUTANEOUS | Status: DC
  Administered 2014-01-02: 3 [IU] via SUBCUTANEOUS
  Administered 2014-01-02: 7 [IU] via SUBCUTANEOUS
  Administered 2014-01-03: 1 [IU] via SUBCUTANEOUS
  Administered 2014-01-03: 2 [IU] via SUBCUTANEOUS
  Administered 2014-01-04: 1 [IU] via SUBCUTANEOUS
  Administered 2014-01-04: 2 [IU] via SUBCUTANEOUS

## 2014-01-02 MED ORDER — FERROUS SULFATE 325 (65 FE) MG PO TABS
325.0000 mg | ORAL_TABLET | Freq: Every day | ORAL | Status: DC
  Administered 2014-01-02 – 2014-01-04 (×3): 325 mg via ORAL
  Filled 2014-01-02 (×3): qty 1

## 2014-01-02 MED ORDER — HEPARIN SODIUM (PORCINE) 5000 UNIT/ML IJ SOLN
5000.0000 [IU] | Freq: Three times a day (TID) | INTRAMUSCULAR | Status: DC
  Administered 2014-01-04: 5000 [IU] via SUBCUTANEOUS
  Filled 2014-01-02: qty 1

## 2014-01-02 MED ORDER — PREDNISONE 5 MG PO TABS
10.0000 mg | ORAL_TABLET | Freq: Every day | ORAL | Status: DC
  Administered 2014-01-02: 10 mg via ORAL
  Filled 2014-01-02: qty 2

## 2014-01-02 MED ORDER — INSULIN GLARGINE 100 UNIT/ML ~~LOC~~ SOLN
6.0000 [IU] | Freq: Every day | SUBCUTANEOUS | Status: DC
  Administered 2014-01-02 – 2014-01-04 (×3): 6 [IU] via SUBCUTANEOUS
  Filled 2014-01-02 (×3): qty 0.06

## 2014-01-02 MED ORDER — ISOSORBIDE MONONITRATE ER 30 MG PO TB24
15.0000 mg | ORAL_TABLET | Freq: Every day | ORAL | Status: DC
  Administered 2014-01-02: 15 mg via ORAL
  Filled 2014-01-02: qty 1

## 2014-01-02 MED ORDER — HYDROCODONE-ACETAMINOPHEN 5-325 MG PO TABS
1.0000 | ORAL_TABLET | Freq: Four times a day (QID) | ORAL | Status: DC | PRN
  Administered 2014-01-02: 1 via ORAL
  Filled 2014-01-02: qty 1

## 2014-01-02 NOTE — Progress Notes (Signed)
CARE MANAGEMENT NOTE 01/02/2014  Patient:  [name]   Account Number:  [id number]  Date Initiated:  01/02/2014  Documentation initiated by:  Shane [name]  Subjective/Objective Assessment:   ADMITTED WITH SYNCOPY, UTI, BACK FRACTURE     Action/Plan:   CM FOLLOWING FOR DCP   Anticipated DC Date:  01/06/2014   Anticipated DC Plan:  [facility] SKILLED [facility]URSING FAC[facility]  In-house referral  Clinical Social Worker      DC Planning Services  CM consult         Status of service:  In process, will continue to follow Medicare Important Message given?   (If response is "NO", the following Medicare IM given date fields will be blank)  Per UR Regulation:  Reviewed for med. necessity/level of care/duration of stay  Comments:  6/23/2015Mindi Slicker RN,BSN,MHA [phone]

## 2014-01-02 NOTE — Progress Notes (Addendum)
Patient Demographics  Shane [name], is a 80 y.o. male, DOB - 04/08/1917, TGG:[id number]  Admit date - 01/01/2014   Admitting Physician Shanda Howells, MD  Outpatient Primary MD for the patient is Shane Calico, MD  LOS - 1   Chief Complaint  Patient presents with  . Loss of Consciousness  . Fall  . Neck Pain           Subjective:   [name] today has, No headache, No chest pain, No abdominal pain - No Nausea, No new weakness tingling or numbness, No Cough - SOB.      Assessment & Plan    1. Recurrent syncope with history of bradycardia in the past. Also has underlying aortic stenosis, venous insufficiency and an alpha blockers. Continue to monitor on telemetry, cycle troponin, check TSH which appears to be stable, will monitor orthostatics nursing staff requested, will check echogram and request cardiology to evaluate. May require placement of the    2. C-spine fracture. Case was discussed by ER physician with neurosurgeon on call Dr. Saintclair Halsted, not an operative candidate due to advanced age, c-collar for at least 6 weeks with one month followup with neurosurgery in the office. No deficits.    3. Elevated A1c. Undiagnosed type 2 diabetes mellitus. We will place on Lantus along with low-dose sliding scale and monitor. Home dose Glucotrol on hold.    4. Hypertension. Stable continue present regimen, not on any rate controlling medications.    5. BPH. An alpha blockers continue.    6. ? UTI. On Rocephin follow cultures.    7. History of aortic stenosis. Check echogram.    8. Mild ARF on Chronic kidney disease stage IV. Creatinine appears to be improving, baseline creatinine around 2.4. Hold Lasix one more day responded well to hydration.    9.Small bleed at the Penile  tip, no cute, hold Heparin, monitor.     Code Status: Full  Family Communication: None present  Disposition Plan: May require SNF   Procedures CT head C-spine, echogram,   Consults Neurosurgeon Dr. Saintclair Halsted was consulted over the phone by ER physician, c-collar for 6-8 weeks and follow with him in the office poor operative candidate.  Consulted cardiology   Medications  Scheduled Meds: . aspirin EC  81 mg Oral Daily  . cefTRIAXone (ROCEPHIN)  IV  1 g Intravenous Q24H  . cyanocobalamin  500 mcg Oral Daily  . ferrous sulfate  325 mg Oral Daily  . finasteride  5 mg Oral Daily  . heparin  5,000 Units Subcutaneous 3 times per day  . hydrALAZINE  50 mg Oral TID  . insulin aspart  0-9 Units Subcutaneous TID WC  . insulin glargine  6 Units Subcutaneous Daily  . isosorbide mononitrate  15 mg Oral Daily  . NIFEdipine  30 mg Oral BID  . predniSONE  10 mg Oral Daily  . sodium chloride  3 mL Intravenous Q12H  . tamsulosin  0.4 mg Oral BID  . vitamin C  500 mg Oral Daily    Continuous Infusions:  PRN Meds:.meclizine  DVT Prophylaxis   SCDs- Heparin held for 2 days  Lab Results  Component Value Date   PLT 175 01/02/2014    Antibiotics  Anti-infectives   Start     Dose/Rate Route Frequency Ordered Stop   01/01/14 2100  cefTRIAXone (ROCEPHIN) 1 g in dextrose 5 % 50 mL IVPB     1 g 100 mL/hr over 30 Minutes Intravenous Every 24 hours 01/01/14 2036     01/01/14 2000  cefTRIAXone (ROCEPHIN) 1 g in dextrose 5 % 50 mL IVPB  Status:  Discontinued     1 g 100 mL/hr over 30 Minutes Intravenous Every 24 hours 01/01/14 1955 01/01/14 2036          Objective:   Filed Vitals:   01/01/14 2347 01/02/14 0500 01/02/14 0526 01/02/14 1045  BP: 198/82  191/74 155/70  Pulse: 50  57 56  Temp: 98 F (36.7 C)  97.9 F (36.6 C) 97.6 F (36.4 C)  TempSrc:   Oral Oral  Resp: 18  18 18   Weight:  69.627 kg (153 lb 8 oz)    SpO2: 98%  92% 97%    Wt Readings from Last 3 Encounters:    01/02/14 69.627 kg (153 lb 8 oz)  10/09/13 70.67 kg (155 lb 12.8 oz)  09/27/13 69.673 kg (153 lb 9.6 oz)     Intake/Output Summary (Last 24 hours) at 01/02/14 1111 Last data filed at 01/02/14 0830  Gross per 24 hour  Intake    468 ml  Output   1000 ml  Net   -532 ml     Physical Exam  Awake Alert, Oriented X 3, No new F.N deficits, Normal affect [place].AT,PERRAL Wearing c-collar,No JVD, No cervical lymphadenopathy appriciated.  Symmetrical Chest wall movement, Good air movement bilaterally, CTAB RRR,No Gallops,Rubs , +ve systolic Murmur, No Parasternal Heave +ve B.Sounds, Abd Soft, No tenderness, No organomegaly appriciated, No rebound - guarding or rigidity. No Cyanosis, Clubbing or edema, No new Rash or bruise    Data Review   Micro Results No results found for this or any previous visit (from the past 240 hour(s)).  Radiology Reports Dg Chest 2 View  01/01/2014   CLINICAL DATA:  fall  EXAM: CHEST  2 VIEW  COMPARISON:  Portable chest radiograph 12/26/2011  FINDINGS: Stable cardiomegaly. Lungs are clear. Degenerative changes in the shoulders. No acute osseous abnormalities. Atherosclerotic calcifications in the aorta.  IMPRESSION: No active cardiopulmonary disease.  Stable cardiomegaly.   Electronically Signed   By: Margaree Mackintosh M.D.   On: 01/01/2014 16:38   Ct Head Wo Contrast  01/01/2014   CLINICAL DATA:  LOSS OF CONSCIOUSNESS FALL NECK PAIN  EXAM: CT HEAD WITHOUT CONTRAST  CT CERVICAL SPINE WITHOUT CONTRAST  TECHNIQUE: Multidetector CT imaging of the head and cervical spine was performed following the standard protocol without intravenous contrast. Multiplanar CT image reconstructions of the cervical spine were also generated.  COMPARISON:  None.  Head CT 12/22/2011, 12/19/2011 head and C-spine, brain MRI dated 12/20/2011  FINDINGS: CT HEAD FINDINGS  No acute intracranial abnormality. Specifically, no hemorrhage, hydrocephalus, mass lesion, acute infarction, or significant  intracranial injury. No acute calvarial abnormality. Age-appropriate global atrophy. Diffuse areas of low attenuation within the subcortical, deep, and periventricular white matter regions consistent with small vessel white matter ischaemia. Stable changes of chronic sinusitis within the paranasal sinuses. Mastoid air cells are patent.  CT CERVICAL SPINE FINDINGS  Type 2 dens fracture along the base of the odontoid. There is angulation posteriorly and to the right with left lateral and posterior displacement. Neurosurgical consultation and immobilization recommended. No further fracture nor dislocation. There does not appear to be  appreciable canal stenosis.  Diffuse bridging osteophytes are appreciated throughout the cervical spine is extending to the upper thoracic spine. Differential considerations include changes secondary to ankylosing spondylitis verses DISH. There are degenerative disc disease changes within the lumbar spine with areas of vacuum disc, endplate sclerosis. The disc spaces are relatively maintained. Areas of facet sclerosis within the mid and lower cervical spine. There is no evidence of canal stenosis. The surrounding soft tissues appear unremarkable. There are areas of mild scarring within the lung apices.  IMPRESSION: Type 2 dens fracture along the base the odontoid. These results were called by telephone at the time of interpretation on 01/01/2014 at 5:54 PM to Dr. Ezequiel Essex , who verbally acknowledged these results.  Findings within the cervical spine may represent sequela of ankylosing spondylitis versus DISH  Multilevel spondylosis of the cervical spine  No acute intracranial abnormality.  Chronic an and lucent changes.   Electronically Signed   By: Margaree Mackintosh M.D.   On: 01/01/2014 17:58   Ct Cervical Spine Wo Contrast  01/01/2014   CLINICAL DATA:  LOSS OF CONSCIOUSNESS FALL NECK PAIN  EXAM: CT HEAD WITHOUT CONTRAST  CT CERVICAL SPINE WITHOUT CONTRAST  TECHNIQUE: Multidetector  CT imaging of the head and cervical spine was performed following the standard protocol without intravenous contrast. Multiplanar CT image reconstructions of the cervical spine were also generated.  COMPARISON:  None.  Head CT 12/22/2011, 12/19/2011 head and C-spine, brain MRI dated 12/20/2011  FINDINGS: CT HEAD FINDINGS  No acute intracranial abnormality. Specifically, no hemorrhage, hydrocephalus, mass lesion, acute infarction, or significant intracranial injury. No acute calvarial abnormality. Age-appropriate global atrophy. Diffuse areas of low attenuation within the subcortical, deep, and periventricular white matter regions consistent with small vessel white matter ischaemia. Stable changes of chronic sinusitis within the paranasal sinuses. Mastoid air cells are patent.  CT CERVICAL SPINE FINDINGS  Type 2 dens fracture along the base of the odontoid. There is angulation posteriorly and to the right with left lateral and posterior displacement. Neurosurgical consultation and immobilization recommended. No further fracture nor dislocation. There does not appear to be appreciable canal stenosis.  Diffuse bridging osteophytes are appreciated throughout the cervical spine is extending to the upper thoracic spine. Differential considerations include changes secondary to ankylosing spondylitis verses DISH. There are degenerative disc disease changes within the lumbar spine with areas of vacuum disc, endplate sclerosis. The disc spaces are relatively maintained. Areas of facet sclerosis within the mid and lower cervical spine. There is no evidence of canal stenosis. The surrounding soft tissues appear unremarkable. There are areas of mild scarring within the lung apices.  IMPRESSION: Type 2 dens fracture along the base the odontoid. These results were called by telephone at the time of interpretation on 01/01/2014 at 5:54 PM to Dr. Ezequiel Essex , who verbally acknowledged these results.  Findings within the cervical  spine may represent sequela of ankylosing spondylitis versus DISH  Multilevel spondylosis of the cervical spine  No acute intracranial abnormality.  Chronic an and lucent changes.   Electronically Signed   By: Margaree Mackintosh M.D.   On: 01/01/2014 17:58    CBC  Recent Labs Lab 01/01/14 1520 01/01/14 2007 01/02/14 0418  WBC 8.5 6.5 7.1  HGB 11.0* 10.9* 11.2*  HCT 33.7* 33.8* 35.1*  PLT 177 166 175  MCV 88.7 88.5 89.8  MCH 28.9 28.5 28.6  MCHC 32.6 32.2 31.9  RDW 15.3 15.5 15.4  LYMPHSABS  --   --  0.6*  MONOABS  --   --  0.7  EOSABS  --   --  0.0  BASOSABS  --   --  0.0    Chemistries   Recent Labs Lab 01/01/14 1520 01/01/14 2007 01/02/14 0418  NA 142  --  142  K 5.3  --  4.9  CL 103  --  101  CO2 23  --  27  GLUCOSE 47*  --  83  BUN 44*  --  39*  CREATININE 2.60* 2.55* 2.41*  CALCIUM 9.6  --  9.4  AST 49*  --  38*  ALT 32  --  28  ALKPHOS 80  --  77  BILITOT 0.4  --  0.3   ------------------------------------------------------------------------------------------------------------------ CrCl is unknown because both a height and weight (above a minimum accepted value) are required for this calculation. ------------------------------------------------------------------------------------------------------------------  Recent Labs  01/01/14 2007  HGBA1C 9.1*   ------------------------------------------------------------------------------------------------------------------ No results found for this basename: CHOL, HDL, LDLCALC, TRIG, CHOLHDL, LDLDIRECT,  in the last 72 hours ------------------------------------------------------------------------------------------------------------------  Recent Labs  01/01/14 2007  TSH 0.839   ------------------------------------------------------------------------------------------------------------------ No results found for this basename: VITAMINB12, FOLATE, FERRITIN, TIBC, IRON, RETICCTPCT,  in the last 72  hours  Coagulation profile  Recent Labs Lab 01/01/14 1520  INR 0.98    No results found for this basename: DDIMER,  in the last 72 hours  Cardiac Enzymes No results found for this basename: CK, CKMB, TROPONINI, MYOGLOBIN,  in the last 168 hours ------------------------------------------------------------------------------------------------------------------ No components found with this basename: POCBNP,      Time Spent in minutes   35   SINGH,PRASHANT K M.D on 01/02/2014 at 11:11 AM  Between 7am to 7pm - Pager - [phone]  After 7pm go to [email or web address] - password [email or web address]  And look for the night coverage person covering for me after hours  [facility] Office  [phone]   **Disclaimer: This note may have been dictated with voice recognition software. Similar sounding words can inadvertently be transcribed and this note may contain transcription errors which may not have been corrected upon publication of note.**

## 2014-01-02 NOTE — Evaluation (Signed)
Physical Therapy Evaluation Patient Details Name: Shane [name] MRN: [id number] DOB: [birth year] Today's Date: 01/02/2014   History of Present Illness  Pt is a 80 y/o male previously living independently at home with his wife. Presents with syncope episodes causing falls. Per family during PT eval, states pt has fallen when getting up from the toilet as well as while ambulating, but also just slips out of his chair or off the bed while sitting. Cervical spine fracture from a recent fall and pt is in a hard collar.   Clinical Impression  Pt admitted with the above. Pt currently with functional limitations due to the deficits listed below (see PT Problem List). At the time of PT eval, pt was incontinent of urine x2. Requires frequent cues for safety and technique during mobility. Pt will benefit from skilled PT to increase their independence and safety with mobility to allow discharge to the venue listed below.       Follow Up Recommendations SNF;Supervision/Assistance - 24 hour    Equipment Recommendations  None recommended by PT    Recommendations for Other Services       Precautions / Restrictions Precautions Precautions: Fall Required Braces or Orthoses: Cervical Brace Cervical Brace: Hard collar Restrictions Weight Bearing Restrictions: No      Mobility  Bed Mobility               General bed mobility comments: Pt sitting up in the recliner upon PT arrival.  Transfers Overall transfer level: Needs assistance Equipment used: Rolling walker (2 wheeled) Transfers: Sit to/from Stand Sit to Stand: Min assist;Mod assist;+2 physical assistance;+2 safety/equipment         General transfer comment: VC's for hand placement on seated surface for safety. Assist to power-up to full standing and to keep walker on the ground as pt trying to lift it up to ambulate.  Ambulation/Gait Ambulation/Gait assistance: Min assist Ambulation Distance (Feet): 25 Feet Assistive device:  Rolling walker (2 wheeled) Gait Pattern/deviations: Shuffle;Trunk flexed;Narrow base of support Gait velocity: Decreased Gait velocity interpretation: Below normal speed for age/gender General Gait Details: gait training ended prematurely due to incontinence of urine. Assist was required for directing walker as well as for pt unsteadiness.   Stairs            Wheelchair Mobility    Modified Rankin (Stroke Patients Only)       Balance Overall balance assessment: Needs assistance Sitting-balance support: Feet supported;Bilateral upper extremity supported Sitting balance-Leahy Scale: Fair     Standing balance support: Bilateral upper extremity supported Standing balance-Leahy Scale: Poor                               Pertinent Vitals/Pain Vitals stable throughout session.     Home Living Family/patient expects to be discharged to:: Skilled nursing facility                      Prior Function Level of Independence: Independent with assistive device(s)         Comments: Family reports pt was independent with his RW but pt and wife also share a cane occasionally.      Hand Dominance   Dominant Hand: Right    Extremity/Trunk Assessment   Upper Extremity Assessment: Defer to OT evaluation           Lower Extremity Assessment: Generalized weakness      Cervical / Trunk Assessment: Other exceptions  Communication   Communication: HOH  Cognition Arousal/Alertness: Awake/alert Behavior During Therapy: WFL for tasks assessed/performed Overall Cognitive Status: Within Functional Limits for tasks assessed                      General Comments      Exercises        Assessment/Plan    PT Assessment Patient needs continued PT services  PT Diagnosis Difficulty walking;Generalized weakness   PT Problem List Decreased strength;Decreased range of motion;Decreased activity tolerance;Decreased balance;Decreased  mobility;Decreased knowledge of use of DME;Decreased safety awareness;Decreased knowledge of precautions  PT Treatment Interventions DME instruction;Gait training;Stair training;Functional mobility training;Therapeutic activities;Therapeutic exercise;Neuromuscular re-education;Patient/family education   PT Goals (Current goals can be found in the Care Plan section) Acute Rehab PT Goals Patient Stated Goal: None stated PT Goal Formulation: With patient/family Time For Goal Achievement: 01/09/14 Potential to Achieve Goals: Good    Frequency Min 2X/week   Barriers to discharge Decreased caregiver support Wife states she cares for him and assists him at home. Wife appeared to have difficulty mobilizing around the room and used a wheelchair to enter the hallway.     Co-evaluation               End of Session Equipment Utilized During Treatment: Gait belt;Cervical collar Activity Tolerance: Patient limited by fatigue Patient left: in chair;with chair alarm set;with call bell/phone within reach;with family/visitor present Nurse Communication: Mobility status         Time: 7510-2585 PT Time Calculation (min): 32 min   Charges:   PT Evaluation $Initial PT Evaluation Tier I: 1 Procedure PT Treatments $Therapeutic Activity: 23-37 mins   PT G CodesJolyn [name] 01/02/2014, 4:51 PM   Shane [name], PT, DPT Acute Rehabilitation Services Pager: [phone]

## 2014-01-02 NOTE — Progress Notes (Signed)
Pt restless and impulsive, attempting to get out of bed frequently, MD notified.  Pt placed on sitter list.  Will monitor.

## 2014-01-02 NOTE — Consult Note (Signed)
CARDIOLOGY CONSULT NOTE   Patient ID: Shane [name] MRN: [id number] DOB/AGE: February 26, 1917 80 y.o.  Admit date: 01/01/2014  Primary Physician   [name], MD Primary Cardiologist/Primary Electrophysiologist: Dr. Thompson Grayer  Reason for Consultation bradycardia and syncope  HPI: Shane [name] is a 80 y.o. male with a history of aortic stenosis, diastolic CHF, history of syncope, type 2 diabetes, stage 3-4 CKD who presented to [facility] ED last night with syncope.   Per report, patient has had recurrent episodes of syncope at home. Episodes seem to be predominantly when patient is standing up from sitting when he needs to use the bathroom. He states that he feels lightheaded and his legs get weak and he suddenly passes out. He has an associated C-spine fracture from one of his falls. Duration of falls is fairly nonspecific. Patient alludes to symptoms being present for at least the last year, but states that he's had multiple episodes this past week. Patient denies any nausea, palpitations, shortness of breath or chest pain prior to symptoms. States he feels generally weak. No hemiparesis or confusions. Does have some mild paresthesias. Denies any diarrhea or dysuria.   Echocardiogram 12/20/11: Mild LVH, EF 21-19%, grade 1 diastolic dysfunction, mild aortic stenosis, mean gradient 13, AVA 1.39 cm, mild MAC and nodular sclerosis of the tip of the anterior leaflet, mild MR, mild to moderate LAE, mild TR.    Past Medical History  Diagnosis Date  . Dental caries   . Hypertension   . Aortic stenosis     Echocardiogram 12/20/11: Mild LVH, EF 41-74%, grade 1 diastolic dysfunction, mild aortic stenosis, mean gradient 13, AVA 1.39 cm, mild MAC and nodular sclerosis of the tip of the anterior leaflet, mild MR, mild to moderate LAE, mild TR.  [name] Peripheral vascular disease   . Venous insufficiency   . Diabetes mellitus   . Diverticulosis of colon   . History of colonic polyps   . Renal  insufficiency   . Adenocarcinoma of prostate   . Chronic low back pain   . History of syncope   . Peripheral neuropathy   . Sebaceous cyst   . Anemia   . Hyperkalemia 12/19/2011  . Syncope      History reviewed. No pertinent past surgical history.  Allergies  Allergen Reactions  . Nifedipine     edema    I have reviewed the patient's current medications . aspirin EC  81 mg Oral Daily  . cefTRIAXone (ROCEPHIN)  IV  1 g Intravenous Q24H  . cyanocobalamin  500 mcg Oral Daily  . ferrous sulfate  325 mg Oral Daily  . finasteride  5 mg Oral Daily  . [START ON 01/04/2014] heparin  5,000 Units Subcutaneous 3 times per day  . hydrALAZINE  50 mg Oral TID  . insulin aspart  0-9 Units Subcutaneous TID WC  . insulin glargine  6 Units Subcutaneous Daily  . isosorbide mononitrate  15 mg Oral Daily  . NIFEdipine  30 mg Oral BID  . predniSONE  10 mg Oral Daily  . sodium chloride  3 mL Intravenous Q12H  . tamsulosin  0.4 mg Oral BID  . vitamin C  500 mg Oral Daily     meclizine  Prior to Admission medications   Medication Sig Start Date End Date Taking? Authorizing Provider  aspirin 81 MG tablet Take 81 mg by mouth daily.   Yes Historical Provider, MD  ferrous sulfate 325 (65 FE) MG tablet Take 325 mg  by mouth daily.   Yes Historical Provider, MD  finasteride (PROSCAR) 5 MG tablet Take 5 mg by mouth daily.   Yes Historical Provider, MD  furosemide (LASIX) 40 MG tablet Take 1 tablet (40 mg total) by mouth daily. 12/07/13  Yes Janith Lima, MD  glipiZIDE (GLUCOTROL) 5 MG tablet Take 5 mg by mouth daily.   Yes Historical Provider, MD  hydrALAZINE (APRESOLINE) 50 MG tablet Take 50 mg by mouth 3 (three) times daily.   Yes Historical Provider, MD  isosorbide mononitrate (IMDUR) 30 MG 24 hr tablet TAKE ONE TABLET BY MOUTH ONE TIME DAILY   Yes Noralee Space, MD  isosorbide mononitrate (IMDUR) 30 MG 24 hr tablet Take 30 mg by mouth daily.   Yes Historical Provider, MD  meclizine (ANTIVERT) 25 MG  tablet Take 25 mg by mouth 3 (three) times daily as needed for dizziness.   Yes Historical Provider, MD  Multiple Vitamins-Minerals (MENS MULTIVITAMIN PLUS PO) Take 1 tablet by mouth daily.   Yes Historical Provider, MD  NIFEdipine (PROCARDIA-XL/ADALAT-CC/NIFEDICAL-XL) 30 MG 24 hr tablet Take 30 mg by mouth 2 (two) times daily.   Yes Historical Provider, MD  predniSONE (DELTASONE) 10 MG tablet Take 10 mg by mouth daily.   Yes Historical Provider, MD  senna-docusate (SENOKOT-S) 8.6-50 MG per tablet Take 1 tablet by mouth daily as needed for moderate constipation.    Yes Historical Provider, MD  tamsulosin (FLOMAX) 0.4 MG CAPS capsule Take 0.4 mg by mouth 2 (two) times daily.  09/27/12  Yes Noralee Space, MD  vitamin B-12 (CYANOCOBALAMIN) 500 MCG tablet Take 500 mcg by mouth daily.     Yes Historical Provider, MD  vitamin C (ASCORBIC ACID) 500 MG tablet Take 500 mg by mouth daily.   Yes Historical Provider, MD     History   Social History  . Marital Status: Married    Spouse Name: N/A    Number of Children: 1  . Years of Education: N/A   Occupational History  . retired from [employer]  . Smoking status: Former Smoker    Types: Cigars    Quit date: 07/13/1978  . Smokeless tobacco: Not on file     Comment: for 2-3 years  . Alcohol Use: No  . Drug Use: No  . Sexual Activity: Not on file   Other Topics Concern  . Not on file   Social History Narrative  . No narrative on file    Family Status  Relation Status Death Age  . Mother      heart disease   Family History  Problem Relation Age of Onset  . Hypertension       ROS:  Full 14 point review of systems complete and found to be negative unless listed above.  Physical Exam: Blood pressure 155/70, pulse 56, temperature 97.6 F (36.4 C), temperature source Oral, resp. rate 18, weight 153 lb 8 oz (69.627 kg), SpO2 97.00%.  General: Well developed, well nourished, male in no acute distress.  Elderly frail  Head: Eyes PERRLA, No xanthomas.   Normocephalic and atraumatic, oropharynx without edema or exudate. Dentition:  Lungs: CTAB Heart: HRRR S1 S2, no rub/gallop, Heart irregular rate and rhythm with S1, S2  murmur. pulses are 2+ extrem.   Neck: No carotid bruits. No lymphadenopathy. No JVD. Abdomen: Bowel sounds present, abdomen soft and non-tender without masses or hernias noted. Msk:  No spine or cva tenderness. No weakness, no  joint deformities or effusions. Extremities: No clubbing or cyanosis.  edema.  Neuro: Alert and oriented X 3. No focal deficits noted. Psych:  Good affect, responds appropriately Skin: No rashes or lesions noted.  Labs:   Lab Results  Component Value Date   WBC 7.1 01/02/2014   HGB 11.2* 01/02/2014   HCT 35.1* 01/02/2014   MCV 89.8 01/02/2014   PLT 175 01/02/2014    Recent Labs  01/01/14 1520  INR 0.98    Recent Labs Lab 01/02/14 0418  NA 142  K 4.9  CL 101  CO2 27  BUN 39*  CREATININE 2.41*  CALCIUM 9.4  PROT 6.6  BILITOT 0.3  ALKPHOS 77  ALT 28  AST 38*  GLUCOSE 83  ALBUMIN 2.9*    Recent Labs  01/01/14 1532  TROPIPOC 0.03   Pro B Natriuretic peptide (BNP)  Date/Time Value Ref Range Status  01/01/2014  8:07 PM 1850.0* 0 - 450 pg/mL Final   Lab Results  Component Value Date   CHOL 186 12/07/2013   HDL 77.10 12/07/2013   LDLCALC 87 12/07/2013   TRIG 109.0 12/07/2013   Lipase  Date/Time Value Ref Range Status  01/01/2014  3:20 PM 24  11 - 59 U/L Final   TSH  Date/Time Value Ref Range Status  01/01/2014  8:07 PM 0.839  0.350 - 4.500 uIU/mL Final    Echo: Echocardiogram 12/20/11: Mild LVH, EF 74-25%, grade 1 diastolic dysfunction, mild aortic stenosis, mean gradient 13, AVA 1.39 cm, mild MAC and nodular sclerosis of the tip of the anterior leaflet, mild MR, mild to moderate LAE, mild TR.  ECG:  Sinus brady HR 57   Radiology:  Dg Chest 2 View  01/01/2014   CLINICAL DATA:  fall  EXAM: CHEST  2 VIEW  COMPARISON:   Portable chest radiograph 12/26/2011  FINDINGS: Stable cardiomegaly. Lungs are clear. Degenerative changes in the shoulders. No acute osseous abnormalities. Atherosclerotic calcifications in the aorta.  IMPRESSION: No active cardiopulmonary disease.  Stable cardiomegaly.   Electronically Signed   By: Margaree Mackintosh M.D.   On: 01/01/2014 16:38   Ct Head Wo Contrast  01/01/2014   CLINICAL DATA:  LOSS OF CONSCIOUSNESS FALL NECK PAIN  EXAM: CT HEAD WITHOUT CONTRAST  CT CERVICAL SPINE WITHOUT CONTRAST  TECHNIQUE: Multidetector CT imaging of the head and cervical spine was performed following the standard protocol without intravenous contrast. Multiplanar CT image reconstructions of the cervical spine were also generated.  COMPARISON:  None.  Head CT 12/22/2011, 12/19/2011 head and C-spine, brain MRI dated 12/20/2011  FINDINGS: CT HEAD FINDINGS  No acute intracranial abnormality. Specifically, no hemorrhage, hydrocephalus, mass lesion, acute infarction, or significant intracranial injury. No acute calvarial abnormality. Age-appropriate global atrophy. Diffuse areas of low attenuation within the subcortical, deep, and periventricular white matter regions consistent with small vessel white matter ischaemia. Stable changes of chronic sinusitis within the paranasal sinuses. Mastoid air cells are patent.  CT CERVICAL SPINE FINDINGS  Type 2 dens fracture along the base of the odontoid. There is angulation posteriorly and to the right with left lateral and posterior displacement. Neurosurgical consultation and immobilization recommended. No further fracture nor dislocation. There does not appear to be appreciable canal stenosis.  Diffuse bridging osteophytes are appreciated throughout the cervical spine is extending to the upper thoracic spine. Differential considerations include changes secondary to ankylosing spondylitis verses DISH. There are degenerative disc disease changes within the lumbar spine with areas of vacuum  disc, endplate sclerosis. The disc spaces  are relatively maintained. Areas of facet sclerosis within the mid and lower cervical spine. There is no evidence of canal stenosis. The surrounding soft tissues appear unremarkable. There are areas of mild scarring within the lung apices.  IMPRESSION: Type 2 dens fracture along the base the odontoid. These results were called by telephone at the time of interpretation on 01/01/2014 at 5:54 PM to Dr. Ezequiel Essex , who verbally acknowledged these results.  Findings within the cervical spine may represent sequela of ankylosing spondylitis versus DISH  Multilevel spondylosis of the cervical spine  No acute intracranial abnormality.  Chronic an and lucent changes.   Electronically Signed   By: Margaree Mackintosh M.D.   On: 01/01/2014 17:58   Ct Cervical Spine Wo Contrast  01/01/2014   CLINICAL DATA:  LOSS OF CONSCIOUSNESS FALL NECK PAIN  EXAM: CT HEAD WITHOUT CONTRAST  CT CERVICAL SPINE WITHOUT CONTRAST  TECHNIQUE: Multidetector CT imaging of the head and cervical spine was performed following the standard protocol without intravenous contrast. Multiplanar CT image reconstructions of the cervical spine were also generated.  COMPARISON:  None.  Head CT 12/22/2011, 12/19/2011 head and C-spine, brain MRI dated 12/20/2011  FINDINGS: CT HEAD FINDINGS  No acute intracranial abnormality. Specifically, no hemorrhage, hydrocephalus, mass lesion, acute infarction, or significant intracranial injury. No acute calvarial abnormality. Age-appropriate global atrophy. Diffuse areas of low attenuation within the subcortical, deep, and periventricular white matter regions consistent with small vessel white matter ischaemia. Stable changes of chronic sinusitis within the paranasal sinuses. Mastoid air cells are patent.  CT CERVICAL SPINE FINDINGS  Type 2 dens fracture along the base of the odontoid. There is angulation posteriorly and to the right with left lateral and posterior displacement.  Neurosurgical consultation and immobilization recommended. No further fracture nor dislocation. There does not appear to be appreciable canal stenosis.  Diffuse bridging osteophytes are appreciated throughout the cervical spine is extending to the upper thoracic spine. Differential considerations include changes secondary to ankylosing spondylitis verses DISH. There are degenerative disc disease changes within the lumbar spine with areas of vacuum disc, endplate sclerosis. The disc spaces are relatively maintained. Areas of facet sclerosis within the mid and lower cervical spine. There is no evidence of canal stenosis. The surrounding soft tissues appear unremarkable. There are areas of mild scarring within the lung apices.  IMPRESSION: Type 2 dens fracture along the base the odontoid. These results were called by telephone at the time of interpretation on 01/01/2014 at 5:54 PM to Dr. Ezequiel Essex , who verbally acknowledged these results.  Findings within the cervical spine may represent sequela of ankylosing spondylitis versus DISH  Multilevel spondylosis of the cervical spine  No acute intracranial abnormality.  Chronic an and lucent changes.   Electronically Signed   By: Margaree Mackintosh M.D.   On: 01/01/2014 17:58    ASSESSMENT AND PLAN:    Active Problems:   Type II or unspecified type diabetes mellitus with renal manifestations, uncontrolled   PERIPHERAL NEUROPATHY   HYPERTENSION   AORTIC STENOSIS   CKD (chronic kidney disease) stage 3, GFR 30-59 ml/min   Dementia   Venous insufficiency   Hyperlipidemia with target LDL less than 100   Syncope   Cervical spine fracture  Shane [name] is a 80 y.o. male with a history of aortic stenosis, diastolic CHF, history of syncope, type 2 diabetes, stage 3-4 CKD who presented to [facility] ED last night with syncope. His syncope is most consistent with orthostatic hypotension.   Recurrent syncope  w/ trauma (C-Spine Fxr). Also has underlying aortic stenosis,  venous insufficiency and on alpha blocker for BPH.  -- Orthostatic hypotension: 147/67 (lying), 134/64 (stting), 126/81 (standing). Will allow permissive hypertension with orthostatic hypotension -- Consider discontinuing also blocker for BPH.  -- He will need PT to help him with balance and weakness   Bradycardia- he has a history of bradycardia. Telemetry yesterday reveals heart rates in the high 40s and 50s. Today he is maintaining heart rates in in the 60-70s.  -- TSH normal -- No indication for a pacemaker currently. Continue to monitor.  NSVT- 9 beats this morning. He reports no symptoms. Continue to monitor.  Hypertension- his blood pressure is not well controlled. Highest SBP 214. Currently 155/70 which is okay and probably where he needs to stay. Continue to monitor. -- Hydralazine 50mg  TID, nifidipine 30mg . Will discontinue Imdur as it can cause orthostatic hypotension.  Aortic stenosis. Repeat ECHO today. ECHO 12/2011 w/ mild aortic stenosis, mean gradient 13, AVA 1.39 cm  Mild ARF on CKD- Creatinine appears to be improving, baseline creatinine around 2.4.  -- Lasix held as he is being gently hydrated for AKI  Chronic diastolic CHF- ECHO 07/18/15: Mild LVH, EF 61-60%, grade 1 diastolic dysfunction, mild aortic stenosis, mean gradient 13, AVA 1.39 cm, mild MAC and nodular sclerosis of the tip of the anterior leaflet, mild MR, mild to moderate LAE, mild TR. BNP mildly elevated. CXR is clear. He denies shortness of breath -- He appears euvolemic on exam. Lasix being held currently as he is being gently hydrated for AKI -- Repeat echo pending  UTI- On Rocephin follow cultures.   C-spine fracture. Case was discussed by ER physician with neurosurgeon on call Dr. Saintclair Halsted, not an operative candidate due to advanced age, c-collar for at least 6 weeks with one month followup with neurosurgery in the office. No deficits.   Undiagnosed type 2 diabetes mellitus- elevated A1c. 9.3: Placed on  medication here.   SignedPerry Mount, PA-C 01/02/2014 11:52 AM  Pager [phone]  Co-Sign MD  Attending Note:   The patient was seen and examined.  Agree with assessment and plan as noted above.  Changes made to the above note as needed.  Shane [name] is a pleasant , elderly gentleman who is admitted after falling.  His symptoms are c/w orthostatic hypotension.  His HR was slow yesterday, but is ok now.  I would DC imdur.  Although all of his BP meds are known for causing orthostatic hypotension, Imdur is probably the most likely.  In addition, given his age, I would not try to push his BP any lower than ~150 or 160.  Following   Thayer Headings, Brooke Bonito., MD, [facility] 01/02/2014, 1:23 PM

## 2014-01-03 DIAGNOSIS — I369 Nonrheumatic tricuspid valve disorder, unspecified: Secondary | ICD-10-CM

## 2014-01-03 LAB — CBC WITH DIFFERENTIAL/PLATELET
BASOS PCT: 0 % (ref 0–1)
Basophils Absolute: 0 10*3/uL (ref 0.0–0.1)
Eosinophils Absolute: 0 10*3/uL (ref 0.0–0.7)
Eosinophils Relative: 0 % (ref 0–5)
HCT: 30.7 % — ABNORMAL LOW (ref 39.0–52.0)
HEMOGLOBIN: 10 g/dL — AB (ref 13.0–17.0)
LYMPHS PCT: 11 % — AB (ref 12–46)
Lymphs Abs: 0.6 10*3/uL — ABNORMAL LOW (ref 0.7–4.0)
MCH: 28.6 pg (ref 26.0–34.0)
MCHC: 32.6 g/dL (ref 30.0–36.0)
MCV: 87.7 fL (ref 78.0–100.0)
MONOS PCT: 10 % (ref 3–12)
Monocytes Absolute: 0.6 10*3/uL (ref 0.1–1.0)
NEUTROS ABS: 4.8 10*3/uL (ref 1.7–7.7)
NEUTROS PCT: 79 % — AB (ref 43–77)
Platelets: 174 10*3/uL (ref 150–400)
RBC: 3.5 MIL/uL — ABNORMAL LOW (ref 4.22–5.81)
RDW: 15.1 % (ref 11.5–15.5)
WBC: 6.1 10*3/uL (ref 4.0–10.5)

## 2014-01-03 LAB — BASIC METABOLIC PANEL
BUN: 35 mg/dL — AB (ref 6–23)
CHLORIDE: 102 meq/L (ref 96–112)
CO2: 24 meq/L (ref 19–32)
Calcium: 8.8 mg/dL (ref 8.4–10.5)
Creatinine, Ser: 2.14 mg/dL — ABNORMAL HIGH (ref 0.50–1.35)
GFR calc non Af Amer: 25 mL/min — ABNORMAL LOW (ref >90)
GFR, EST AFRICAN AMERICAN: 29 mL/min — AB (ref >90)
Glucose, Bld: 82 mg/dL (ref 70–99)
POTASSIUM: 4.9 meq/L (ref 3.7–5.3)
Sodium: 139 mEq/L (ref 137–147)

## 2014-01-03 LAB — GLUCOSE, CAPILLARY
GLUCOSE-CAPILLARY: 178 mg/dL — AB (ref 70–99)
GLUCOSE-CAPILLARY: 200 mg/dL — AB (ref 70–99)
Glucose-Capillary: 81 mg/dL (ref 70–99)
Glucose-Capillary: 126 mg/dL — ABNORMAL HIGH (ref 70–99)

## 2014-01-03 LAB — VITAMIN D 1,25 DIHYDROXY
Vitamin D 1, 25 (OH)2 Total: 39 pg/mL (ref 18–72)
Vitamin D2 1, 25 (OH)2: <8 pg/mL
Vitamin D3 1, 25 (OH)2: 39 pg/mL

## 2014-01-03 NOTE — Progress Notes (Signed)
Patient Demographics  Shane [name], is a 80 y.o. male, DOB - [birth year], OIT:[id number]  Admit date - 01/01/2014   Admitting Physician Shanda Howells, MD  Outpatient Primary MD for the patient is [name], MD  LOS - 2   Chief Complaint  Patient presents with  . Loss of Consciousness  . Fall  . Neck Pain           Subjective:   Shane [name] today has, No headache, No chest pain, No abdominal pain - No Nausea, No new weakness tingling or numbness, No Cough - SOB.      Assessment & Plan    1. Recurrent syncope with history of bradycardia in the past. Also has underlying mild aortic stenosis, venous insufficiency and on alpha blockers.    Seen by cardiology no further testing per cardiology or pacemaker required, stable TSH, is mildly orthostatic and will place on TED stockings, echo pending. Will require placement. Increase activity.   Echo    2. C-spine fracture. Case was discussed by ER physician with neurosurgeon on call Dr. Saintclair Halsted, not an operative candidate due to advanced age, c-collar for at least 6 weeks with one month followup with neurosurgery in the office. No deficits.    3. Elevated A1c. Undiagnosed type 2 diabetes mellitus. We will place on Lantus along with low-dose sliding scale and monitor. Home dose Glucotrol on hold.  Lab Results  Component Value Date   HGBA1C 9.0* 01/02/2014    CBG (last 3)   Recent Labs  01/02/14 1636 01/02/14 2127 01/03/14 0632  GLUCAP 323* 115* 81      4. Hypertension. Stable continue present regimen, not on any rate controlling medications.    5. BPH. On alpha blockers continue.    6. ? UTI. On Rocephin follow cultures.    7. History of aortic stenosis. Check echogram.    8. Mild ARF on Chronic kidney disease  stage IV. Creatinine appears to be improving, baseline creatinine around 2.4. Resolved and now close to baseline.    9.Small bleed at the Penile tip, no cute, hold Heparin, monitor. Seems to have resolved.     Code Status: Full  Family Communication: None present  Disposition Plan: May require SNF   Procedures CT head C-spine, echogram,   Consults Neurosurgeon Dr. Saintclair Halsted was consulted over the phone by ER physician, c-collar for 6-8 weeks and follow with him in the office poor operative candidate.  Consulted cardiology   Medications  Scheduled Meds: . aspirin EC  81 mg Oral Daily  . cefTRIAXone (ROCEPHIN)  IV  1 g Intravenous Q24H  . cyanocobalamin  500 mcg Oral Daily  . ferrous sulfate  325 mg Oral Daily  . finasteride  5 mg Oral Daily  . [START ON 01/04/2014] heparin  5,000 Units Subcutaneous 3 times per day  . hydrALAZINE  50 mg Oral TID  . insulin aspart  0-9 Units Subcutaneous TID WC  . insulin glargine  6 Units Subcutaneous Daily  . isosorbide mononitrate  15 mg Oral Daily  . NIFEdipine  30 mg Oral BID  . sodium chloride  3 mL Intravenous Q12H  . tamsulosin  0.4 mg Oral BID  . vitamin C  500 mg Oral Daily  Continuous Infusions:  PRN Meds:.HYDROcodone-acetaminophen, meclizine  DVT Prophylaxis   SCDs- Heparin held for 2 days  Lab Results  Component Value Date   PLT 174 01/03/2014    Antibiotics     Anti-infectives   Start     Dose/Rate Route Frequency Ordered Stop   01/01/14 2100  cefTRIAXone (ROCEPHIN) 1 g in dextrose 5 % 50 mL IVPB     1 g 100 mL/hr over 30 Minutes Intravenous Every 24 hours 01/01/14 2036     01/01/14 2000  cefTRIAXone (ROCEPHIN) 1 g in dextrose 5 % 50 mL IVPB  Status:  Discontinued     1 g 100 mL/hr over 30 Minutes Intravenous Every 24 hours 01/01/14 1955 01/01/14 2036          Objective:   Filed Vitals:   01/02/14 2117 01/03/14 0111 01/03/14 0500 01/03/14 0537  BP: 157/62 163/67  144/68  Pulse: 54 64  57  Temp: 97.4 F  (36.3 C) 97.2 F (36.2 C)  97.9 F (36.6 C)  TempSrc: Oral Oral  Oral  Resp:  18  18  Weight:   70.806 kg (156 lb 1.6 oz)   SpO2: 99% 99%  100%    Wt Readings from Last 3 Encounters:  01/03/14 70.806 kg (156 lb 1.6 oz)  10/09/13 70.67 kg (155 lb 12.8 oz)  09/27/13 69.673 kg (153 lb 9.6 oz)     Intake/Output Summary (Last 24 hours) at 01/03/14 0855 Last data filed at 01/02/14 2337  Gross per 24 hour  Intake    240 ml  Output    150 ml  Net     90 ml     Physical Exam  Awake Alert, Oriented X 3, No new F.N deficits, Normal affect .AT,PERRAL Wearing c-collar,No JVD, No cervical lymphadenopathy appriciated.  Symmetrical Chest wall movement, Good air movement bilaterally, CTAB RRR,No Gallops,Rubs , +ve systolic Murmur, No Parasternal Heave +ve B.Sounds, Abd Soft, No tenderness, No organomegaly appriciated, No rebound - guarding or rigidity. No Cyanosis, Clubbing or edema, No new Rash or bruise    Data Review   Micro Results No results found for this or any previous visit (from the past 240 hour(s)).  Radiology Reports Dg Chest 2 View  01/01/2014   CLINICAL DATA:  fall  EXAM: CHEST  2 VIEW  COMPARISON:  Portable chest radiograph 12/26/2011  FINDINGS: Stable cardiomegaly. Lungs are clear. Degenerative changes in the shoulders. No acute osseous abnormalities. Atherosclerotic calcifications in the aorta.  IMPRESSION: No active cardiopulmonary disease.  Stable cardiomegaly.   Electronically Signed   By: Margaree Mackintosh M.D.   On: 01/01/2014 16:38   Ct Head Wo Contrast  01/01/2014   CLINICAL DATA:  LOSS OF CONSCIOUSNESS FALL NECK PAIN  EXAM: CT HEAD WITHOUT CONTRAST  CT CERVICAL SPINE WITHOUT CONTRAST  TECHNIQUE: Multidetector CT imaging of the head and cervical spine was performed following the standard protocol without intravenous contrast. Multiplanar CT image reconstructions of the cervical spine were also generated.  COMPARISON:  None.  Head CT 12/22/2011, 12/19/2011 head  and C-spine, brain MRI dated 12/20/2011  FINDINGS: CT HEAD FINDINGS  No acute intracranial abnormality. Specifically, no hemorrhage, hydrocephalus, mass lesion, acute infarction, or significant intracranial injury. No acute calvarial abnormality. Age-appropriate global atrophy. Diffuse areas of low attenuation within the subcortical, deep, and periventricular white matter regions consistent with small vessel white matter ischaemia. Stable changes of chronic sinusitis within the paranasal sinuses. Mastoid air cells are patent.  CT CERVICAL SPINE FINDINGS  Type  2 dens fracture along the base of the odontoid. There is angulation posteriorly and to the right with left lateral and posterior displacement. Neurosurgical consultation and immobilization recommended. No further fracture nor dislocation. There does not appear to be appreciable canal stenosis.  Diffuse bridging osteophytes are appreciated throughout the cervical spine is extending to the upper thoracic spine. Differential considerations include changes secondary to ankylosing spondylitis verses DISH. There are degenerative disc disease changes within the lumbar spine with areas of vacuum disc, endplate sclerosis. The disc spaces are relatively maintained. Areas of facet sclerosis within the mid and lower cervical spine. There is no evidence of canal stenosis. The surrounding soft tissues appear unremarkable. There are areas of mild scarring within the lung apices.  IMPRESSION: Type 2 dens fracture along the base the odontoid. These results were called by telephone at the time of interpretation on 01/01/2014 at 5:54 PM to Dr. Ezequiel Essex , who verbally acknowledged these results.  Findings within the cervical spine may represent sequela of ankylosing spondylitis versus DISH  Multilevel spondylosis of the cervical spine  No acute intracranial abnormality.  Chronic an and lucent changes.   Electronically Signed   By: Margaree Mackintosh M.D.   On: 01/01/2014 17:58     Ct Cervical Spine Wo Contrast  01/01/2014   CLINICAL DATA:  LOSS OF CONSCIOUSNESS FALL NECK PAIN  EXAM: CT HEAD WITHOUT CONTRAST  CT CERVICAL SPINE WITHOUT CONTRAST  TECHNIQUE: Multidetector CT imaging of the head and cervical spine was performed following the standard protocol without intravenous contrast. Multiplanar CT image reconstructions of the cervical spine were also generated.  COMPARISON:  None.  Head CT 12/22/2011, 12/19/2011 head and C-spine, brain MRI dated 12/20/2011  FINDINGS: CT HEAD FINDINGS  No acute intracranial abnormality. Specifically, no hemorrhage, hydrocephalus, mass lesion, acute infarction, or significant intracranial injury. No acute calvarial abnormality. Age-appropriate global atrophy. Diffuse areas of low attenuation within the subcortical, deep, and periventricular white matter regions consistent with small vessel white matter ischaemia. Stable changes of chronic sinusitis within the paranasal sinuses. Mastoid air cells are patent.  CT CERVICAL SPINE FINDINGS  Type 2 dens fracture along the base of the odontoid. There is angulation posteriorly and to the right with left lateral and posterior displacement. Neurosurgical consultation and immobilization recommended. No further fracture nor dislocation. There does not appear to be appreciable canal stenosis.  Diffuse bridging osteophytes are appreciated throughout the cervical spine is extending to the upper thoracic spine. Differential considerations include changes secondary to ankylosing spondylitis verses DISH. There are degenerative disc disease changes within the lumbar spine with areas of vacuum disc, endplate sclerosis. The disc spaces are relatively maintained. Areas of facet sclerosis within the mid and lower cervical spine. There is no evidence of canal stenosis. The surrounding soft tissues appear unremarkable. There are areas of mild scarring within the lung apices.  IMPRESSION: Type 2 dens fracture along the base the  odontoid. These results were called by telephone at the time of interpretation on 01/01/2014 at 5:54 PM to Dr. Ezequiel Essex , who verbally acknowledged these results.  Findings within the cervical spine may represent sequela of ankylosing spondylitis versus DISH  Multilevel spondylosis of the cervical spine  No acute intracranial abnormality.  Chronic an and lucent changes.   Electronically Signed   By: Margaree Mackintosh M.D.   On: 01/01/2014 17:58    CBC  Recent Labs Lab 01/01/14 1520 01/01/14 2007 01/02/14 0418 01/03/14 0605  WBC 8.5 6.5 7.1 6.1  HGB 11.0*  10.9* 11.2* 10.0*  HCT 33.7* 33.8* 35.1* 30.7*  PLT 177 166 175 174  MCV 88.7 88.5 89.8 87.7  MCH 28.9 28.5 28.6 28.6  MCHC 32.6 32.2 31.9 32.6  RDW 15.3 15.5 15.4 15.1  LYMPHSABS  --   --  0.6* 0.6*  MONOABS  --   --  0.7 0.6  EOSABS  --   --  0.0 0.0  BASOSABS  --   --  0.0 0.0    Chemistries   Recent Labs Lab 01/01/14 1520 01/01/14 2007 01/02/14 0418 01/03/14 0605  NA 142  --  142 139  K 5.3  --  4.9 4.9  CL 103  --  101 102  CO2 23  --  27 24  GLUCOSE 47*  --  83 82  BUN 44*  --  39* 35*  CREATININE 2.60* 2.55* 2.41* 2.14*  CALCIUM 9.6  --  9.4 8.8  AST 49*  --  38*  --   ALT 32  --  28  --   ALKPHOS 80  --  77  --   BILITOT 0.4  --  0.3  --    ------------------------------------------------------------------------------------------------------------------ CrCl is unknown because both a height and weight (above a minimum accepted value) are required for this calculation. ------------------------------------------------------------------------------------------------------------------  Recent Labs  01/01/14 2007 01/02/14 1141  HGBA1C 9.1* 9.0*   ------------------------------------------------------------------------------------------------------------------ No results found for this basename: CHOL, HDL, LDLCALC, TRIG, CHOLHDL, LDLDIRECT,  in the last 72  hours ------------------------------------------------------------------------------------------------------------------  Recent Labs  01/01/14 2007  TSH 0.839   ------------------------------------------------------------------------------------------------------------------ No results found for this basename: VITAMINB12, FOLATE, FERRITIN, TIBC, IRON, RETICCTPCT,  in the last 72 hours  Coagulation profile  Recent Labs Lab 01/01/14 1520  INR 0.98    No results found for this basename: DDIMER,  in the last 72 hours  Cardiac Enzymes  Recent Labs Lab 01/02/14 1141 01/02/14 1832  TROPONINI <0.30 <0.30   ------------------------------------------------------------------------------------------------------------------ No components found with this basename: POCBNP,      Time Spent in minutes   35   SINGH,PRASHANT K M.D on 01/03/2014 at 8:55 AM  Between 7am to 7pm - Pager - [phone]  After 7pm go to [email or web address] - password [email or web address]  And look for the night coverage person covering for me after hours  [facility] Office  [phone]   **Disclaimer: This note may have been dictated with voice recognition software. Similar sounding words can inadvertently be transcribed and this note may contain transcription errors which may not have been corrected upon publication of note.**

## 2014-01-03 NOTE — Clinical Social Work Psychosocial (Signed)
Clinical Social Work Department BRIEF PSYCHOSOCIAL ASSESSMENT 01/03/2014  Patient:  Shane [name]     Account Number:  [id number]     Admit date:  01/01/2014  Clinical Social Worker:  Delrae Sawyers  Date/Time:  01/03/2014 02:35 PM  Referred by:  Physician  Date Referred:  01/03/2014 Referred for  SNF Placement   Other Referral:   none.   Interview type:  Family Other interview type:   CSW spoke with pt's wife, Shane [name] [phone]).    PSYCHOSOCIAL DATA Living Status:  WIFE Admitted from facility:   Level of care:   Primary support name:  Shane [name] Primary support relationship to patient:  SPOUSE Degree of support available:   Adequate support system.    CURRENT CONCERNS Current Concerns  Post-Acute Placement   Other Concerns:   none.    SOCIAL WORK ASSESSMENT / PLAN CSW received consult for SNF placement at time of discharge. CSW spoke with pt's wife, Shane, regarding discharge disposition. Per pt's wife, pt has previsouly received rehabilitation at [facility] and pt's wife would prefer for pt to complete rehabilitation there at time of discharge.    CSW to continue to follow and assist with discharge planning needs.   Assessment/plan status:  Psychosocial Support/Ongoing Assessment of Needs Other assessment/ plan:   none.   Information/referral to community resources:   [name] placement.    PATIENT'S/FAMILY'S RESPONSE TO PLAN OF CARE: Pt's wife understanding and agreeable to CSW plan of care. Pt's wife expressed no further questions or concerns at this time.       Shane [name], MSW, [facility] Licensed Clinical Social Worker [phone] and [phone]

## 2014-01-03 NOTE — Consult Note (Signed)
CARDIOLOGY CONSULT NOTE   Patient ID: Shane [name] MRN: [id number] DOB/AGE: 09-24-1916 80 y.o.  Admit date: 01/01/2014  Primary Physician   [name], MD Primary Cardiologist/Primary Electrophysiologist: Dr. Thompson Grayer  Reason for Consultation bradycardia and syncope  HPI: Shane [name] is a 80 y.o. male with a history of aortic stenosis, diastolic CHF, history of syncope, type 2 diabetes, stage 3-4 CKD who presented to [facility] ED last night with syncope.   Per report, patient has had recurrent episodes of syncope at home. Episodes seem to be predominantly when patient is standing up from sitting when he needs to use the bathroom. He states that he feels lightheaded and his legs get weak and he suddenly passes out. He has an associated C-spine fracture from one of his falls. Duration of falls is fairly nonspecific. Patient alludes to symptoms being present for at least the last year, but states that he's had multiple episodes this past week. Patient denies any nausea, palpitations, shortness of breath or chest pain prior to symptoms. States he feels generally weak. No hemiparesis or confusions. Does have some mild paresthesias. Denies any diarrhea or dysuria.   Echocardiogram 12/20/11: Mild LVH, EF 58-85%, grade 1 diastolic dysfunction, mild aortic stenosis, mean gradient 13, AVA 1.39 cm, mild MAC and nodular sclerosis of the tip of the anterior leaflet, mild MR, mild to moderate LAE, mild TR.    Past Medical History  Diagnosis Date  . Dental caries   . Hypertension   . Aortic stenosis     Echocardiogram 12/20/11: Mild LVH, EF 02-77%, grade 1 diastolic dysfunction, mild aortic stenosis, mean gradient 13, AVA 1.39 cm, mild MAC and nodular sclerosis of the tip of the anterior leaflet, mild MR, mild to moderate LAE, mild TR.  [name] Peripheral vascular disease   . Venous insufficiency   . Diabetes mellitus   . Diverticulosis of colon   . History of colonic polyps   . Renal  insufficiency   . Adenocarcinoma of prostate   . Chronic low back pain   . History of syncope   . Peripheral neuropathy   . Sebaceous cyst   . Anemia   . Hyperkalemia 12/19/2011  . Syncope      History reviewed. No pertinent past surgical history.  Allergies  Allergen Reactions  . Nifedipine     edema    I have reviewed the patient's current medications . aspirin EC  81 mg Oral Daily  . cefTRIAXone (ROCEPHIN)  IV  1 g Intravenous Q24H  . cyanocobalamin  500 mcg Oral Daily  . ferrous sulfate  325 mg Oral Daily  . finasteride  5 mg Oral Daily  . [START ON 01/04/2014] heparin  5,000 Units Subcutaneous 3 times per day  . hydrALAZINE  50 mg Oral TID  . insulin aspart  0-9 Units Subcutaneous TID WC  . insulin glargine  6 Units Subcutaneous Daily  . NIFEdipine  30 mg Oral BID  . sodium chloride  3 mL Intravenous Q12H  . vitamin C  500 mg Oral Daily     HYDROcodone-acetaminophen, meclizine  Prior to Admission medications   Medication Sig Start Date End Date Taking? Authorizing Provider  aspirin 81 MG tablet Take 81 mg by mouth daily.   Yes Historical Provider, MD  ferrous sulfate 325 (65 FE) MG tablet Take 325 mg by mouth daily.   Yes Historical Provider, MD  finasteride (PROSCAR) 5 MG tablet Take 5 mg by mouth daily.   Yes  Historical Provider, MD  furosemide (LASIX) 40 MG tablet Take 1 tablet (40 mg total) by mouth daily. 12/07/13  Yes Janith Lima, MD  glipiZIDE (GLUCOTROL) 5 MG tablet Take 5 mg by mouth daily.   Yes Historical Provider, MD  hydrALAZINE (APRESOLINE) 50 MG tablet Take 50 mg by mouth 3 (three) times daily.   Yes Historical Provider, MD  isosorbide mononitrate (IMDUR) 30 MG 24 hr tablet TAKE ONE TABLET BY MOUTH ONE TIME DAILY   Yes Noralee Space, MD  isosorbide mononitrate (IMDUR) 30 MG 24 hr tablet Take 30 mg by mouth daily.   Yes Historical Provider, MD  meclizine (ANTIVERT) 25 MG tablet Take 25 mg by mouth 3 (three) times daily as needed for dizziness.   Yes  Historical Provider, MD  Multiple Vitamins-Minerals (MENS MULTIVITAMIN PLUS PO) Take 1 tablet by mouth daily.   Yes Historical Provider, MD  NIFEdipine (PROCARDIA-XL/ADALAT-CC/NIFEDICAL-XL) 30 MG 24 hr tablet Take 30 mg by mouth 2 (two) times daily.   Yes Historical Provider, MD  predniSONE (DELTASONE) 10 MG tablet Take 10 mg by mouth daily.   Yes Historical Provider, MD  senna-docusate (SENOKOT-S) 8.6-50 MG per tablet Take 1 tablet by mouth daily as needed for moderate constipation.    Yes Historical Provider, MD  tamsulosin (FLOMAX) 0.4 MG CAPS capsule Take 0.4 mg by mouth 2 (two) times daily.  09/27/12  Yes Noralee Space, MD  vitamin B-12 (CYANOCOBALAMIN) 500 MCG tablet Take 500 mcg by mouth daily.     Yes Historical Provider, MD  vitamin C (ASCORBIC ACID) 500 MG tablet Take 500 mg by mouth daily.   Yes Historical Provider, MD     History   Social History  . Marital Status: Married    Spouse Name: N/A    Number of Children: 1  . Years of Education: N/A   Occupational History  . retired from [employer]  . Smoking status: Former Smoker    Types: Cigars    Quit date: 07/13/1978  . Smokeless tobacco: Not on file     Comment: for 2-3 years  . Alcohol Use: No  . Drug Use: No  . Sexual Activity: Not on file   Other Topics Concern  . Not on file   Social History Narrative  . No narrative on file    Family Status  Relation Status Death Age  . Mother      heart disease   Family History  Problem Relation Age of Onset  . Hypertension       ROS:  Full 14 point review of systems complete and found to be negative unless listed above.  Physical Exam: Blood pressure 144/68, pulse 57, temperature 97.9 F (36.6 C), temperature source Oral, resp. rate 18, weight 156 lb 1.6 oz (70.806 kg), SpO2 100.00%.  General: Well developed, well nourished, male in no acute distress. Elderly frail  Head: Eyes PERRLA, No xanthomas.   Normocephalic and atraumatic,  oropharynx without edema or exudate. Dentition:  Lungs: CTAB Heart: HRRR S1 S2, no rub/gallop, Heart irregular rate and rhythm with S1, S2  murmur. pulses are 2+ extrem.   Neck: No carotid bruits. No lymphadenopathy. No JVD. Abdomen: Bowel sounds present, abdomen soft and non-tender without masses or hernias noted. Msk:  No spine or cva tenderness. No weakness, no joint deformities or effusions. Extremities: No clubbing or cyanosis.  edema.  Neuro: Alert and oriented X 3. No focal deficits noted. Psych:  Good affect, responds appropriately Skin: No rashes or lesions noted.  Labs:   Lab Results  Component Value Date   WBC 6.1 01/03/2014   HGB 10.0* 01/03/2014   HCT 30.7* 01/03/2014   MCV 87.7 01/03/2014   PLT 174 01/03/2014    Recent Labs  01/01/14 1520  INR 0.98     Recent Labs Lab 01/02/14 0418 01/03/14 0605  NA 142 139  K 4.9 4.9  CL 101 102  CO2 27 24  BUN 39* 35*  CREATININE 2.41* 2.14*  CALCIUM 9.4 8.8  PROT 6.6  --   BILITOT 0.3  --   ALKPHOS 77  --   ALT 28  --   AST 38*  --   GLUCOSE 83 82  ALBUMIN 2.9*  --     Recent Labs  01/01/14 1532  TROPIPOC 0.03   Pro B Natriuretic peptide (BNP)  Date/Time Value Ref Range Status  01/01/2014  8:07 PM 1850.0* 0 - 450 pg/mL Final   Lab Results  Component Value Date   CHOL 186 12/07/2013   HDL 77.10 12/07/2013   LDLCALC 87 12/07/2013   TRIG 109.0 12/07/2013   Lipase  Date/Time Value Ref Range Status  01/01/2014  3:20 PM 24  11 - 59 U/L Final   TSH  Date/Time Value Ref Range Status  01/01/2014  8:07 PM 0.839  0.350 - 4.500 uIU/mL Final    Echo: Echocardiogram 12/20/11: Mild LVH, EF 60-63%, grade 1 diastolic dysfunction, mild aortic stenosis, mean gradient 13, AVA 1.39 cm, mild MAC and nodular sclerosis of the tip of the anterior leaflet, mild MR, mild to moderate LAE, mild TR.  ECG:  Sinus brady HR 57   Radiology:  Dg Chest 2 View  01/01/2014   CLINICAL DATA:  fall  EXAM: CHEST  2 VIEW  COMPARISON:  Portable  chest radiograph 12/26/2011  FINDINGS: Stable cardiomegaly. Lungs are clear. Degenerative changes in the shoulders. No acute osseous abnormalities. Atherosclerotic calcifications in the aorta.  IMPRESSION: No active cardiopulmonary disease.  Stable cardiomegaly.   Electronically Signed   By: Margaree Mackintosh M.D.   On: 01/01/2014 16:38   Ct Head Wo Contrast  01/01/2014   CLINICAL DATA:  LOSS OF CONSCIOUSNESS FALL NECK PAIN  EXAM: CT HEAD WITHOUT CONTRAST  CT CERVICAL SPINE WITHOUT CONTRAST  TECHNIQUE: Multidetector CT imaging of the head and cervical spine was performed following the standard protocol without intravenous contrast. Multiplanar CT image reconstructions of the cervical spine were also generated.  COMPARISON:  None.  Head CT 12/22/2011, 12/19/2011 head and C-spine, brain MRI dated 12/20/2011  FINDINGS: CT HEAD FINDINGS  No acute intracranial abnormality. Specifically, no hemorrhage, hydrocephalus, mass lesion, acute infarction, or significant intracranial injury. No acute calvarial abnormality. Age-appropriate global atrophy. Diffuse areas of low attenuation within the subcortical, deep, and periventricular white matter regions consistent with small vessel white matter ischaemia. Stable changes of chronic sinusitis within the paranasal sinuses. Mastoid air cells are patent.  CT CERVICAL SPINE FINDINGS  Type 2 dens fracture along the base of the odontoid. There is angulation posteriorly and to the right with left lateral and posterior displacement. Neurosurgical consultation and immobilization recommended. No further fracture nor dislocation. There does not appear to be appreciable canal stenosis.  Diffuse bridging osteophytes are appreciated throughout the cervical spine is extending to the upper thoracic spine. Differential considerations include changes secondary to ankylosing spondylitis verses DISH. There are degenerative disc disease changes within the lumbar spine with areas of vacuum disc,  endplate sclerosis. The disc spaces are relatively maintained. Areas of facet sclerosis within the mid and lower cervical spine. There is no evidence of canal stenosis. The surrounding soft tissues appear unremarkable. There are areas of mild scarring within the lung apices.  IMPRESSION: Type 2 dens fracture along the base the odontoid. These results were called by telephone at the time of interpretation on 01/01/2014 at 5:54 PM to Dr. Ezequiel Essex , who verbally acknowledged these results.  Findings within the cervical spine may represent sequela of ankylosing spondylitis versus DISH  Multilevel spondylosis of the cervical spine  No acute intracranial abnormality.  Chronic an and lucent changes.   Electronically Signed   By: Margaree Mackintosh M.D.   On: 01/01/2014 17:58   Ct Cervical Spine Wo Contrast  01/01/2014   CLINICAL DATA:  LOSS OF CONSCIOUSNESS FALL NECK PAIN  EXAM: CT HEAD WITHOUT CONTRAST  CT CERVICAL SPINE WITHOUT CONTRAST  TECHNIQUE: Multidetector CT imaging of the head and cervical spine was performed following the standard protocol without intravenous contrast. Multiplanar CT image reconstructions of the cervical spine were also generated.  COMPARISON:  None.  Head CT 12/22/2011, 12/19/2011 head and C-spine, brain MRI dated 12/20/2011  FINDINGS: CT HEAD FINDINGS  No acute intracranial abnormality. Specifically, no hemorrhage, hydrocephalus, mass lesion, acute infarction, or significant intracranial injury. No acute calvarial abnormality. Age-appropriate global atrophy. Diffuse areas of low attenuation within the subcortical, deep, and periventricular white matter regions consistent with small vessel white matter ischaemia. Stable changes of chronic sinusitis within the paranasal sinuses. Mastoid air cells are patent.  CT CERVICAL SPINE FINDINGS  Type 2 dens fracture along the base of the odontoid. There is angulation posteriorly and to the right with left lateral and posterior displacement.  Neurosurgical consultation and immobilization recommended. No further fracture nor dislocation. There does not appear to be appreciable canal stenosis.  Diffuse bridging osteophytes are appreciated throughout the cervical spine is extending to the upper thoracic spine. Differential considerations include changes secondary to ankylosing spondylitis verses DISH. There are degenerative disc disease changes within the lumbar spine with areas of vacuum disc, endplate sclerosis. The disc spaces are relatively maintained. Areas of facet sclerosis within the mid and lower cervical spine. There is no evidence of canal stenosis. The surrounding soft tissues appear unremarkable. There are areas of mild scarring within the lung apices.  IMPRESSION: Type 2 dens fracture along the base the odontoid. These results were called by telephone at the time of interpretation on 01/01/2014 at 5:54 PM to Dr. Ezequiel Essex , who verbally acknowledged these results.  Findings within the cervical spine may represent sequela of ankylosing spondylitis versus DISH  Multilevel spondylosis of the cervical spine  No acute intracranial abnormality.  Chronic an and lucent changes.   Electronically Signed   By: Margaree Mackintosh M.D.   On: 01/01/2014 17:58    ASSESSMENT AND PLAN:    Active Problems:   Type II or unspecified type diabetes mellitus with renal manifestations, uncontrolled   PERIPHERAL NEUROPATHY   HYPERTENSION   AORTIC STENOSIS   CKD (chronic kidney disease) stage 3, GFR 30-59 ml/min   Dementia   Venous insufficiency   Hyperlipidemia with target LDL less than 100   Syncope   Cervical spine fracture  Shane [name] is a 80 y.o. male with a history of aortic stenosis, diastolic CHF, history of syncope, type 2 diabetes, stage 3-4 CKD who presented to [facility] ED last night with syncope. His syncope is most consistent with orthostatic  hypotension.   Recurrent syncope w/ trauma (C-Spine Fxr). Also has underlying aortic stenosis,  venous insufficiency and on alpha blocker for BPH.  -- Orthostatic hypotension: 147/67 (lying), 134/64 (stting), 126/81 (standing). Will allow permissive hypertension with orthostatic hypotension -- He will need PT to help him with balance and weakness   His echo shows normal LV function.  Septal hypertrophy but no evidence of obstruction. I would hold him Imdur and see if he does better.  I would allow him to have a SBP of 150-160 if that keeps him from having syncope.   Bradycardia- he has a history of bradycardia. Telemetry yesterday reveals heart rates in the high 40s and 50s. Today he is maintaining heart rates in in the 60-70s.  -- TSH normal -- No indication for a pacemaker currently. Continue to monitor.  NSVT- 9 beats this morning. He reports no symptoms. Continue to monitor.  Hypertension- his blood pressure is not well controlled. Highest SBP 214. Currently 155/70 which is okay and probably where he needs to stay. Continue to monitor. -- Hydralazine 50mg  TID, nifidipine 30mg . Will discontinue Imdur as it can cause orthostatic hypotension.  Aortic stenosis. Repeat ECHO today. ECHO 12/2011 w/ mild aortic stenosis, mean gradient 13, AVA 1.39 cm   Chronic diastolic CHF- ECHO 09/17/07: Mild LVH, EF 64-38%, grade 1 diastolic dysfunction, mild aortic stenosis, mean gradient 13, AVA 1.39 cm, mild MAC and nodular sclerosis of the tip of the anterior leaflet, mild MR, mild to moderate LAE, mild TR. BNP mildly elevated. CXR is clear. He denies shortness of breath -- He appears euvolemic on exam. Lasix being held currently as he is being gently hydrated for AKI.  It will be important to keep him from being dehydrated as this will likely cause more orthostatic hypotension.    UTI- On Rocephin follow cultures.   Undiagnosed type 2 diabetes mellitus- elevated A1c. 9.3: Placed on medication here.    Will sign off. Call for questions.   Thayer Headings, Brooke Bonito., MD, [facility] 01/03/2014, 1:27  PM [address],  [place] Pager [phone]

## 2014-01-03 NOTE — Progress Notes (Signed)
  Echocardiogram 2D Echocardiogram has been performed.  [name], Shane 01/03/2014, 10:35 AM

## 2014-01-03 NOTE — Clinical Social Work Placement (Addendum)
Clinical Social Work Department CLINICAL SOCIAL WORK PLACEMENT NOTE 01/03/2014  Patient:  Shane [name]  Account Number:  [id number] Admit date:  01/01/2014  Clinical Social Worker:  Delrae Sawyers  Date/time:  01/03/2014 02:44 PM  Clinical Social Work is seeking post-discharge placement for this patient at the following level of care:   [place]   (*CSW will update this form in Epic as items are completed)   01/03/2014  Patient/family provided with [place] Department of Clinical Social Work's list of facilities offering this level of care within the geographic area requested by the patient (or if unable, by the patient's family).  01/03/2014  Patient/family informed of their freedom to choose among providers that offer the needed level of care, that participate in Medicare, Medicaid or managed care program needed by the patient, have an available bed and are willing to accept the patient.  01/03/2014  Patient/family informed of [facility]' ownership interest in [name], as well as of the fact that they are under no obligation to receive care at this facility.  PASARR submitted to EDS on  PASARR number received on   FL2 transmitted to all facilities in geographic area requested by pt/family on  01/03/2014 FL2 transmitted to all facilities within larger geographic area on   Patient informed that his/her managed care company has contracts with or will negotiate with  certain facilities, including the following:     Patient/family informed of bed offers received:  01/04/2014 Patient chooses bed at [facility] Physician recommends and patient chooses bed at    Patient to be transferred to [name] on  01/04/2014 Patient to be transferred to facility by [facility] Patient and family notified of transfer on 01/04/2014 Name of family member notified:  Ether Griffins (pt's wife)  The following physician request were entered in Epic:   Additional  Comments: Pt's PASARR previously existing.  [name], MSW, [facility] Licensed Clinical Social Worker [phone] and [phone]

## 2014-01-04 LAB — URINE CULTURE: Colony Count: >=100000

## 2014-01-04 LAB — GLUCOSE, CAPILLARY
GLUCOSE-CAPILLARY: 112 mg/dL — AB (ref 70–99)
Glucose-Capillary: 130 mg/dL — ABNORMAL HIGH (ref 70–99)
Glucose-Capillary: 182 mg/dL — ABNORMAL HIGH (ref 70–99)

## 2014-01-04 LAB — CBC WITH DIFFERENTIAL/PLATELET
Basophils Absolute: 0 10*3/uL (ref 0.0–0.1)
Basophils Relative: 0 % (ref 0–1)
Eosinophils Absolute: 0 10*3/uL (ref 0.0–0.7)
Eosinophils Relative: 0 % (ref 0–5)
HCT: 37.2 % — ABNORMAL LOW (ref 39.0–52.0)
HEMOGLOBIN: 12 g/dL — AB (ref 13.0–17.0)
LYMPHS ABS: 0.9 10*3/uL (ref 0.7–4.0)
Lymphocytes Relative: 14 % (ref 12–46)
MCH: 29 pg (ref 26.0–34.0)
MCHC: 32.3 g/dL (ref 30.0–36.0)
MCV: 89.9 fL (ref 78.0–100.0)
MONOS PCT: 12 % (ref 3–12)
Monocytes Absolute: 0.8 10*3/uL (ref 0.1–1.0)
NEUTROS ABS: 4.6 10*3/uL (ref 1.7–7.7)
Neutrophils Relative %: 74 % (ref 43–77)
Platelets: 196 10*3/uL (ref 150–400)
RBC: 4.14 MIL/uL — AB (ref 4.22–5.81)
RDW: 15.1 % (ref 11.5–15.5)
WBC: 6.3 10*3/uL (ref 4.0–10.5)

## 2014-01-04 MED ORDER — INSULIN GLARGINE 100 UNIT/ML ~~LOC~~ SOLN
6.0000 [IU] | Freq: Every day | SUBCUTANEOUS | Status: DC
Start: 2014-01-04 — End: 2014-06-09

## 2014-01-04 MED ORDER — FUROSEMIDE 20 MG PO TABS: 20.0000 mg | ORAL_TABLET | Freq: Every day | ORAL | Status: DC

## 2014-01-04 MED ORDER — LORAZEPAM 2 MG/ML IJ SOLN
0.2500 mg | Freq: Once | INTRAMUSCULAR | Status: AC
  Administered 2014-01-04: 0.25 mg via INTRAVENOUS
  Filled 2014-01-04: qty 1

## 2014-01-04 MED ORDER — FOSFOMYCIN TROMETHAMINE 3 G PO PACK
3.0000 g | PACK | Freq: Once | ORAL | Status: AC
  Administered 2014-01-04: 3 g via ORAL
  Filled 2014-01-04: qty 3

## 2014-01-04 MED ORDER — INSULIN ASPART 100 UNIT/ML ~~LOC~~ SOLN: SUBCUTANEOUS | Status: DC

## 2014-01-04 NOTE — Progress Notes (Signed)
Discharge orders received.  Pt stable upon discharge.  Pt transported by [facility] to [facility].  Cori Razor, RN 01/04/2014 1245 pm

## 2014-01-04 NOTE — Clinical Social Work Note (Signed)
CSW has faxed discharge summary to [facility]. Discharge packet has been completed and placed on pt's shadow chart. CSW spoke with pt's wife, Shane [name], regarding pt's discharge disposition. Pt's wife understanding and agreeable to discharge to [facility] on 01/04/2014. Pt's wife stated she will be meeting pt at [facility]. RN notified of information above and provided with number for report. CSW arranged for transportation via EMS ([facility]).  [name], MSW, [facility] Licensed Clinical Social Worker [phone] and [phone]

## 2014-01-04 NOTE — Discharge Instructions (Signed)
Follow with Primary MD [name], MD in 7 days , C Collar on at all times for 2 months then per neuro Surgery.   Get CBC, CMP by Primary MD next visit.    Activity: As tolerated with Full fall precautions use walker/cane & assistance as needed   Disposition SNF   Diet: Heart Healthy - Low carb with feeding assistance and aspiration precautions .  For Heart failure patients - Check your Weight same time everyday, if you gain over 2 pounds, or you develop in leg swelling, experience more shortness of breath or chest pain, call your Primary MD immediately. Follow Cardiac Low Salt Diet and 1.8 lit/day fluid restriction.   On your next visit with her primary care physician please Get Medicines reviewed and adjusted.  Please request your Prim.MD to go over all Hospital Tests and Procedure/Radiological results at the follow up, please get all Hospital records sent to your Prim MD by signing hospital release before you go home.   If you experience worsening of your admission symptoms, develop shortness of breath, life threatening emergency, suicidal or homicidal thoughts you must seek medical attention immediately by calling 911 or calling your MD immediately  if symptoms less severe.  You Must read complete instructions/literature along with all the possible adverse reactions/side effects for all the Medicines you take and that have been prescribed to you. Take any new Medicines after you have completely understood and accpet all the possible adverse reactions/side effects.   Do not drive and provide baby sitting services if your were admitted for syncope or siezures until you have seen by Primary MD or a Neurologist and advised to do so again.  Do not drive when taking Pain medications.    Do not take more than prescribed Pain, Sleep and Anxiety Medications  Special Instructions: If you have smoked or chewed Tobacco  in the last 2 yrs please stop smoking, stop any regular Alcohol  and  or any Recreational drug use.  Wear Seat belts while driving.   Please note  You were cared for by a hospitalist during your hospital stay. If you have any questions about your discharge medications or the care you received while you were in the hospital after you are discharged, you can call the unit and asked to speak with the hospitalist on call if the hospitalist that took care of you is not available. Once you are discharged, your primary care physician will handle any further medical issues. Please note that NO REFILLS for any discharge medications will be authorized once you are discharged, as it is imperative that you return to your primary care physician (or establish a relationship with a primary care physician if you do not have one) for your aftercare needs so that they can reassess your need for medications and monitor your lab values.

## 2014-01-04 NOTE — Discharge Summary (Signed)
Shane [name], is a 80 y.o. male  DOB [birth year]  MRN [id number].  Admission date:  01/01/2014  Admitting Physician  Shanda Howells, MD  Discharge Date:  01/04/2014   Primary MD  [name], MD  Recommendations for primary care physician for things to follow:    C collar on at all times for 8 weeks then per Dr Saintclair Halsted  Monitor glycemic control closely adjust medications as needed   Admission Diagnosis  Aortic stenosis [424.1] Dens fracture, closed, initial encounter [805.02] Syncope, unspecified syncope type [780.2]   Discharge Diagnosis  Aortic stenosis [424.1] Dens fracture, closed, initial encounter [805.02] Syncope, unspecified syncope type [780.2]    Active Problems:   Type II or unspecified type diabetes mellitus with renal manifestations, uncontrolled   PERIPHERAL NEUROPATHY   HYPERTENSION   AORTIC STENOSIS   CKD (chronic kidney disease) stage 3, GFR 30-59 ml/min   Dementia   Venous insufficiency   Hyperlipidemia with target LDL less than 100   Syncope   Cervical spine fracture      Past Medical History  Diagnosis Date  . Dental caries   . Hypertension   . Aortic stenosis     Echocardiogram 12/20/11: Mild LVH, EF 40-98%, grade 1 diastolic dysfunction, mild aortic stenosis, mean gradient 13, AVA 1.39 cm, mild MAC and nodular sclerosis of the tip of the anterior leaflet, mild MR, mild to moderate LAE, mild TR.  [name] Peripheral vascular disease   . Venous insufficiency   . Diabetes mellitus   . Diverticulosis of colon   . History of colonic polyps   . Renal insufficiency   . Adenocarcinoma of prostate   . Chronic low back pain   . History of syncope   . Peripheral neuropathy   . Sebaceous cyst   . Anemia   . Hyperkalemia 12/19/2011  . Syncope     History reviewed. No pertinent past surgical  history.   Discharge Condition: Stable   Follow UP  Follow-up Information   Follow up with [name], MD. Schedule an appointment as soon as possible for a visit in 1 week.   Specialty:  Internal Medicine   Contact information:   [address] [address] [phone]       Follow up with CRAM,GARY P, MD. Schedule an appointment as soon as possible for a visit in 4 weeks.   Specialty:  Neurosurgery   Contact information:   [address] [place] [address] [phone]         Discharge Instructions  and  Discharge Medications     Discharge Instructions   Discharge instructions    Complete by:  As directed   Follow with Primary MD [name], MD in 7 days , C Collar on at all times for 2 months then per neuro Surgery.   Get CBC, CMP by Primary MD next visit.    Activity: As tolerated with Full fall precautions use walker/cane & assistance as needed   Disposition SNF  Diet: Heart Healthy - Low carb with feeding assistance and aspiration precautions .  For Heart failure patients - Check your Weight same time everyday, if you gain over 2 pounds, or you develop in leg swelling, experience more shortness of breath or chest pain, call your Primary MD immediately. Follow Cardiac Low Salt Diet and 1.8 lit/day fluid restriction.   On your next visit with her primary care physician please Get Medicines reviewed and adjusted.  Please request your Prim.MD to go over all Hospital Tests and Procedure/Radiological results at the follow up, please get all Hospital records sent to your Prim MD by signing hospital release before you go home.   If you experience worsening of your admission symptoms, develop shortness of breath, life threatening emergency, suicidal or homicidal thoughts you must seek medical attention immediately by calling 911 or calling your MD immediately  if symptoms less severe.  You Must read complete  instructions/literature along with all the possible adverse reactions/side effects for all the Medicines you take and that have been prescribed to you. Take any new Medicines after you have completely understood and accpet all the possible adverse reactions/side effects.   Do not drive and provide baby sitting services if your were admitted for syncope or siezures until you have seen by Primary MD or a Neurologist and advised to do so again.  Do not drive when taking Pain medications.    Do not take more than prescribed Pain, Sleep and Anxiety Medications  Special Instructions: If you have smoked or chewed Tobacco  in the last 2 yrs please stop smoking, stop any regular Alcohol  and or any Recreational drug use.  Wear Seat belts while driving.   Please note  You were cared for by a hospitalist during your hospital stay. If you have any questions about your discharge medications or the care you received while you were in the hospital after you are discharged, you can call the unit and asked to speak with the hospitalist on call if the hospitalist that took care of you is not available. Once you are discharged, your primary care physician will handle any further medical issues. Please note that NO REFILLS for any discharge medications will be authorized once you are discharged, as it is imperative that you return to your primary care physician (or establish a relationship with a primary care physician if you do not have one) for your aftercare needs so that they can reassess your need for medications and monitor your lab values.  Follow with Primary MD [name], MD in 7 days , C Collar on at all times for 2 months then per neuro Surgery.   Get CBC, CMP by Primary MD next visit.    Activity: As tolerated with Full fall precautions use walker/cane & assistance as needed   Disposition SNF   Diet: Heart Healthy - Low carb with feeding assistance and aspiration precautions .  For Heart  failure patients - Check your Weight same time everyday, if you gain over 2 pounds, or you develop in leg swelling, experience more shortness of breath or chest pain, call your Primary MD immediately. Follow Cardiac Low Salt Diet and 1.8 lit/day fluid restriction.   On your next visit with her primary care physician please Get Medicines reviewed and adjusted.  Please request your Prim.MD to go over all Hospital Tests and Procedure/Radiological results at the follow up, please get all Hospital records sent to your Prim MD by signing hospital release before you go  home.   If you experience worsening of your admission symptoms, develop shortness of breath, life threatening emergency, suicidal or homicidal thoughts you must seek medical attention immediately by calling 911 or calling your MD immediately  if symptoms less severe.  You Must read complete instructions/literature along with all the possible adverse reactions/side effects for all the Medicines you take and that have been prescribed to you. Take any new Medicines after you have completely understood and accpet all the possible adverse reactions/side effects.   Do not drive and provide baby sitting services if your were admitted for syncope or siezures until you have seen by Primary MD or a Neurologist and advised to do so again.  Do not drive when taking Pain medications.    Do not take more than prescribed Pain, Sleep and Anxiety Medications  Special Instructions: If you have smoked or chewed Tobacco  in the last 2 yrs please stop smoking, stop any regular Alcohol  and or any Recreational drug use.  Wear Seat belts while driving.   Please note  You were cared for by a hospitalist during your hospital stay. If you have any questions about your discharge medications or the care you received while you were in the hospital after you are discharged, you can call the unit and asked to speak with the hospitalist on call if the hospitalist  that took care of you is not available. Once you are discharged, your primary care physician will handle any further medical issues. Please note that NO REFILLS for any discharge medications will be authorized once you are discharged, as it is imperative that you return to your primary care physician (or establish a relationship with a primary care physician if you do not have one) for your aftercare needs so that they can reassess your need for medications and monitor your lab values.     Increase activity slowly    Complete by:  As directed             Medication List    STOP taking these medications       glipiZIDE 5 MG tablet  Commonly known as:  GLUCOTROL      TAKE these medications       aspirin 81 MG tablet  Take 81 mg by mouth daily.     ferrous sulfate 325 (65 FE) MG tablet  Take 325 mg by mouth daily.     finasteride 5 MG tablet  Commonly known as:  PROSCAR  Take 5 mg by mouth daily.     furosemide 40 MG tablet  Commonly known as:  LASIX  Take 1 tablet (40 mg total) by mouth daily.     hydrALAZINE 50 MG tablet  Commonly known as:  APRESOLINE  Take 50 mg by mouth 3 (three) times daily.     insulin aspart 100 UNIT/ML injection  Commonly known as:  novoLOG  - Before each meal 3 times a day, 140-199 - 2 units, 200-250 - 4 units, 251-299 - 6 units,  300-349 - 8 units,  350 or above 10 units.  - Insulin PEN if approved, provide syringes and needles if needed.     insulin glargine 100 UNIT/ML injection  Commonly known as:  LANTUS  Inject 0.06 mLs (6 Units total) into the skin daily.     isosorbide mononitrate 30 MG 24 hr tablet  Commonly known as:  IMDUR  Take 30 mg by mouth daily.     isosorbide mononitrate 30 MG 24  hr tablet  Commonly known as:  IMDUR  TAKE ONE TABLET BY MOUTH ONE TIME DAILY     meclizine 25 MG tablet  Commonly known as:  ANTIVERT  Take 25 mg by mouth 3 (three) times daily as needed for dizziness.     MENS MULTIVITAMIN PLUS PO  Take 1  tablet by mouth daily.     NIFEdipine 30 MG 24 hr tablet  Commonly known as:  PROCARDIA-XL/ADALAT-CC/NIFEDICAL-XL  Take 30 mg by mouth 2 (two) times daily.     predniSONE 10 MG tablet  Commonly known as:  DELTASONE  Take 10 mg by mouth daily.     senna-docusate 8.6-50 MG per tablet  Commonly known as:  Senokot-S  Take 1 tablet by mouth daily as needed for moderate constipation.     tamsulosin 0.4 MG Caps capsule  Commonly known as:  FLOMAX  Take 0.4 mg by mouth 2 (two) times daily.     vitamin B-12 500 MCG tablet  Commonly known as:  CYANOCOBALAMIN  Take 500 mcg by mouth daily.     vitamin C 500 MG tablet  Commonly known as:  ASCORBIC ACID  Take 500 mg by mouth daily.          Diet and Activity recommendation: See Discharge Instructions above   Consults obtained - N surg Dr Wynona Neat over the phone, cards   Major procedures and Radiology Reports - PLEASE review detailed and final reports for all details, in brief -    Echo Impressions:  - LVEF 20-25%, diastolic dysfunction, elevated LV filling pressure, mild to moderate aortic stenosis. Compared to echo in 2013, the degree of aortic stenosis may be slightly worse.    Dg Chest 2 View  01/01/2014   CLINICAL DATA:  fall  EXAM: CHEST  2 VIEW  COMPARISON:  Portable chest radiograph 12/26/2011  FINDINGS: Stable cardiomegaly. Lungs are clear. Degenerative changes in the shoulders. No acute osseous abnormalities. Atherosclerotic calcifications in the aorta.  IMPRESSION: No active cardiopulmonary disease.  Stable cardiomegaly.   Electronically Signed   By: Margaree Mackintosh M.D.   On: 01/01/2014 16:38   Ct Head Wo Contrast  01/01/2014   CLINICAL DATA:  LOSS OF CONSCIOUSNESS FALL NECK PAIN  EXAM: CT HEAD WITHOUT CONTRAST  CT CERVICAL SPINE WITHOUT CONTRAST  TECHNIQUE: Multidetector CT imaging of the head and cervical spine was performed following the standard protocol without intravenous contrast. Multiplanar CT image  reconstructions of the cervical spine were also generated.  COMPARISON:  None.  Head CT 12/22/2011, 12/19/2011 head and C-spine, brain MRI dated 12/20/2011  FINDINGS: CT HEAD FINDINGS  No acute intracranial abnormality. Specifically, no hemorrhage, hydrocephalus, mass lesion, acute infarction, or significant intracranial injury. No acute calvarial abnormality. Age-appropriate global atrophy. Diffuse areas of low attenuation within the subcortical, deep, and periventricular white matter regions consistent with small vessel white matter ischaemia. Stable changes of chronic sinusitis within the paranasal sinuses. Mastoid air cells are patent.  CT CERVICAL SPINE FINDINGS  Type 2 dens fracture along the base of the odontoid. There is angulation posteriorly and to the right with left lateral and posterior displacement. Neurosurgical consultation and immobilization recommended. No further fracture nor dislocation. There does not appear to be appreciable canal stenosis.  Diffuse bridging osteophytes are appreciated throughout the cervical spine is extending to the upper thoracic spine. Differential considerations include changes secondary to ankylosing spondylitis verses DISH. There are degenerative disc disease changes within the lumbar spine with areas of vacuum disc, endplate sclerosis. The  disc spaces are relatively maintained. Areas of facet sclerosis within the mid and lower cervical spine. There is no evidence of canal stenosis. The surrounding soft tissues appear unremarkable. There are areas of mild scarring within the lung apices.  IMPRESSION: Type 2 dens fracture along the base the odontoid. These results were called by telephone at the time of interpretation on 01/01/2014 at 5:54 PM to Dr. Ezequiel Essex , who verbally acknowledged these results.  Findings within the cervical spine may represent sequela of ankylosing spondylitis versus DISH  Multilevel spondylosis of the cervical spine  No acute intracranial  abnormality.  Chronic an and lucent changes.   Electronically Signed   By: Margaree Mackintosh M.D.   On: 01/01/2014 17:58   Ct Cervical Spine Wo Contrast  01/01/2014   CLINICAL DATA:  LOSS OF CONSCIOUSNESS FALL NECK PAIN  EXAM: CT HEAD WITHOUT CONTRAST  CT CERVICAL SPINE WITHOUT CONTRAST  TECHNIQUE: Multidetector CT imaging of the head and cervical spine was performed following the standard protocol without intravenous contrast. Multiplanar CT image reconstructions of the cervical spine were also generated.  COMPARISON:  None.  Head CT 12/22/2011, 12/19/2011 head and C-spine, brain MRI dated 12/20/2011  FINDINGS: CT HEAD FINDINGS  No acute intracranial abnormality. Specifically, no hemorrhage, hydrocephalus, mass lesion, acute infarction, or significant intracranial injury. No acute calvarial abnormality. Age-appropriate global atrophy. Diffuse areas of low attenuation within the subcortical, deep, and periventricular white matter regions consistent with small vessel white matter ischaemia. Stable changes of chronic sinusitis within the paranasal sinuses. Mastoid air cells are patent.  CT CERVICAL SPINE FINDINGS  Type 2 dens fracture along the base of the odontoid. There is angulation posteriorly and to the right with left lateral and posterior displacement. Neurosurgical consultation and immobilization recommended. No further fracture nor dislocation. There does not appear to be appreciable canal stenosis.  Diffuse bridging osteophytes are appreciated throughout the cervical spine is extending to the upper thoracic spine. Differential considerations include changes secondary to ankylosing spondylitis verses DISH. There are degenerative disc disease changes within the lumbar spine with areas of vacuum disc, endplate sclerosis. The disc spaces are relatively maintained. Areas of facet sclerosis within the mid and lower cervical spine. There is no evidence of canal stenosis. The surrounding soft tissues appear  unremarkable. There are areas of mild scarring within the lung apices.  IMPRESSION: Type 2 dens fracture along the base the odontoid. These results were called by telephone at the time of interpretation on 01/01/2014 at 5:54 PM to Dr. Ezequiel Essex , who verbally acknowledged these results.  Findings within the cervical spine may represent sequela of ankylosing spondylitis versus DISH  Multilevel spondylosis of the cervical spine  No acute intracranial abnormality.  Chronic an and lucent changes.   Electronically Signed   By: Margaree Mackintosh M.D.   On: 01/01/2014 17:58    Micro Results     Recent Results (from the past 240 hour(s))  URINE CULTURE     Status: None   Collection Time    01/02/14 12:34 AM      Result Value Ref Range Status   Specimen Description URINE, RANDOM   Final   Special Requests NONE   Final   Culture  Setup Time     Final   Value: 01/02/2014 09:51     Performed at [facility]     Final   Value: >=100,000 COLONIES/ML     Performed at [facility]  Final   Value: ENTEROCOCCUS SPECIES     Performed at [facility]   Report Status 01/04/2014 FINAL   Final   Organism ID, Bacteria ENTEROCOCCUS SPECIES   Final     History of present illness and  Hospital Course:     Kindly see H&P for history of present illness and admission details, please review complete Labs, Consult reports and Test reports for all details in brief Shane [name], is a 80 y.o. male, patient with history of  mild to moderate diverticulosis, sinus bradycardia, possible early dementia, chronic diastolic CHF EF 31%, type 2 diabetes mellitus, stage III chronic kidney disease was admitted to the hospital after multiple syncopal episodes, after the last fall he sustained neck injury and was found to have a C-spine fracture. We were called to admit the patient.    1. Recurrent syncope with history of bradycardia in the past. Also has underlying mild  aortic stenosis, venous insufficiency and on alpha blockers, mildly orthostatic.  Seen by cardiology no further testing per cardiology no pacemaker required, stable TSH, is mildly orthostatic and will place on TED stockings, cannot discontinue alpha blockers for BPH as he has severe BPH underlying, please apply TED stockings at all times when he is out of bed, fall precautions and assistance when he is out of bed, high-risk candidate for fall. Will require SNF placement short-term versus permanent. Have reduced Lasix dose to see if it helps with orthostatics.   Echo shows mild to moderate aortic stenosis EF 60% with LVH.       2. C-spine fracture. Case was discussed by ER physician with neurosurgeon on call Dr. Saintclair Halsted, not an operative candidate due to advanced age, c-collar for at least 17 weeks with one month followup with neurosurgery in the office. No deficits.     3. DM type II. In poor control. Placed on low-dose Lantus along with sliding scale, Glucotrol dose has been lowered a little bit. Monitor CBGs closely and adjust medications as needed will request to do Accu-Cheks q. a.c. HSP  Lab Results  Component Value Date   HGBA1C 9.0* 01/02/2014      4. Hypertension. Stable continue present regimen, apply TED stockings whenever out of bed. Full fall precautions.   5. BPH. On alpha blockers continue, cannot hold it despite orthostatics being positive as her severe underlying BPH.    6. UTI. Given a dose of fosfomycin.    7. History of mild to moderate aortic stenosis. Outpatient cardiology followup.   8. Mild ARF on Chronic kidney disease stage IV. Creatinine appears to be improving, baseline creatinine around 2.4. Resolved and now close to baseline. We'll resume Lasix at lower than home dose. Monitor BMP closely.   9.Small bleed at the Penile tip - resolved, monitor closely, if bleeding reoccurs urology followup.     Today   Subjective:   Shane [name] today has no  headache,no chest abdominal pain,no new weakness tingling or numbness, feels much better.  Objective:   Blood pressure 141/72, pulse 68, temperature 98.6 F (37 C), temperature source Oral, resp. rate 18, weight 69.31 kg (152 lb 12.8 oz), SpO2 95.00%.   Intake/Output Summary (Last 24 hours) at 01/04/14 1018 Last data filed at 01/04/14 0500  Gross per 24 hour  Intake      0 ml  Output   1150 ml  Net  -1150 ml    Exam Awake Alert, Oriented x 1, No new F.N deficits, Normal affect [place].AT,PERRAL C collar in  palce Symmetrical Chest wall movement, Good air movement bilaterally, CTAB RRR,No Gallops,Rubs , positive systolic murmur, No Parasternal Heave +ve B.Sounds, Abd Soft, Non tender, No organomegaly appriciated, No rebound -guarding or rigidity. No Cyanosis, Clubbing or edema, No new Rash or bruise    Data Review   CBC w Diff: Lab Results  Component Value Date   WBC 6.3 01/04/2014   HGB 12.0* 01/04/2014   HCT 37.2* 01/04/2014   PLT 196 01/04/2014   LYMPHOPCT 14 01/04/2014   MONOPCT 12 01/04/2014   EOSPCT 0 01/04/2014   BASOPCT 0 01/04/2014    CMP: Lab Results  Component Value Date   NA 139 01/03/2014   K 4.9 01/03/2014   CL 102 01/03/2014   CO2 24 01/03/2014   BUN 35* 01/03/2014   CREATININE 2.14* 01/03/2014   PROT 6.6 01/02/2014   ALBUMIN 2.9* 01/02/2014   BILITOT 0.3 01/02/2014   ALKPHOS 77 01/02/2014   AST 38* 01/02/2014   ALT 28 01/02/2014  .   Total Time in preparing paper work, data evaluation and todays exam - 35 minutes  Thurnell Lose M.D on 01/04/2014 at 10:18 AM  [facility] Office  [phone]   **Disclaimer: This note may have been dictated with voice recognition software. Similar sounding words can inadvertently be transcribed and this note may contain transcription errors which may not have been corrected upon publication of note.**

## 2014-01-08 NOTE — Telephone Encounter (Signed)
Patient caregiver called on 01/01/14 requesting BP medication refill as well as stating that she is confused on medication and why refills are not being granted. I advised that Nifedipine has been denied by pulmonary and that I did not feel comfortable with refilling due to being listed as an allergy. I advised that due to patient currently being in the hospital that they will manage medication and upon discharge will make needed changes/additions as well as advise patient to schedule a follow up appt with PCP. At the follow up visit all medication should be brought in to clarify what should and should not be taken.

## 2014-01-11 ENCOUNTER — Other Ambulatory Visit: Payer: Self-pay | Admitting: Pulmonary Disease

## 2014-01-13 ENCOUNTER — Non-Acute Institutional Stay (SKILLED_NURSING_FACILITY): Payer: Medicare Other | Admitting: Internal Medicine

## 2014-01-13 ENCOUNTER — Encounter: Payer: Self-pay | Admitting: Internal Medicine

## 2014-01-13 DIAGNOSIS — R55 Syncope and collapse: Secondary | ICD-10-CM

## 2014-01-13 DIAGNOSIS — E1129 Type 2 diabetes mellitus with other diabetic kidney complication: Secondary | ICD-10-CM

## 2014-01-13 DIAGNOSIS — N183 Chronic kidney disease, stage 3 unspecified: Secondary | ICD-10-CM

## 2014-01-13 DIAGNOSIS — S129XXD Fracture of neck, unspecified, subsequent encounter: Secondary | ICD-10-CM

## 2014-01-13 DIAGNOSIS — N3000 Acute cystitis without hematuria: Secondary | ICD-10-CM

## 2014-01-13 DIAGNOSIS — I10 Essential (primary) hypertension: Secondary | ICD-10-CM

## 2014-01-13 DIAGNOSIS — N400 Enlarged prostate without lower urinary tract symptoms: Secondary | ICD-10-CM

## 2014-01-13 DIAGNOSIS — E1165 Type 2 diabetes mellitus with hyperglycemia: Secondary | ICD-10-CM

## 2014-01-13 DIAGNOSIS — I359 Nonrheumatic aortic valve disorder, unspecified: Secondary | ICD-10-CM

## 2014-01-13 DIAGNOSIS — N390 Urinary tract infection, site not specified: Secondary | ICD-10-CM | POA: Insufficient documentation

## 2014-01-13 DIAGNOSIS — IMO0002 Reserved for concepts with insufficient information to code with codable children: Secondary | ICD-10-CM

## 2014-01-13 NOTE — Assessment & Plan Note (Signed)
mild to moderate aortic stenosis EF 60% with LVH

## 2014-01-13 NOTE — Assessment & Plan Note (Signed)
Given a dose of fosfomycin

## 2014-01-13 NOTE — Assessment & Plan Note (Signed)
baseline creatinine around 2.4. Resolved and now close to baseline. We'll resume Lasix at lower than home dose. Monitor BMP closely.

## 2014-01-13 NOTE — Assessment & Plan Note (Signed)
On alpha blockers continue, cannot hold it despite orthostatics being positive as her severe underlying BPH

## 2014-01-13 NOTE — Assessment & Plan Note (Signed)
In poor control. Placed on low-dose Lantus along with sliding scale, Glucotrol dose has been lowered a little bit. Monitor CBGs closely and adjust medications as needed will request to do Accu-Cheks q. a.c. HSP

## 2014-01-13 NOTE — Progress Notes (Signed)
MRN: [id number] Name: Lebert Lovern  Sex: male Age: 80 y.o. DOB: 21-May-1917  [place] #: [facility] Facility/Room: 112A Level Of Care: SNF Provider: Inocencio Homes D Emergency Contacts: Extended Emergency Contact Information Primary Emergency Contact: Wilemon,[name] Address: [address]          [place], [place] [address] [place] Phone: [phone] Relation: Spouse Secondary Emergency Contact: Ford,Bonita Address: [address]          [place], [place] [address] [place] of [place] Phone: [phone] Work Phone: [phone] Mobile Phone: [phone] Relation: Friend  Code Status:   Allergies: Nifedipine  Chief Complaint  Patient presents with  . nursing home admission    HPI: Patient is 80 y.o. male who is s/p a syncopal episode and sustained a C2 fx, being tx with collar for 8 weeks, admitted for OT/PT  Past Medical History  Diagnosis Date  . Dental caries   . Hypertension   . Aortic stenosis     Echocardiogram 12/20/11: Mild LVH, EF 40-34%, grade 1 diastolic dysfunction, mild aortic stenosis, mean gradient 13, AVA 1.39 cm, mild MAC and nodular sclerosis of the tip of the anterior leaflet, mild MR, mild to moderate LAE, mild TR.  [name] Peripheral vascular disease   . Venous insufficiency   . Diabetes mellitus   . Diverticulosis of colon   . History of colonic polyps   . Renal insufficiency   . Adenocarcinoma of prostate   . Chronic low back pain   . History of syncope   . Peripheral neuropathy   . Sebaceous cyst   . Anemia   . Hyperkalemia 12/19/2011  . Syncope     History reviewed. No pertinent past surgical history.    Medication List       This list is accurate as of: 01/13/14 10:25 PM.  Always use your most recent med list.               aspirin 81 MG tablet  Take 81 mg by mouth daily.     ferrous sulfate 325 (65 FE) MG tablet  Take 325 mg by mouth daily.     finasteride 5 MG tablet  Commonly known as:  PROSCAR   Take 5 mg by mouth daily.     furosemide 20 MG tablet  Commonly known as:  LASIX  Take 1 tablet (20 mg total) by mouth daily.     hydrALAZINE 50 MG tablet  Commonly known as:  APRESOLINE  Take 50 mg by mouth 3 (three) times daily.     insulin aspart 100 UNIT/ML injection  Commonly known as:  novoLOG  - Before each meal 3 times a day, 140-199 - 2 units, 200-250 - 4 units, 251-299 - 6 units,  300-349 - 8 units,  350 or above 10 units.  - Insulin PEN if approved, provide syringes and needles if needed.     insulin glargine 100 UNIT/ML injection  Commonly known as:  LANTUS  Inject 0.06 mLs (6 Units total) into the skin daily.     isosorbide mononitrate 30 MG 24 hr tablet  Commonly known as:  IMDUR  Take 30 mg by mouth daily.     isosorbide mononitrate 30 MG 24 hr tablet  Commonly known as:  IMDUR  TAKE ONE TABLET BY MOUTH ONE TIME DAILY     meclizine 25 MG tablet  Commonly known as:  ANTIVERT  Take 25 mg by mouth 3 (three) times daily as needed for dizziness.  MENS MULTIVITAMIN PLUS PO  Take 1 tablet by mouth daily.     NIFEdipine 30 MG 24 hr tablet  Commonly known as:  PROCARDIA-XL/ADALAT-CC/NIFEDICAL-XL  Take 30 mg by mouth 2 (two) times daily.     predniSONE 10 MG tablet  Commonly known as:  DELTASONE  Take 10 mg by mouth daily.     senna-docusate 8.6-50 MG per tablet  Commonly known as:  Senokot-S  Take 1 tablet by mouth daily as needed for moderate constipation.     tamsulosin 0.4 MG Caps capsule  Commonly known as:  FLOMAX  Take 0.4 mg by mouth 2 (two) times daily.     vitamin B-12 500 MCG tablet  Commonly known as:  CYANOCOBALAMIN  Take 500 mcg by mouth daily.     vitamin C 500 MG tablet  Commonly known as:  ASCORBIC ACID  Take 500 mg by mouth daily.        No orders of the defined types were placed in this encounter.    Immunization History  Administered Date(s) Administered  . Influenza Split 04/13/2011, 03/23/2012  . Influenza Whole  05/11/2007, 05/07/2008, 05/23/2009, 07/30/2010  . Influenza,inj,Quad PF,36+ Mos 04/11/2013  . Pneumococcal Conjugate-13 09/27/2013  . Tdap 07/13/2006    History  Substance Use Topics  . Smoking status: Former Smoker    Types: Cigars    Quit date: 07/13/1978  . Smokeless tobacco: Not on file     Comment: for 2-3 years  . Alcohol Use: No    Family history is noncontributory    Review of Systems  DATA OBTAINED: from patient; no c/o but his family really didn't understand that he had broken his neck GENERAL: Feels well no fevers, fatigue, appetite changes SKIN: No itching, rash or wounds EYES: No eye pain, redness, discharge EARS: No earache, tinnitus, change in hearing NOSE: No congestion, drainage or bleeding  MOUTH/THROAT: No mouth or tooth pain, No sore throat RESPIRATORY: No cough, wheezing, SOB CARDIAC: No chest pain, palpitations, lower extremity edema  GI: No abdominal pain, No N/V/D or constipation, No heartburn or reflux  GU: No dysuria, frequency or urgency, or incontinence  MUSCULOSKELETAL: No unrelieved bone/joint pain NEUROLOGIC: No headache, dizziness or focal weakness PSYCHIATRIC: No overt anxiety or sadness. Sleeps well. No behavior issue.   Filed Vitals:   01/13/14 2207  BP: 202/77  Pulse: 65  Temp: 98.5 F (36.9 C)  Resp: 20    Physical Exam  GENERAL APPEARANCE: Alert, min conversant. Appropriately groomed; pt is very uncomfortable with C collar  SKIN: No diaphoresis rash, or wounds HEAD: Normocephalic, atraumatic  EYES: Conjunctiva/lids clear. Pupils round, reactive. EOMs intact.  EARS: External exam WNL, canals clear. Hearing grossly normal.  NOSE: No deformity or discharge.  MOUTH/THROAT: Lips w/o lesions. RESPIRATORY: Breathing is even, unlabored. Lung sounds are clear   CARDIOVASCULAR: Heart RRR no murmurs, rubs or gallops. No peripheral edema.   GASTROINTESTINAL: Abdomen is soft, non-tender, not distended w/ normal bowel  sounds GENITOURINARY: Bladder non tender, not distended  MUSCULOSKELETAL: No abnormal joints or musculature NEUROLOGIC:  Cranial nerves 2-12 grossly intact. Moves all extremities  PSYCHIATRIC: Mood and affect appropriate to situation, no behavioral issues  Patient Active Problem List   Diagnosis Date Noted  . BPH (benign prostatic hypertrophy) 01/13/2014  . UTI (urinary tract infection) 01/13/2014  . Cervical spine fracture 01/02/2014  . Syncope 01/01/2014  . Hyperlipidemia with target LDL less than 100 12/07/2013  . Non-compliant behavior 11/23/2013  . Edema 10/09/2013  . Venous  insufficiency 10/09/2013  . Dementia 09/27/2013  . CHF (congestive heart failure), NYHA class I 09/27/2013  . DJD (degenerative joint disease) 01/18/2013  . CKD (chronic kidney disease) stage 4, GFR 15-29 ml/min 12/27/2011  . ADENOCARCINOMA, PROSTATE 11/09/2007  . Type II or unspecified type diabetes mellitus with renal manifestations, uncontrolled 11/09/2007  . PERIPHERAL NEUROPATHY 11/09/2007  . AORTIC STENOSIS 11/09/2007  . ANEMIA 05/17/2007  . HYPERTENSION 05/17/2007  . LOW BACK PAIN, CHRONIC 05/17/2007    CBC    Component Value Date/Time   WBC 6.3 01/04/2014 0428   RBC 4.14* 01/04/2014 0428   HGB 12.0* 01/04/2014 0428   HCT 37.2* 01/04/2014 0428   PLT 196 01/04/2014 0428   MCV 89.9 01/04/2014 0428   LYMPHSABS 0.9 01/04/2014 0428   MONOABS 0.8 01/04/2014 0428   EOSABS 0.0 01/04/2014 0428   BASOSABS 0.0 01/04/2014 0428    CMP     Component Value Date/Time   NA 139 01/03/2014 0605   K 4.9 01/03/2014 0605   CL 102 01/03/2014 0605   CO2 24 01/03/2014 0605   GLUCOSE 82 01/03/2014 0605   GLUCOSE 115* 05/18/2006 0803   BUN 35* 01/03/2014 0605   CREATININE 2.14* 01/03/2014 0605   CALCIUM 8.8 01/03/2014 0605   PROT 6.6 01/02/2014 0418   ALBUMIN 2.9* 01/02/2014 0418   AST 38* 01/02/2014 0418   ALT 28 01/02/2014 0418   ALKPHOS 77 01/02/2014 0418   BILITOT 0.3 01/02/2014 0418   GFRNONAA 25* 01/03/2014 0605    GFRAA 29* 01/03/2014 0605    Assessment and Plan  Syncope history of bradycardia in the past. Also has underlying mild aortic stenosis, venous insufficiency and on alpha blockers, mildly orthostatic.  Seen by cardiology no further testing per cardiology no pacemaker required, stable TSH, is mildly orthostatic and will place on TED stockings, cannot discontinue alpha blockers for BPH as he has severe BPH underlying, please apply TED stockings at all times when he is out of bed, fall precautions and assistance when he is out of bed, high-risk candidate for fall. Will require SNF placement short-term versus permanent. Have reduced Lasix dose to see if it helps with orthostatics   Cervical spine fracture Case was discussed by ER physician with neurosurgeon on call Dr. Saintclair Halsted, not an operative candidate due to advanced age, c-collar for at least 49 weeks with one month followup with neurosurgery in the office. No deficits   Type II or unspecified type diabetes mellitus with renal manifestations, uncontrolled In poor control. Placed on low-dose Lantus along with sliding scale, Glucotrol dose has been lowered a little bit. Monitor CBGs closely and adjust medications as needed will request to do Accu-Cheks q. a.c. HSP   HYPERTENSION Stable continue present regimen, apply TED stockings whenever out of bed. Full fall precautions   BPH (benign prostatic hypertrophy) On alpha blockers continue, cannot hold it despite orthostatics being positive as her severe underlying BPH   UTI (urinary tract infection) Given a dose of fosfomycin   CKD (chronic kidney disease) stage 4, GFR 15-29 ml/min baseline creatinine around 2.4. Resolved and now close to baseline. We'll resume Lasix at lower than home dose. Monitor BMP closely.   AORTIC STENOSIS mild to moderate aortic stenosis EF 60% with LVH   Pt was seen and examined 01/04/2014.  Hennie Duos, MD

## 2014-01-13 NOTE — Assessment & Plan Note (Signed)
history of bradycardia in the past. Also has underlying mild aortic stenosis, venous insufficiency and on alpha blockers, mildly orthostatic.  Seen by cardiology no further testing per cardiology no pacemaker required, stable TSH, is mildly orthostatic and will place on TED stockings, cannot discontinue alpha blockers for BPH as he has severe BPH underlying, please apply TED stockings at all times when he is out of bed, fall precautions and assistance when he is out of bed, high-risk candidate for fall. Will require SNF placement short-term versus permanent. Have reduced Lasix dose to see if it helps with orthostatics

## 2014-01-13 NOTE — Assessment & Plan Note (Signed)
Stable continue present regimen, apply TED stockings whenever out of bed. Full fall precautions

## 2014-01-13 NOTE — Assessment & Plan Note (Signed)
Case was discussed by ER physician with neurosurgeon on call Dr. Saintclair Halsted, not an operative candidate due to advanced age, c-collar for at least 45 weeks with one month followup with neurosurgery in the office. No deficits

## 2014-01-19 ENCOUNTER — Non-Acute Institutional Stay (SKILLED_NURSING_FACILITY): Payer: Medicare Other | Admitting: Nurse Practitioner

## 2014-01-19 DIAGNOSIS — E1165 Type 2 diabetes mellitus with hyperglycemia: Principal | ICD-10-CM

## 2014-01-19 DIAGNOSIS — E1129 Type 2 diabetes mellitus with other diabetic kidney complication: Secondary | ICD-10-CM

## 2014-01-19 NOTE — Progress Notes (Signed)
Patient ID: Shane [name], male   DOB: 1917/04/10, 80 y.o.   MRN: [id number]    Nursing Home Location:  [facility]: SNF (31)  PCP: [name], MD  Allergies  Allergen Reactions  . Nifedipine     edema    Chief Complaint  Patient presents with  . Acute Visit    elevated blood sugars    HPI:  Patient is 80 y.o. male who is s/p a syncopal episode and sustained a C2 fx, being tx with collar for 8 weeks, admitted to [facility] for rehab. Pt being seen today at the request of nursing due to  having poor glycemic control. Blood sugars at before meals elevated from high 200s- 500s. Pt with fasting blood sugars 120-150s   Review of Systems:  Review of Systems  Constitutional: Negative for fever and chills.  Eyes: Negative for blurred vision.  Respiratory: Negative for shortness of breath.   Cardiovascular: Negative for chest pain.  Gastrointestinal: Negative for diarrhea and constipation.  Genitourinary: Negative for dysuria, urgency and frequency.  Skin: Negative.   Neurological: Negative for dizziness, tingling, weakness and headaches.  Endo/Heme/Allergies: Negative for polydipsia.     Past Medical History  Diagnosis Date  . Dental caries   . Hypertension   . Aortic stenosis     Echocardiogram 12/20/11: Mild LVH, EF 03-00%, grade 1 diastolic dysfunction, mild aortic stenosis, mean gradient 13, AVA 1.39 cm, mild MAC and nodular sclerosis of the tip of the anterior leaflet, mild MR, mild to moderate LAE, mild TR.  [name] Peripheral vascular disease   . Venous insufficiency   . Diabetes mellitus   . Diverticulosis of colon   . History of colonic polyps   . Renal insufficiency   . Adenocarcinoma of prostate   . Chronic low back pain   . History of syncope   . Peripheral neuropathy   . Sebaceous cyst   . Anemia   . Hyperkalemia 12/19/2011  . Syncope    No past surgical history on file. Social History:   reports that he quit smoking about  35 years ago. His smoking use included Cigars. He does not have any smokeless tobacco history on file. He reports that he does not drink alcohol or use illicit drugs.  Family History  Problem Relation Age of Onset  . Hypertension      Medications: Patient's Medications  New Prescriptions   No medications on file  Previous Medications   ASPIRIN 81 MG TABLET    Take 81 mg by mouth daily.   FERROUS SULFATE 325 (65 FE) MG TABLET    Take 325 mg by mouth daily.   FINASTERIDE (PROSCAR) 5 MG TABLET    Take 5 mg by mouth daily.   FUROSEMIDE (LASIX) 20 MG TABLET    Take 1 tablet (20 mg total) by mouth daily.   HYDRALAZINE (APRESOLINE) 50 MG TABLET    Take 50 mg by mouth 3 (three) times daily.   INSULIN ASPART (NOVOLOG) 100 UNIT/ML INJECTION    Before each meal 3 times a day, 140-199 - 2 units, 200-250 - 4 units, 251-299 - 6 units,  300-349 - 8 units,  350 or above 10 units. Insulin PEN if approved, provide syringes and needles if needed.   INSULIN GLARGINE (LANTUS) 100 UNIT/ML INJECTION    Inject 0.06 mLs (6 Units total) into the skin daily.   ISOSORBIDE MONONITRATE (IMDUR) 30 MG 24 HR TABLET    TAKE ONE  TABLET BY MOUTH ONE TIME DAILY   ISOSORBIDE MONONITRATE (IMDUR) 30 MG 24 HR TABLET    Take 30 mg by mouth daily.   MECLIZINE (ANTIVERT) 25 MG TABLET    Take 25 mg by mouth 3 (three) times daily as needed for dizziness.   MULTIPLE VITAMINS-MINERALS (MENS MULTIVITAMIN PLUS PO)    Take 1 tablet by mouth daily.   NIFEDIPINE (PROCARDIA-XL/ADALAT-CC/NIFEDICAL-XL) 30 MG 24 HR TABLET    Take 30 mg by mouth 2 (two) times daily.   PREDNISONE (DELTASONE) 10 MG TABLET    Take 10 mg by mouth daily.   SENNA-DOCUSATE (SENOKOT-S) 8.6-50 MG PER TABLET    Take 1 tablet by mouth daily as needed for moderate constipation.    TAMSULOSIN (FLOMAX) 0.4 MG CAPS CAPSULE    Take 0.4 mg by mouth 2 (two) times daily.    VITAMIN B-12 (CYANOCOBALAMIN) 500 MCG TABLET    Take 500 mcg by mouth daily.     VITAMIN C (ASCORBIC ACID)  500 MG TABLET    Take 500 mg by mouth daily.  Modified Medications   No medications on file  Discontinued Medications   No medications on file     Physical Exam: Physical Exam  Constitutional: He is well-developed, well-nourished, and in no distress.  Cardiovascular: Normal rate, regular rhythm and normal heart sounds.   Pulmonary/Chest: Effort normal and breath sounds normal.  Abdominal: Soft. Bowel sounds are normal.  Musculoskeletal:  c collar on  Neurological: He is alert.  Skin: Skin is warm and dry.  Psychiatric: Affect normal.     Filed Vitals:   01/19/14 1530  BP: 141/80  Pulse: 60  Temp: 98 F (36.7 C)  Resp: 20      Labs reviewed: Basic Metabolic Panel:  Recent Labs  01/01/14 1520 01/01/14 2007 01/02/14 0418 01/03/14 0605  NA 142  --  142 139  K 5.3  --  4.9 4.9  CL 103  --  101 102  CO2 23  --  27 24  GLUCOSE 47*  --  83 82  BUN 44*  --  39* 35*  CREATININE 2.60* 2.55* 2.41* 2.14*  CALCIUM 9.6  --  9.4 8.8   Liver Function Tests:  Recent Labs  08/11/13 1045 01/01/14 1520 01/02/14 0418  AST 33 49* 38*  ALT 69* 32 28  ALKPHOS 86 80 77  BILITOT 0.6 0.4 0.3  PROT 6.0 6.9 6.6  ALBUMIN 3.2* 3.2* 2.9*    Recent Labs  01/01/14 1520  LIPASE 24   No results found for this basename: AMMONIA,  in the last 8760 hours CBC:  Recent Labs  01/02/14 0418 01/03/14 0605 01/04/14 0428  WBC 7.1 6.1 6.3  NEUTROABS 5.7 4.8 4.6  HGB 11.2* 10.0* 12.0*  HCT 35.1* 30.7* 37.2*  MCV 89.8 87.7 89.9  PLT 175 174 196   Cardiac Enzymes:  Recent Labs  01/02/14 1141 01/02/14 1832  TROPONINI <0.30 <0.30   BNP: No components found with this basename: POCBNP,  CBG:  Recent Labs  01/04/14 0636 01/04/14 0734 01/04/14 1125  GLUCAP 112* 130* 182*   TSH:  Recent Labs  08/11/13 1045 12/07/13 0958 01/01/14 2007  TSH 1.50 1.85 0.839   A1C: Lab Results  Component Value Date   HGBA1C 9.0* 01/02/2014   Lipid Panel:  Recent Labs   12/07/13 0958  CHOL 186  HDL 77.10  LDLCALC 87  TRIG 109.0  CHOLHDL 2    Assessment/Plan 1. Type II or unspecified type  diabetes mellitus with renal manifestations, uncontrolled -conts on lantus, will change SSI to novolog to 5 units with meals and 5 units for blood sugars over 150 with meals, encouraged diet compliance and will cont to monitor

## 2014-02-06 ENCOUNTER — Ambulatory Visit (INDEPENDENT_AMBULATORY_CARE_PROVIDER_SITE_OTHER): Payer: Medicare Other | Admitting: Physician Assistant

## 2014-02-06 ENCOUNTER — Encounter: Payer: Self-pay | Admitting: Physician Assistant

## 2014-02-06 VITALS — BP 140/60 | HR 64 | Ht 66.0 in | Wt 153.0 lb

## 2014-02-06 DIAGNOSIS — I5032 Chronic diastolic (congestive) heart failure: Secondary | ICD-10-CM

## 2014-02-06 DIAGNOSIS — I359 Nonrheumatic aortic valve disorder, unspecified: Secondary | ICD-10-CM

## 2014-02-06 DIAGNOSIS — N184 Chronic kidney disease, stage 4 (severe): Secondary | ICD-10-CM

## 2014-02-06 DIAGNOSIS — R55 Syncope and collapse: Secondary | ICD-10-CM

## 2014-02-06 DIAGNOSIS — I10 Essential (primary) hypertension: Secondary | ICD-10-CM

## 2014-02-06 NOTE — Progress Notes (Signed)
Cardiology Office Note    Date:  02/06/2014   ID:  Shane [name], DOB 08/08/1916, MRN [id number]  PCP:  [name], MD  Cardiologist:  Dr. Thompson Grayer     History of Present Illness: Shane [name] is a 80 y.o. male with a history of aortic stenosis, diastolic CHF, prior syncope, diabetes, CKD. He was admitted 12/2013 with recurrent episodes of syncope. He suffered a C-spine fracture appear he was seen by cardiology.  He was somewhat orthostatic.  Medications were adjusted. He was allowed to have a higher blood pressure.  Patient has a history of bradycardia. He was monitored on telemetry. Heart rates remained in the 60s and 70s. There was no indication for pacemaker. Echocardiogram demonstrated normal LV function. Aortic stenosis was mild to moderate and not significantly changed.    Last seen by Dr. Thompson Grayer 09/2013.  He returns for follow up.  He is currently staying at [facility]. He has been followed by neurosurgery. He is currently in a c-collar. He denies significant dyspnea. He denies chest pain, syncope, orthopnea, PND or edema.   Studies:  - Echocardiogram 12/20/11: Mild LVH, EF 03-50%, grade 1 diastolic dysfunction, mild aortic stenosis, mean gradient 13, AVA 1.39 cm, mild MAC and nodular sclerosis of the tip of the anterior leaflet, mild MR, mild to moderate LAE, mild TR  - Echo (12/2013):  Moderate LVH, EF 60-65%, normal wall motion, grade 1 diastolic dysfunction, mild to moderate aortic stenosis (mean 20 mm Hg, peak 36 mm Hg), moderate TR, PASP 37 mm Hg  - Nuclear (8/13):  No scar or ischemia, normal study  - Carotid Dopplers 2/13: 0-39% bilateral ICA stenosis   Recent Labs: 12/07/2013: HDL Cholesterol by NMR 77.10; LDL (calc) 87  01/01/2014: Pro B Natriuretic peptide (BNP) 1850.0*; TSH 0.839  01/02/2014: ALT 28  01/03/2014: Creatinine 2.14*; Potassium 4.9  01/04/2014: Hemoglobin 12.0*   Wt Readings from Last 3 Encounters:  02/06/14 153 lb (69.4 kg)  01/04/14 152  lb 12.8 oz (69.31 kg)  10/09/13 155 lb 12.8 oz (70.67 kg)     Past Medical History  Diagnosis Date  . Dental caries   . Hypertension   . Aortic stenosis     Echocardiogram 12/20/11: Mild LVH, EF 09-38%, grade 1 diastolic dysfunction, mild aortic stenosis, mean gradient 13, AVA 1.39 cm, mild MAC and nodular sclerosis of the tip of the anterior leaflet, mild MR, mild to moderate LAE, mild TR.  [name] Peripheral vascular disease   . Venous insufficiency   . Diabetes mellitus   . Diverticulosis of colon   . History of colonic polyps   . Renal insufficiency   . Adenocarcinoma of prostate   . Chronic low back pain   . History of syncope   . Peripheral neuropathy   . Sebaceous cyst   . Anemia   . Hyperkalemia 12/19/2011  . Syncope     Current Outpatient Prescriptions  Medication Sig Dispense Refill  . aspirin 81 MG tablet Take 81 mg by mouth daily.      . ferrous sulfate 325 (65 FE) MG tablet Take 325 mg by mouth daily.      . finasteride (PROSCAR) 5 MG tablet Take 5 mg by mouth daily.      . furosemide (LASIX) 20 MG tablet Take 1 tablet (20 mg total) by mouth daily.  180 tablet  1  . hydrALAZINE (APRESOLINE) 50 MG tablet Take 50 mg by mouth 3 (three) times daily.      [name]  insulin aspart (NOVOLOG) 100 UNIT/ML injection Before each meal 3 times a day, 140-199 - 2 units, 200-250 - 4 units, 251-299 - 6 units,  300-349 - 8 units,  350 or above 10 units. Insulin PEN if approved, provide syringes and needles if needed.  10 mL  11  . insulin glargine (LANTUS) 100 UNIT/ML injection Inject 0.06 mLs (6 Units total) into the skin daily.  10 mL  11  . isosorbide mononitrate (IMDUR) 30 MG 24 hr tablet TAKE ONE TABLET BY MOUTH ONE TIME DAILY  30 tablet  0  . meclizine (ANTIVERT) 25 MG tablet Take 25 mg by mouth 3 (three) times daily as needed for dizziness.      . Multiple Vitamins-Minerals (MENS MULTIVITAMIN PLUS PO) Take 1 tablet by mouth daily.      [name] NIFEdipine (PROCARDIA-XL/ADALAT-CC/NIFEDICAL-XL) 30 MG  24 hr tablet Take 30 mg by mouth 2 (two) times daily.      . predniSONE (DELTASONE) 10 MG tablet Take 10 mg by mouth daily.      [name] senna-docusate (SENOKOT-S) 8.6-50 MG per tablet Take 1 tablet by mouth daily as needed for moderate constipation.       . tamsulosin (FLOMAX) 0.4 MG CAPS capsule Take 0.4 mg by mouth 2 (two) times daily.       . vitamin B-12 (CYANOCOBALAMIN) 500 MCG tablet Take 500 mcg by mouth daily.        . vitamin C (ASCORBIC ACID) 500 MG tablet Take 500 mg by mouth daily.       No current facility-administered medications for this visit.    Allergies:   Nifedipine   Social History:  The patient  reports that he quit smoking about 35 years ago. His smoking use included Cigars. He does not have any smokeless tobacco history on file. He reports that he does not drink alcohol or use illicit drugs.   Family History:  The patient's family history includes Hypertension in his mother and another family member.   ROS:  Please see the history of present illness.      All other systems reviewed and negative.   PHYSICAL EXAM: VS:  BP 140/60  Pulse 64  Ht 5\' 6"  (1.676 m)  Wt 153 lb (69.4 kg)  BMI 24.71 kg/m2 Well nourished, well developed, in no acute distress HEENT: normal Neck: I cannot assess JVDsecondary to c-collar Cardiac:  normal S1, S2; RRR; 2/6 systolic murmurRUSB Lungs:  clear to auscultation bilaterally, no wheezing, rhonchi or rales Abd: soft, nontender, no hepatomegaly Ext: trace-1+ bilateral LE edema Skin: warm and dry Neuro:  CNs 2-12 intact, no focal abnormalities noted  EKG:  NSR, HR 64, left axis deviation, poor R wave progression     ASSESSMENT AND PLAN:  Chronic diastolic heart failure:  Volume stable. Continue current therapy.  Unspecified essential hypertension:  Controlled.  AORTIC STENOSIS:  Mild to moderate by recent echocardiogram. He is not a surgical candidate.  Syncope, unspecified syncope type:  Likely related to orthostatic hypotension.  Current blood pressure is optimal. He had no indication for pacemaker during evaluation in the hospital.  CKD (chronic kidney disease) stage 4, GFR 15-29 ml/min:  Reason creatinine stable.   Disposition:  I discussed CODE STATUS with the patient and his wife today. They have not discussed this previously and wish to review it prior to making a decision. Follow up with Dr. Rayann Heman as planned.   Signed, Versie Starks, MHS 02/06/2014 11:42 AM    [facility] [address], [place], [place]  [address] Phone: [phone]; Fax: [phone]

## 2014-02-06 NOTE — Patient Instructions (Signed)
Your physician recommends that you continue on your current medications as directed. Please refer to the Current Medication list given to you today.   Your physician wants you to follow-up in: 09/2014 WITH DR. ALLRED. You will receive a reminder letter in the mail two months in advance. If you don't receive a letter, please call our office to schedule the follow-up appointment.

## 2014-02-15 ENCOUNTER — Other Ambulatory Visit: Payer: Self-pay | Admitting: Neurosurgery

## 2014-02-15 DIAGNOSIS — S12100A Unspecified displaced fracture of second cervical vertebra, initial encounter for closed fracture: Secondary | ICD-10-CM

## 2014-02-16 ENCOUNTER — Non-Acute Institutional Stay (SKILLED_NURSING_FACILITY): Payer: Medicare Other | Admitting: Nurse Practitioner

## 2014-02-16 DIAGNOSIS — I10 Essential (primary) hypertension: Secondary | ICD-10-CM

## 2014-02-16 DIAGNOSIS — D649 Anemia, unspecified: Secondary | ICD-10-CM

## 2014-02-16 DIAGNOSIS — IMO0002 Reserved for concepts with insufficient information to code with codable children: Secondary | ICD-10-CM

## 2014-02-16 DIAGNOSIS — E1129 Type 2 diabetes mellitus with other diabetic kidney complication: Secondary | ICD-10-CM

## 2014-02-16 DIAGNOSIS — S129XXD Fracture of neck, unspecified, subsequent encounter: Secondary | ICD-10-CM

## 2014-02-16 DIAGNOSIS — N400 Enlarged prostate without lower urinary tract symptoms: Secondary | ICD-10-CM

## 2014-02-16 DIAGNOSIS — E1165 Type 2 diabetes mellitus with hyperglycemia: Secondary | ICD-10-CM

## 2014-02-16 DIAGNOSIS — N184 Chronic kidney disease, stage 4 (severe): Secondary | ICD-10-CM

## 2014-02-16 DIAGNOSIS — I5032 Chronic diastolic (congestive) heart failure: Secondary | ICD-10-CM

## 2014-02-16 NOTE — Progress Notes (Signed)
Patient ID: Shane [name], male   DOB: June 17, 1917, 80 y.o.   MRN: [id number]    Nursing Home Location:  [facility] of Service: SNF (31)  PCP: [name], MD  Allergies  Allergen Reactions  . Nifedipine     edema    Chief Complaint  Patient presents with  . Medical Management of Chronic Issues    HPI:  Patient is 80 y.o. male who is s/p a syncopal episode and sustained a C2 fx, being tx with collar, admitted to [facility] for rehab. Pt being seen today for routine follow up on chronic conditions. Pt doing well with therapy. Does not report pain Staff reports adequate PO intake. No falls since last visit.  Review of Systems:  Review of Systems  Constitutional: Negative for fever and chills.  Eyes: Negative for blurred vision.  Respiratory: Negative for shortness of breath.   Cardiovascular: Negative for chest pain.  Gastrointestinal: Negative for diarrhea and constipation.  Genitourinary: Negative for dysuria, urgency and frequency.  Skin: Negative.   Neurological: Negative for dizziness, tingling, weakness and headaches.  Endo/Heme/Allergies: Negative for polydipsia.  Psychiatric/Behavioral: Positive for memory loss.     Past Medical History  Diagnosis Date  . Dental caries   . Hypertension   . Aortic stenosis     Echocardiogram 12/20/11: Mild LVH, EF 38-93%, grade 1 diastolic dysfunction, mild aortic stenosis, mean gradient 13, AVA 1.39 cm, mild MAC and nodular sclerosis of the tip of the anterior leaflet, mild MR, mild to moderate LAE, mild TR.  [name] Peripheral vascular disease   . Venous insufficiency   . Diabetes mellitus   . Diverticulosis of colon   . History of colonic polyps   . Renal insufficiency   . Adenocarcinoma of prostate   . Chronic low back pain   . History of syncope   . Peripheral neuropathy   . Sebaceous cyst   . Anemia   . Hyperkalemia 12/19/2011  . Syncope    No past surgical history on file. Social History:  reports that he quit smoking about 35 years ago. His smoking use included Cigars. He does not have any smokeless tobacco history on file. He reports that he does not drink alcohol or use illicit drugs.  Family History  Problem Relation Age of Onset  . Hypertension    . Hypertension Mother     Medications: Patient's Medications  New Prescriptions   No medications on file  Previous Medications   ASPIRIN 81 MG TABLET    Take 81 mg by mouth daily.   FERROUS SULFATE 325 (65 FE) MG TABLET    Take 325 mg by mouth daily.   FINASTERIDE (PROSCAR) 5 MG TABLET    Take 5 mg by mouth daily.   FUROSEMIDE (LASIX) 20 MG TABLET    Take 1 tablet (20 mg total) by mouth daily.   HYDRALAZINE (APRESOLINE) 50 MG TABLET    Take 50 mg by mouth 3 (three) times daily.   INSULIN GLARGINE (LANTUS) 100 UNIT/ML INJECTION    Inject 0.06 mLs (6 Units total) into the skin daily.   ISOSORBIDE MONONITRATE (IMDUR) 30 MG 24 HR TABLET    TAKE ONE TABLET BY MOUTH ONE TIME DAILY   MECLIZINE (ANTIVERT) 25 MG TABLET    Take 25 mg by mouth 3 (three) times daily as needed for dizziness.   MULTIPLE VITAMINS-MINERALS (MENS MULTIVITAMIN PLUS PO)    Take 1 tablet by mouth daily.   NIFEDIPINE (PROCARDIA-XL/ADALAT-CC/NIFEDICAL-XL) 30  MG 24 HR TABLET    Take 30 mg by mouth 2 (two) times daily.   PREDNISONE (DELTASONE) 10 MG TABLET    Take 10 mg by mouth daily.   SENNA-DOCUSATE (SENOKOT-S) 8.6-50 MG PER TABLET    Take 1 tablet by mouth daily as needed for moderate constipation.    TAMSULOSIN (FLOMAX) 0.4 MG CAPS CAPSULE    Take 0.4 mg by mouth 2 (two) times daily.    VITAMIN B-12 (CYANOCOBALAMIN) 500 MCG TABLET    Take 500 mcg by mouth daily.     VITAMIN C (ASCORBIC ACID) 500 MG TABLET    Take 500 mg by mouth daily.  Modified Medications   Modified Medication Previous Medication   INSULIN ASPART (NOVOLOG) 100 UNIT/ML INJECTION insulin aspart (NOVOLOG) 100 UNIT/ML injection      5 units with meals and 5 additional units if blood sugars  above 150 with meals to equal 10 units    Before each meal 3 times a day, 140-199 - 2 units, 200-250 - 4 units, 251-299 - 6 units,  300-349 - 8 units,  350 or above 10 units. Insulin PEN if approved, provide syringes and needles if needed.  Discontinued Medications   No medications on file     Physical Exam: Physical Exam  Constitutional: He is well-developed, well-nourished, and in no distress.  HENT:  Mouth/Throat: Oropharynx is clear and moist. No oropharyngeal exudate.  Neck: Normal range of motion. Neck supple.  Cardiovascular: Normal rate, regular rhythm and normal heart sounds.   Pulmonary/Chest: Effort normal and breath sounds normal.  Abdominal: Soft. Bowel sounds are normal. He exhibits no distension.  Musculoskeletal: He exhibits no edema and no tenderness.  c collar on  Neurological: He is alert.  Skin: Skin is warm and dry.  Psychiatric: Affect normal.     Filed Vitals:   02/16/14 1629  BP: 124/56  Pulse: 72  Temp: 97.2 F (36.2 C)  Resp: 20  Weight: 146 lb (66.225 kg)      Labs reviewed: Basic Metabolic Panel:  Recent Labs  01/01/14 1520 01/01/14 2007 01/02/14 0418 01/03/14 0605  NA 142  --  142 139  K 5.3  --  4.9 4.9  CL 103  --  101 102  CO2 23  --  27 24  GLUCOSE 47*  --  83 82  BUN 44*  --  39* 35*  CREATININE 2.60* 2.55* 2.41* 2.14*  CALCIUM 9.6  --  9.4 8.8   Liver Function Tests:  Recent Labs  08/11/13 1045 01/01/14 1520 01/02/14 0418  AST 33 49* 38*  ALT 69* 32 28  ALKPHOS 86 80 77  BILITOT 0.6 0.4 0.3  PROT 6.0 6.9 6.6  ALBUMIN 3.2* 3.2* 2.9*    Recent Labs  01/01/14 1520  LIPASE 24   No results found for this basename: AMMONIA,  in the last 8760 hours CBC:  Recent Labs  01/02/14 0418 01/03/14 0605 01/04/14 0428  WBC 7.1 6.1 6.3  NEUTROABS 5.7 4.8 4.6  HGB 11.2* 10.0* 12.0*  HCT 35.1* 30.7* 37.2*  MCV 89.8 87.7 89.9  PLT 175 174 196   Cardiac Enzymes:  Recent Labs  01/02/14 1141 01/02/14 1832    TROPONINI <0.30 <0.30   BNP: No components found with this basename: POCBNP,  CBG:  Recent Labs  01/04/14 0636 01/04/14 0734 01/04/14 1125  GLUCAP 112* 130* 182*   TSH:  Recent Labs  08/11/13 1045 12/07/13 0958 01/01/14 2007  TSH 1.50 1.85 0.839  A1C: Lab Results  Component Value Date   HGBA1C 9.0* 01/02/2014   Lipid Panel:  Recent Labs  12/07/13 0958  CHOL 186  HDL 77.10  LDLCALC 87  TRIG 109.0  CHOLHDL 2   CBC NO Diff (Complete Blood Count)    Result: 01/05/2014 2:32 PM   ( Status: F )       WBC 5.2     4.0-10.5 K/uL SLN   RBC 4.02   L 4.22-5.81 MIL/uL SLN   Hemoglobin 11.6   L 13.0-17.0 g/dL SLN   Hematocrit 34.4   L 39.0-52.0 % SLN   MCV 85.6     78.0-100.0 fL SLN   MCH 28.9     26.0-34.0 pg SLN   MCHC 33.7     30.0-36.0 g/dL SLN   RDW 15.7   H 11.5-15.5 % SLN   Platelet Count 193     150-400 K/uL SLN   Basic Metabolic Panel    Result: 01/05/2014 2:56 PM   ( Status: F )       Sodium 141     135-145 mEq/L SLN   Potassium 4.7     3.5-5.3 mEq/L SLN   Chloride 104     96-112 mEq/L SLN   CO2 27     19-32 mEq/L SLN   Glucose 79     70-99 mg/dL SLN   BUN 22     6-23 mg/dL SLN   Creatinine 1.85   H 0.50-1.35 mg/dL SLN   Calcium 8.6     8.4-10.5 mg/dL SLN   Assessment/Plan  1. HYPERTENSION -Patient is stable; continue current regimen. Will monitor and make changes as necessary.  2. Chronic diastolic heart failure -compensated, conts on lasix   3. Type II or unspecified type diabetes mellitus with renal manifestations, uncontrolled -cbgs reviewed ranging from 85-300 fasting  -will get A1c at this time -due to fluctuation in blood sugars will not adjust insulin due to risk of hypoglycemic episode  4. Cervical spine fracture, subsequent encounter -conts to wear c collar, following with ortho and participating in rehab  5. BPH (benign prostatic hypertrophy) -conts on flomax and proscar, no reports of difficulty voiding   6. CKD (chronic kidney  disease) stage 4, GFR 15-29 ml/min -Cr stable, will follow up bmp  7. ANEMIA -cbc reviewed Hgb stable. conts on iron, will follow up cbc

## 2014-02-19 ENCOUNTER — Encounter: Payer: Self-pay | Admitting: Nurse Practitioner

## 2014-02-19 ENCOUNTER — Non-Acute Institutional Stay (SKILLED_NURSING_FACILITY): Payer: Medicare Other | Admitting: Nurse Practitioner

## 2014-02-19 DIAGNOSIS — E1165 Type 2 diabetes mellitus with hyperglycemia: Principal | ICD-10-CM

## 2014-02-19 DIAGNOSIS — K5900 Constipation, unspecified: Secondary | ICD-10-CM | POA: Insufficient documentation

## 2014-02-19 DIAGNOSIS — E1129 Type 2 diabetes mellitus with other diabetic kidney complication: Secondary | ICD-10-CM

## 2014-02-19 DIAGNOSIS — R251 Tremor, unspecified: Secondary | ICD-10-CM

## 2014-02-19 DIAGNOSIS — R259 Unspecified abnormal involuntary movements: Secondary | ICD-10-CM

## 2014-02-19 NOTE — Assessment & Plan Note (Addendum)
This was noted at rest and with movement. Consider adding primidone if this problem continues. This does not seem to be parkinsonism.  May be due to CBG, see above. Check TSH.

## 2014-02-19 NOTE — Progress Notes (Signed)
Patient ID: Shane [name], male   DOB: 11/08/1916, 80 y.o.   MRN: [id number]   [facility] (31)  Code Status:  Full Code  Chief Complaint  Patient presents with  . f/u shaking episodes    HPI: This is a 80 y.o. male with a hx of C2 fx after a syncopal episode in June of 2015. I was asked to see him for an episode of shaking noted on 02/18/14.  There was minimal information regarding this but I do not think he lost consciousness. Today he is sitting at the bed, drinking his milk with a mild tremor to both hands. His blood sugar was 60 this morning but has run 250-500 at lunch and supper. He has continue neck pain but no other complaints. 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 It was recommended for him to continue with the c collar and have a f/u ct scan in 3 months.  He also reported constipation and bloating today, can not remember the date of his last BM.  Allergies  Allergen Reactions  . Nifedipine     edema    MEDICATIONS -  reviewed Outpatient Encounter Prescriptions as of 02/19/2014  Medication Sig  . aspirin 81 MG tablet Take 81 mg by mouth daily.  . ferrous sulfate 325 (65 FE) MG tablet Take 325 mg by mouth daily.  . finasteride (PROSCAR) 5 MG tablet Take 5 mg by mouth daily.  . furosemide (LASIX) 20 MG tablet Take 1 tablet (20 mg total) by mouth daily.  . hydrALAZINE (APRESOLINE) 50 MG tablet Take 50 mg by mouth 3 (three) times daily.  . insulin aspart (NOVOLOG) 100 UNIT/ML injection 5 units with meals and 5 additional units if blood sugars above 150 with meals to equal 10 units  . insulin glargine (LANTUS) 100 UNIT/ML injection Inject 0.06 mLs (6 Units total) into the skin daily.  . isosorbide mononitrate (IMDUR) 30 MG 24 hr tablet TAKE ONE TABLET BY MOUTH ONE TIME DAILY  . meclizine (ANTIVERT) 25 MG tablet Take 25 mg by mouth 3 (three) times daily as needed for dizziness.  . Multiple  Vitamins-Minerals (MENS MULTIVITAMIN PLUS PO) Take 1 tablet by mouth daily.  [name] NIFEdipine (PROCARDIA-XL/ADALAT-CC/NIFEDICAL-XL) 30 MG 24 hr tablet Take 30 mg by mouth 2 (two) times daily.  . predniSONE (DELTASONE) 10 MG tablet Take 10 mg by mouth daily.  [name] senna-docusate (SENOKOT-S) 8.6-50 MG per tablet Take 1 tablet by mouth daily as needed for moderate constipation.   . tamsulosin (FLOMAX) 0.4 MG CAPS capsule Take 0.4 mg by mouth 2 (two) times daily.   . vitamin B-12 (CYANOCOBALAMIN) 500 MCG tablet Take 500 mcg by mouth daily.    . vitamin C (ASCORBIC ACID) 500 MG tablet Take 500 mg by mouth daily.    DATA REVIEWED  Radiologic Exams CT of head and c spine (12/2013) IMPRESSION:  Type 2 dens fracture along the base the odontoid. These results were  called by telephone at the time of interpretation on 01/01/2014 at  5:54 PM to Dr. Ezequiel Essex , who verbally acknowledged these  results.  Findings within the cervical spine may represent sequela of  ankylosing spondylitis versus DISH  Multilevel spondylosis of the cervical spine  No acute intracranial abnormality. Chronic an and lucent changes.    Laboratory Studies Lab Results  Component Value Date   WBC 6.3 01/04/2014   HGB 12.0* 01/04/2014  HCT 37.2* 01/04/2014   MCV 89.9 01/04/2014   PLT 196 01/04/2014   Lab Results  Component Value Date   NA 139 01/03/2014   K 4.9 01/03/2014   BUN 35* 01/03/2014   CREATININE 2.14* 01/03/2014   Lab Results  Component Value Date   CALCIUM 8.8 01/03/2014   ALBUMIN 2.9* 01/02/2014   AST 38* 01/02/2014   ALT 28 01/02/2014   ALKPHOS 77 01/02/2014   BILITOT 0.3 01/02/2014   GFRNONAA 25* 01/03/2014   GFRAA 29* 01/03/2014   Lab Results  Component Value Date   RPRXYVOP92 924 08/11/2013   REVIEW OF SYSTEMS  DATA OBTAINED: from patient, nurse, medical record GENERAL: Feels well  No recent fever, fatigue, change in activity status, appetite, or weight  RESPIRATORY: No cough, wheezing, SOB CARDIAC: No  chest pain, palpitations. Edema to ankles GI: No abdominal pain  No Nausea,vomiting,diarrhea. No heartburn or reflux. Constipation reported. MUSCULOSKELETAL:neck pain, no pain to limbs NEUROLOGIC: No dizziness, fainting, headache, numbness No change in mental status. Episode of shaking reported by staff. PSYCHIATRIC: No feelings of anxiety, depression  Sleeps well   No behavior issue  Filed Vitals:   02/19/14 1140  BP: 128/52  Pulse: 68  Temp: 97.8 F (36.6 C)  Resp: 20   There is no weight on file to calculate BMI.  GENERAL APPEARANCE: No acute distress, appropriately groomed, normal body habitus. Alert, pleasant, conversant. SKIN: No diaphoresis, rash, unusual lesions, wounds HEAD: Normocephalic, atraumatic EYES: Conjunctiva/lids clear. Pupils round, reactive. EOMs intact.  RESPIRATORY: Breathing is even, unlabored. Lung sounds are coarse bilaterally. CARDIOVASCULAR: Heart RRR. No murmur or extra heart sounds  EDEMA: trace edema to both ankles. GASTROINTESTINAL: Abdomen is soft, non-tender, slightly distended w/ decreased bowel sounds.  MUSCULOSKELETAL: Moves all extremities with full ROM. Strength 4/5 to BUE and BLE NEUROLOGIC: Oriented to time, place, person. Slow, slightly unclear speech.  Patella, brachial DTR 2+. Tremor to both hands resting. PSYCHIATRIC: Mood and affect appropriate to situation  ASSESSMENT/PLAN   Type II or unspecified type diabetes mellitus with renal manifestations, uncontrolled Will change admin of Lantus to 10am at 6 units daily. Ensure a bedtime snack. His tremor may be due to a low blood sugar as it has not been noted later in the day. Will continue Novolog at 5 units with meals, hold for CBG <70. His blood sugar his very labile, will need to monitored closely to prevent hypoglycemia.  Tremor This was noted at rest and with movement. Consider adding primidone if this problem continues. This does not seem to be parkinsonism.  May be due to CBG, see  above. Check TSH.  Unspecified constipation Begin Colace 100mg  qd, and give a Dulcolax supp x1 today  CBC, BMP, A1C pending today  Hassell Done, NP/Christina Melvyn Novas RN, MSN  02/19/2014

## 2014-02-19 NOTE — Assessment & Plan Note (Signed)
Begin Colace 100mg  qd, and give a Dulcolax supp x1 today

## 2014-02-19 NOTE — Assessment & Plan Note (Addendum)
Will change admin of Lantus to 10am at 6 units daily. Ensure a bedtime snack. His tremor may be due to a low blood sugar as it has not been noted later in the day. Will continue Novolog at 5 units with meals, hold for CBG <70. His blood sugar his very labile, will need to monitored closely to prevent hypoglycemia.

## 2014-03-01 ENCOUNTER — Ambulatory Visit
Admission: RE | Admit: 2014-03-01 | Discharge: 2014-03-01 | Disposition: A | Discharge status: Home / Self Care | Payer: Medicare Other | Source: Ambulatory Visit | Attending: Neurosurgery | Admitting: Neurosurgery

## 2014-03-01 DIAGNOSIS — S12100A Unspecified displaced fracture of second cervical vertebra, initial encounter for closed fracture: Secondary | ICD-10-CM

## 2014-03-05 ENCOUNTER — Encounter: Payer: Self-pay | Admitting: Internal Medicine

## 2014-03-05 ENCOUNTER — Non-Acute Institutional Stay (SKILLED_NURSING_FACILITY): Payer: Medicare Other | Admitting: Internal Medicine

## 2014-03-05 DIAGNOSIS — I872 Venous insufficiency (chronic) (peripheral): Secondary | ICD-10-CM

## 2014-03-05 DIAGNOSIS — R4689 Other symptoms and signs involving appearance and behavior: Secondary | ICD-10-CM

## 2014-03-05 DIAGNOSIS — F0390 Unspecified dementia without behavioral disturbance: Secondary | ICD-10-CM

## 2014-03-05 DIAGNOSIS — C61 Malignant neoplasm of prostate: Secondary | ICD-10-CM

## 2014-03-05 DIAGNOSIS — I10 Essential (primary) hypertension: Secondary | ICD-10-CM

## 2014-03-05 DIAGNOSIS — I5032 Chronic diastolic (congestive) heart failure: Secondary | ICD-10-CM

## 2014-03-05 DIAGNOSIS — N184 Chronic kidney disease, stage 4 (severe): Secondary | ICD-10-CM

## 2014-03-05 DIAGNOSIS — E1129 Type 2 diabetes mellitus with other diabetic kidney complication: Secondary | ICD-10-CM

## 2014-03-05 DIAGNOSIS — G609 Hereditary and idiopathic neuropathy, unspecified: Secondary | ICD-10-CM

## 2014-03-05 DIAGNOSIS — IMO0002 Reserved for concepts with insufficient information to code with codable children: Secondary | ICD-10-CM

## 2014-03-05 DIAGNOSIS — S129XXD Fracture of neck, unspecified, subsequent encounter: Secondary | ICD-10-CM

## 2014-03-05 DIAGNOSIS — E1165 Type 2 diabetes mellitus with hyperglycemia: Secondary | ICD-10-CM

## 2014-03-05 DIAGNOSIS — D649 Anemia, unspecified: Secondary | ICD-10-CM

## 2014-03-05 NOTE — Progress Notes (Signed)
MRN: [id number] Name: Shane [name]  Sex: male Age: 80 y.o. DOB: Feb 27, 1917  [place] #: [facility] Facility/Room: 112 Level Of Care: SNF Provider: Inocencio Homes D Emergency Contacts: Extended Emergency Contact Information Primary Emergency Contact: Haslam,[name] Address: [address]          [place], [place] [address] [place] Phone: [phone] Relation: Spouse Secondary Emergency Contact: Ford,Bonita Address: [address]          [place], [place] [address] [place] Phone: [phone] Relation: Friend  Code Status:FULL   Allergies: Nifedipine  Chief Complaint  Patient presents with  . Discharge Note    HPI: Patient is 80 y.o. male who syncoped and sustained a C- spine fracture who is now ready to leave and go home.  Past Medical History  Diagnosis Date  . Dental caries   . Hypertension   . Aortic stenosis     Echocardiogram 12/20/11: Mild LVH, EF 44-81%, grade 1 diastolic dysfunction, mild aortic stenosis, mean gradient 13, AVA 1.39 cm, mild MAC and nodular sclerosis of the tip of the anterior leaflet, mild MR, mild to moderate LAE, mild TR.  [name] Peripheral vascular disease   . Venous insufficiency   . Diabetes mellitus   . Diverticulosis of colon   . History of colonic polyps   . Renal insufficiency   . Adenocarcinoma of prostate   . Chronic low back pain   . History of syncope   . Peripheral neuropathy   . Sebaceous cyst   . Anemia   . Hyperkalemia 12/19/2011  . Syncope     History reviewed. No pertinent past surgical history.    Medication List       This list is accurate as of: 03/05/14  3:33 PM.  Always use your most recent med list.               aspirin 81 MG tablet  Take 81 mg by mouth daily.     docusate sodium 100 MG capsule  Commonly known as:  COLACE  Take 100 mg by mouth daily.     ferrous sulfate 325 (65 FE) MG tablet  Take 325 mg by mouth daily.     finasteride 5 MG tablet  Commonly  known as:  PROSCAR  Take 5 mg by mouth daily.     furosemide 20 MG tablet  Commonly known as:  LASIX  Take 1 tablet (20 mg total) by mouth daily.     hydrALAZINE 50 MG tablet  Commonly known as:  APRESOLINE  Take 50 mg by mouth 3 (three) times daily.     insulin aspart 100 UNIT/ML injection  Commonly known as:  novoLOG  5 units with meals and 5 additional units if blood sugars above 150 with meals to equal 10 units     insulin glargine 100 UNIT/ML injection  Commonly known as:  LANTUS  Inject 0.06 mLs (6 Units total) into the skin daily.     isosorbide mononitrate 30 MG 24 hr tablet  Commonly known as:  IMDUR  TAKE ONE TABLET BY MOUTH ONE TIME DAILY     meclizine 25 MG tablet  Commonly known as:  ANTIVERT  Take 25 mg by mouth 3 (three) times daily as needed for dizziness.     MENS MULTIVITAMIN PLUS PO  Take 1 tablet by mouth daily.     NIFEdipine 30 MG 24 hr tablet  Commonly known as:  PROCARDIA-XL/ADALAT-CC/NIFEDICAL-XL  Take 30 mg by mouth 2 (  two) times daily.     predniSONE 10 MG tablet  Commonly known as:  DELTASONE  Take 10 mg by mouth daily.     senna-docusate 8.6-50 MG per tablet  Commonly known as:  Senokot-S  Take 1 tablet by mouth daily as needed for moderate constipation.     tamsulosin 0.4 MG Caps capsule  Commonly known as:  FLOMAX  Take 0.4 mg by mouth 2 (two) times daily.     vitamin B-12 500 MCG tablet  Commonly known as:  CYANOCOBALAMIN  Take 500 mcg by mouth daily.     vitamin C 500 MG tablet  Commonly known as:  ASCORBIC ACID  Take 500 mg by mouth daily.        Meds ordered this encounter  Medications  . docusate sodium (COLACE) 100 MG capsule    Sig: Take 100 mg by mouth daily.    Immunization History  Administered Date(s) Administered  . Influenza Split 04/13/2011, 03/23/2012  . Influenza Whole 05/11/2007, 05/07/2008, 05/23/2009, 07/30/2010  . Influenza,inj,Quad PF,36+ Mos 04/11/2013  . Pneumococcal Conjugate-13 09/27/2013  .  Tdap 07/13/2006    History  Substance Use Topics  . Smoking status: Former Smoker    Types: Cigars    Quit date: 07/13/1978  . Smokeless tobacco: Not on file     Comment: for 2-3 years  . Alcohol Use: No    Filed Vitals:   03/05/14 1516  BP: 117/57  Pulse: 53  Temp: 96.5 F (35.8 C)  Resp: 20    Physical Exam  GENERAL APPEARANCE: Alert, conversant. Appropriately groomed. No acute distress.  HEENT: Unremarkable. RESPIRATORY: Breathing is even, unlabored. Lung sounds are clear   CARDIOVASCULAR: Heart RRR no murmurs, rubs or gallops. 1+ peripheral edema wearing TED hose  GASTROINTESTINAL: Abdomen is soft, non-tender, not distended w/ normal bowel sounds.  NEUROLOGIC: Cranial nerves 2-12 grossly intact. Moves all extremities no tremor.  Patient Active Problem List   Diagnosis Date Noted  . Tremor 02/19/2014  . Unspecified constipation 02/19/2014  . BPH (benign prostatic hypertrophy) 01/13/2014  . UTI (urinary tract infection) 01/13/2014  . Cervical spine fracture 01/02/2014  . Syncope 01/01/2014  . Hyperlipidemia with target LDL less than 100 12/07/2013  . Non-compliant behavior 11/23/2013  . Edema 10/09/2013  . Venous insufficiency 10/09/2013  . Dementia 09/27/2013  . Chronic diastolic heart failure 87/86/7672  . DJD (degenerative joint disease) 01/18/2013  . CKD (chronic kidney disease) stage 4, GFR 15-29 ml/min 12/27/2011  . ADENOCARCINOMA, PROSTATE 11/09/2007  . Type II or unspecified type diabetes mellitus with renal manifestations, uncontrolled 11/09/2007  . PERIPHERAL NEUROPATHY 11/09/2007  . AORTIC STENOSIS 11/09/2007  . ANEMIA 05/17/2007  . HYPERTENSION 05/17/2007  . LOW BACK PAIN, CHRONIC 05/17/2007    CBC    Component Value Date/Time   WBC 6.3 01/04/2014 0428   RBC 4.14* 01/04/2014 0428   HGB 12.0* 01/04/2014 0428   HCT 37.2* 01/04/2014 0428   PLT 196 01/04/2014 0428   MCV 89.9 01/04/2014 0428   LYMPHSABS 0.9 01/04/2014 0428   MONOABS 0.8 01/04/2014  0428   EOSABS 0.0 01/04/2014 0428   BASOSABS 0.0 01/04/2014 0428    CMP     Component Value Date/Time   NA 139 01/03/2014 0605   K 4.9 01/03/2014 0605   CL 102 01/03/2014 0605   CO2 24 01/03/2014 0605   GLUCOSE 82 01/03/2014 0605   GLUCOSE 115* 05/18/2006 0803   BUN 35* 01/03/2014 0605   CREATININE 2.14* 01/03/2014 0947  CALCIUM 8.8 01/03/2014 0605   PROT 6.6 01/02/2014 0418   ALBUMIN 2.9* 01/02/2014 0418   AST 38* 01/02/2014 0418   ALT 28 01/02/2014 0418   ALKPHOS 77 01/02/2014 0418   BILITOT 0.3 01/02/2014 0418   GFRNONAA 25* 01/03/2014 0605   GFRAA 29* 01/03/2014 0605    Assessment and Plan  Pt is improved and stable for discharge to home with appropriate supporting services.  Hennie Duos, MD

## 2014-03-26 ENCOUNTER — Ambulatory Visit: Payer: Medicare Other | Admitting: Internal Medicine

## 2014-03-26 ENCOUNTER — Non-Acute Institutional Stay (SKILLED_NURSING_FACILITY): Payer: Medicare Other | Admitting: Internal Medicine

## 2014-03-26 ENCOUNTER — Encounter: Payer: Self-pay | Admitting: Internal Medicine

## 2014-03-26 DIAGNOSIS — I10 Essential (primary) hypertension: Secondary | ICD-10-CM

## 2014-03-26 DIAGNOSIS — Z0289 Encounter for other administrative examinations: Secondary | ICD-10-CM

## 2014-03-26 DIAGNOSIS — I5032 Chronic diastolic (congestive) heart failure: Secondary | ICD-10-CM

## 2014-03-26 NOTE — Assessment & Plan Note (Signed)
Good control on multiple meds for high BP and CHF

## 2014-03-26 NOTE — Assessment & Plan Note (Signed)
Pt appears very stable on current regimen. I think weight gain is due to improved appetite as he has no signs of fluid overload.

## 2014-03-26 NOTE — Progress Notes (Signed)
MRN: [id number] Name: Shane [name]  Sex: male Age: 79 y.o. DOB: [birth year]  [place] #: [facility] Facility/Room: 314B Level Of Care: SNF Provider: Inocencio Homes D Emergency Contacts: Extended Emergency Contact Information Primary Emergency Contact: Standifer,[name] Address: [address]          [place], [place] [address] [place] Phone: [phone] Relation: Spouse Secondary Emergency Contact: Ford,Bonita Address: [address]          [place],  [address] Johnnette Litter of [place] Phone: [phone] Relation: Friend   Allergies: Nifedipine  Chief Complaint  Patient presents with  . Medical Management of Chronic Issues    HPI: Patient is 80 y.o. male who has had > 10 pound weight gain in past month and I am assessing for whether it could be fluid overload vs nutritional.  Past Medical History  Diagnosis Date  . Dental caries   . Hypertension   . Aortic stenosis     Echocardiogram 12/20/11: Mild LVH, EF 93-71%, grade 1 diastolic dysfunction, mild aortic stenosis, mean gradient 13, AVA 1.39 cm, mild MAC and nodular sclerosis of the tip of the anterior leaflet, mild MR, mild to moderate LAE, mild TR.  [name] Peripheral vascular disease   . Venous insufficiency   . Diabetes mellitus   . Diverticulosis of colon   . History of colonic polyps   . Renal insufficiency   . Adenocarcinoma of prostate   . Chronic low back pain   . History of syncope   . Peripheral neuropathy   . Sebaceous cyst   . Anemia   . Hyperkalemia 12/19/2011  . Syncope     History reviewed. No pertinent past surgical history.    Medication List       This list is accurate as of: 03/26/14  1:54 PM.  Always use your most recent med list.               aspirin 81 MG tablet  Take 81 mg by mouth daily.     docusate sodium 100 MG capsule  Commonly known as:  COLACE  Take 100 mg by mouth daily.     ferrous sulfate 325 (65 FE) MG tablet  Take 325 mg by mouth daily.      finasteride 5 MG tablet  Commonly known as:  PROSCAR  Take 5 mg by mouth daily.     furosemide 20 MG tablet  Commonly known as:  LASIX  Take 1 tablet (20 mg total) by mouth daily.     hydrALAZINE 50 MG tablet  Commonly known as:  APRESOLINE  Take 50 mg by mouth 3 (three) times daily.     insulin aspart 100 UNIT/ML injection  Commonly known as:  novoLOG  5 units with meals and 5 additional units if blood sugars above 150 with meals to equal 10 units     insulin glargine 100 UNIT/ML injection  Commonly known as:  LANTUS  Inject 0.06 mLs (6 Units total) into the skin daily.     isosorbide mononitrate 30 MG 24 hr tablet  Commonly known as:  IMDUR  TAKE ONE TABLET BY MOUTH ONE TIME DAILY     meclizine 25 MG tablet  Commonly known as:  ANTIVERT  Take 25 mg by mouth 3 (three) times daily as needed for dizziness.     MENS MULTIVITAMIN PLUS PO  Take 1 tablet by mouth daily.     NIFEdipine 30 MG 24 hr tablet  Commonly known as:  PROCARDIA-XL/ADALAT-CC/NIFEDICAL-XL  Take 30 mg by mouth 2 (two) times daily.     predniSONE 10 MG tablet  Commonly known as:  DELTASONE  Take 10 mg by mouth daily.     senna-docusate 8.6-50 MG per tablet  Commonly known as:  Senokot-S  Take 1 tablet by mouth daily as needed for moderate constipation.     tamsulosin 0.4 MG Caps capsule  Commonly known as:  FLOMAX  Take 0.4 mg by mouth 2 (two) times daily.     vitamin B-12 500 MCG tablet  Commonly known as:  CYANOCOBALAMIN  Take 500 mcg by mouth daily.     vitamin C 500 MG tablet  Commonly known as:  ASCORBIC ACID  Take 500 mg by mouth daily.        No orders of the defined types were placed in this encounter.    Immunization History  Administered Date(s) Administered  . Influenza Split 04/13/2011, 03/23/2012  . Influenza Whole 05/11/2007, 05/07/2008, 05/23/2009, 07/30/2010  . Influenza,inj,Quad PF,36+ Mos 04/11/2013  . Pneumococcal Conjugate-13 09/27/2013  . Tdap 07/13/2006     History  Substance Use Topics  . Smoking status: Former Smoker    Types: Cigars    Quit date: 07/13/1978  . Smokeless tobacco: Not on file     Comment: for 2-3 years  . Alcohol Use: No    Review of Systems  DATA OBTAINED: from patient, nurse GENERAL: Feels well no fevers, fatigue, appetite has improved per nursing;pt eats 100% of everything SKIN: No itching, rash HEENT: No complaint RESPIRATORY: No cough, wheezing, SOB CARDIAC: No chest pain, palpitations, lower extremity edema  GI: No abdominal pain, No N/V/D or constipation, No heartburn or reflux  GU: No dysuria, frequency or urgency, or incontinence  MUSCULOSKELETAL: No unrelieved bone/joint pain NEUROLOGIC: No headache, dizziness or focal weakness PSYCHIATRIC: No overt anxiety or sadness. Sleeps well.   Filed Vitals:   03/26/14 1347  BP: 128/71  Pulse: 59  Temp: 97.6 F (36.4 C)  Resp: 18    Physical Exam  GENERAL APPEARANCE: Alert, conversant. Appropriately groomed. No acute distress  SKIN: No diaphoresis rash HEENT: Unremarkable RESPIRATORY: Breathing is even, unlabored. Lung sounds are slt coarse with good AF   CARDIOVASCULAR: Heart RRR no murmurs, rubs or gallops. trace peripheral edema  GASTROINTESTINAL: Abdomen is soft, non-tender, not distended w/ normal bowel sounds.  GENITOURINARY: Bladder non tender, not distended  MUSCULOSKELETAL: No abnormal joints or musculature NEUROLOGIC: Cranial nerves 2-12 grossly intact. Moves all extremities no tremor. PSYCHIATRIC: Mood and affect appropriate to situation, no behavioral issues  Patient Active Problem List   Diagnosis Date Noted  . Tremor 02/19/2014  . Unspecified constipation 02/19/2014  . BPH (benign prostatic hypertrophy) 01/13/2014  . UTI (urinary tract infection) 01/13/2014  . Cervical spine fracture 01/02/2014  . Syncope 01/01/2014  . Hyperlipidemia with target LDL less than 100 12/07/2013  . Non-compliant behavior 11/23/2013  . Edema  10/09/2013  . Venous insufficiency 10/09/2013  . Dementia 09/27/2013  . Chronic diastolic heart failure 45/80/9983  . DJD (degenerative joint disease) 01/18/2013  . CKD (chronic kidney disease) stage 4, GFR 15-29 ml/min 12/27/2011  . ADENOCARCINOMA, PROSTATE 11/09/2007  . Type II or unspecified type diabetes mellitus with renal manifestations, uncontrolled 11/09/2007  . PERIPHERAL NEUROPATHY 11/09/2007  . AORTIC STENOSIS 11/09/2007  . ANEMIA 05/17/2007  . HYPERTENSION 05/17/2007  . LOW BACK PAIN, CHRONIC 05/17/2007    CBC    Component Value Date/Time   WBC 6.3 01/04/2014 0428   RBC 4.14* 01/04/2014  0428   HGB 12.0* 01/04/2014 0428   HCT 37.2* 01/04/2014 0428   PLT 196 01/04/2014 0428   MCV 89.9 01/04/2014 0428   LYMPHSABS 0.9 01/04/2014 0428   MONOABS 0.8 01/04/2014 0428   EOSABS 0.0 01/04/2014 0428   BASOSABS 0.0 01/04/2014 0428    CMP     Component Value Date/Time   NA 139 01/03/2014 0605   K 4.9 01/03/2014 0605   CL 102 01/03/2014 0605   CO2 24 01/03/2014 0605   GLUCOSE 82 01/03/2014 0605   GLUCOSE 115* 05/18/2006 0803   BUN 35* 01/03/2014 0605   CREATININE 2.14* 01/03/2014 0605   CALCIUM 8.8 01/03/2014 0605   PROT 6.6 01/02/2014 0418   ALBUMIN 2.9* 01/02/2014 0418   AST 38* 01/02/2014 0418   ALT 28 01/02/2014 0418   ALKPHOS 77 01/02/2014 0418   BILITOT 0.3 01/02/2014 0418   GFRNONAA 25* 01/03/2014 0605   GFRAA 29* 01/03/2014 0605    Assessment and Plan  Chronic diastolic heart failure Pt appears very stable on current regimen. I think weight gain is due to improved appetite as he has no signs of fluid overload.  HYPERTENSION Good control on multiple meds for high BP and CHF    Hennie Duos, MD

## 2014-04-06 ENCOUNTER — Ambulatory Visit: Payer: Medicare Other | Admitting: Internal Medicine

## 2014-04-09 ENCOUNTER — Encounter: Payer: Self-pay | Admitting: Internal Medicine

## 2014-04-09 DIAGNOSIS — I5032 Chronic diastolic (congestive) heart failure: Secondary | ICD-10-CM

## 2014-04-09 DIAGNOSIS — M159 Polyosteoarthritis, unspecified: Secondary | ICD-10-CM

## 2014-04-09 DIAGNOSIS — N184 Chronic kidney disease, stage 4 (severe): Secondary | ICD-10-CM

## 2014-04-09 DIAGNOSIS — I10 Essential (primary) hypertension: Secondary | ICD-10-CM

## 2014-04-09 DIAGNOSIS — G609 Hereditary and idiopathic neuropathy, unspecified: Secondary | ICD-10-CM

## 2014-04-09 DIAGNOSIS — E1165 Type 2 diabetes mellitus with hyperglycemia: Secondary | ICD-10-CM

## 2014-04-09 DIAGNOSIS — D649 Anemia, unspecified: Secondary | ICD-10-CM

## 2014-04-09 DIAGNOSIS — E785 Hyperlipidemia, unspecified: Secondary | ICD-10-CM

## 2014-04-09 DIAGNOSIS — F0390 Unspecified dementia without behavioral disturbance: Secondary | ICD-10-CM

## 2014-04-09 DIAGNOSIS — E1129 Type 2 diabetes mellitus with other diabetic kidney complication: Secondary | ICD-10-CM

## 2014-04-09 DIAGNOSIS — I359 Nonrheumatic aortic valve disorder, unspecified: Secondary | ICD-10-CM

## 2014-04-09 DIAGNOSIS — M150 Primary generalized (osteo)arthritis: Secondary | ICD-10-CM

## 2014-04-09 NOTE — Progress Notes (Signed)
MRN: [id number] Name: Shane [name]  Sex: male Age: 80 y.o. DOB: [birth year]  [place] #: [facility] Facility/Room: 314B Level Of Care: SNF Provider: Inocencio Homes D Emergency Contacts: Extended Emergency Contact Information Primary Emergency Contact: Pippins,[name] Address: [address]          [place], [place] [address] [place] Phone: [phone] Relation: Spouse Secondary Emergency Contact: Ford,Bonita Address: [address]          [place], [place] [address] Johnnette Litter of [place] Phone: [phone] Relation: Friend  Code Status: FULL  Allergies: Nifedipine  Chief Complaint  Patient presents with  . Discharge Note    HPI: Patient is 80 y.o. male who  Past Medical History  Diagnosis Date  . Dental caries   . Hypertension   . Aortic stenosis     Echocardiogram 12/20/11: Mild LVH, EF 42-68%, grade 1 diastolic dysfunction, mild aortic stenosis, mean gradient 13, AVA 1.39 cm, mild MAC and nodular sclerosis of the tip of the anterior leaflet, mild MR, mild to moderate LAE, mild TR.  [name] Peripheral vascular disease   . Venous insufficiency   . Diabetes mellitus   . Diverticulosis of colon   . History of colonic polyps   . Renal insufficiency   . Adenocarcinoma of prostate   . Chronic low back pain   . History of syncope   . Peripheral neuropathy   . Sebaceous cyst   . Anemia   . Hyperkalemia 12/19/2011  . Syncope     History reviewed. No pertinent past surgical history.    Medication List       This list is accurate as of: 04/09/14  4:20 PM.  Always use your most recent med list.               aspirin 81 MG tablet  Take 81 mg by mouth daily.     docusate sodium 100 MG capsule  Commonly known as:  COLACE  Take 100 mg by mouth daily.     ferrous sulfate 325 (65 FE) MG tablet  Take 325 mg by mouth daily.     finasteride 5 MG tablet  Commonly known as:  PROSCAR  Take 5 mg by mouth daily.     furosemide 20 MG tablet   Commonly known as:  LASIX  Take 1 tablet (20 mg total) by mouth daily.     hydrALAZINE 50 MG tablet  Commonly known as:  APRESOLINE  Take 50 mg by mouth 3 (three) times daily.     insulin aspart 100 UNIT/ML injection  Commonly known as:  novoLOG  5 units with meals and 5 additional units if blood sugars above 150 with meals to equal 10 units     insulin glargine 100 UNIT/ML injection  Commonly known as:  LANTUS  Inject 0.06 mLs (6 Units total) into the skin daily.     isosorbide mononitrate 30 MG 24 hr tablet  Commonly known as:  IMDUR  TAKE ONE TABLET BY MOUTH ONE TIME DAILY     meclizine 25 MG tablet  Commonly known as:  ANTIVERT  Take 25 mg by mouth 3 (three) times daily as needed for dizziness.     MENS MULTIVITAMIN PLUS PO  Take 1 tablet by mouth daily.     NIFEdipine 30 MG 24 hr tablet  Commonly known as:  PROCARDIA-XL/ADALAT-CC/NIFEDICAL-XL  Take 30 mg by mouth 2 (two) times daily.     predniSONE 10 MG tablet  Commonly known as:  DELTASONE  Take 10 mg by mouth daily.     senna-docusate 8.6-50 MG per tablet  Commonly known as:  Senokot-S  Take 1 tablet by mouth daily as needed for moderate constipation.     tamsulosin 0.4 MG Caps capsule  Commonly known as:  FLOMAX  Take 0.4 mg by mouth 2 (two) times daily.     vitamin B-12 500 MCG tablet  Commonly known as:  CYANOCOBALAMIN  Take 500 mcg by mouth daily.     vitamin C 500 MG tablet  Commonly known as:  ASCORBIC ACID  Take 500 mg by mouth daily.        No orders of the defined types were placed in this encounter.    Immunization History  Administered Date(s) Administered  . Influenza Split 04/13/2011, 03/23/2012  . Influenza Whole 05/11/2007, 05/07/2008, 05/23/2009, 07/30/2010  . Influenza,inj,Quad PF,36+ Mos 04/11/2013  . Pneumococcal Conjugate-13 09/27/2013  . Tdap 07/13/2006    History  Substance Use Topics  . Smoking status: Former Smoker    Types: Cigars    Quit date: 07/13/1978  .  Smokeless tobacco: Not on file     Comment: for 2-3 years  . Alcohol Use: No    Filed Vitals:   04/09/14 1611  BP: 128/71  Pulse: 59  Temp: 97.6 F (36.4 C)  Resp: 18    Physical Exam  GENERAL APPEARANCE: Alert, conversant. No acute distress.  HEENT: Unremarkable. RESPIRATORY: Breathing is even, unlabored. Lung sounds are clear   CARDIOVASCULAR: Heart RRR no murmurs, rubs or gallops. No peripheral edema.  GASTROINTESTINAL: Abdomen is soft, non-tender, not distended w/ normal bowel sounds.  NEUROLOGIC: Cranial nerves 2-12 grossly intact. Moves all extremities  Patient Active Problem List   Diagnosis Date Noted  . Tremor 02/19/2014  . Unspecified constipation 02/19/2014  . BPH (benign prostatic hypertrophy) 01/13/2014  . UTI (urinary tract infection) 01/13/2014  . Cervical spine fracture 01/02/2014  . Syncope 01/01/2014  . Hyperlipidemia with target LDL less than 100 12/07/2013  . Non-compliant behavior 11/23/2013  . Edema 10/09/2013  . Venous insufficiency 10/09/2013  . Dementia 09/27/2013  . Chronic diastolic heart failure 21/19/4174  . DJD (degenerative joint disease) 01/18/2013  . CKD (chronic kidney disease) stage 4, GFR 15-29 ml/min 12/27/2011  . ADENOCARCINOMA, PROSTATE 11/09/2007  . Type II or unspecified type diabetes mellitus with renal manifestations, uncontrolled 11/09/2007  . PERIPHERAL NEUROPATHY 11/09/2007  . AORTIC STENOSIS 11/09/2007  . ANEMIA 05/17/2007  . HYPERTENSION 05/17/2007  . LOW BACK PAIN, CHRONIC 05/17/2007    CBC    Component Value Date/Time   WBC 6.3 01/04/2014 0428   RBC 4.14* 01/04/2014 0428   HGB 12.0* 01/04/2014 0428   HCT 37.2* 01/04/2014 0428   PLT 196 01/04/2014 0428   MCV 89.9 01/04/2014 0428   LYMPHSABS 0.9 01/04/2014 0428   MONOABS 0.8 01/04/2014 0428   EOSABS 0.0 01/04/2014 0428   BASOSABS 0.0 01/04/2014 0428    CMP     Component Value Date/Time   NA 139 01/03/2014 0605   K 4.9 01/03/2014 0605   CL 102 01/03/2014 0605   CO2  24 01/03/2014 0605   GLUCOSE 82 01/03/2014 0605   GLUCOSE 115* 05/18/2006 0803   BUN 35* 01/03/2014 0605   CREATININE 2.14* 01/03/2014 0605   CALCIUM 8.8 01/03/2014 0605   PROT 6.6 01/02/2014 0418   ALBUMIN 2.9* 01/02/2014 0418   AST 38* 01/02/2014 0418   ALT 28 01/02/2014 0418   ALKPHOS 77 01/02/2014 0418  BILITOT 0.3 01/02/2014 0418   GFRNONAA 25* 01/03/2014 0605   GFRAA 29* 01/03/2014 0160    Assessment and Plan  No problem-specific assessment & plan notes found for this encounter.   Hennie Duos, MD    This encounter was created in error - please disregard.

## 2014-04-19 ENCOUNTER — Telehealth: Payer: Self-pay | Admitting: Internal Medicine

## 2014-04-19 ENCOUNTER — Encounter: Payer: Self-pay | Admitting: Internal Medicine

## 2014-04-19 ENCOUNTER — Ambulatory Visit (INDEPENDENT_AMBULATORY_CARE_PROVIDER_SITE_OTHER): Payer: Medicare Other | Admitting: Internal Medicine

## 2014-04-19 ENCOUNTER — Other Ambulatory Visit (INDEPENDENT_AMBULATORY_CARE_PROVIDER_SITE_OTHER): Payer: Medicare Other

## 2014-04-19 VITALS — BP 138/72 | HR 58 | Temp 98.7°F | Resp 16 | Ht 66.0 in | Wt 165.0 lb

## 2014-04-19 DIAGNOSIS — S129XXD Fracture of neck, unspecified, subsequent encounter: Secondary | ICD-10-CM

## 2014-04-19 DIAGNOSIS — C61 Malignant neoplasm of prostate: Secondary | ICD-10-CM

## 2014-04-19 DIAGNOSIS — I5032 Chronic diastolic (congestive) heart failure: Secondary | ICD-10-CM

## 2014-04-19 DIAGNOSIS — I10 Essential (primary) hypertension: Secondary | ICD-10-CM

## 2014-04-19 DIAGNOSIS — D510 Vitamin B12 deficiency anemia due to intrinsic factor deficiency: Secondary | ICD-10-CM | POA: Diagnosis not present

## 2014-04-19 DIAGNOSIS — K5900 Constipation, unspecified: Secondary | ICD-10-CM

## 2014-04-19 DIAGNOSIS — G609 Hereditary and idiopathic neuropathy, unspecified: Secondary | ICD-10-CM

## 2014-04-19 DIAGNOSIS — N184 Chronic kidney disease, stage 4 (severe): Secondary | ICD-10-CM | POA: Diagnosis not present

## 2014-04-19 DIAGNOSIS — E118 Type 2 diabetes mellitus with unspecified complications: Secondary | ICD-10-CM

## 2014-04-19 LAB — COMPREHENSIVE METABOLIC PANEL
ALK PHOS: 71 U/L (ref 39–117)
ALT: 21 U/L (ref 0–53)
AST: 18 U/L (ref 0–37)
Albumin: 3 g/dL — ABNORMAL LOW (ref 3.5–5.2)
BUN: 48 mg/dL — ABNORMAL HIGH (ref 6–23)
CO2: 26 mEq/L (ref 19–32)
Calcium: 8.8 mg/dL (ref 8.4–10.5)
Chloride: 109 mEq/L (ref 96–112)
Creatinine, Ser: 2.4 mg/dL — ABNORMAL HIGH (ref 0.4–1.5)
GFR: 32.78 mL/min — ABNORMAL LOW (ref >60.00)
GLUCOSE: 125 mg/dL — AB (ref 70–99)
Potassium: 5.3 mEq/L — ABNORMAL HIGH (ref 3.5–5.1)
SODIUM: 144 meq/L (ref 135–145)
TOTAL PROTEIN: 6.9 g/dL (ref 6.0–8.3)
Total Bilirubin: 0.4 mg/dL (ref 0.2–1.2)

## 2014-04-19 LAB — CBC WITH DIFFERENTIAL/PLATELET
Basophils Absolute: 0 10*3/uL (ref 0.0–0.1)
Basophils Relative: 0.4 % (ref 0.0–3.0)
Eosinophils Absolute: 0.1 10*3/uL (ref 0.0–0.7)
Eosinophils Relative: 1.4 % (ref 0.0–5.0)
HCT: 34.9 % — ABNORMAL LOW (ref 39.0–52.0)
Hemoglobin: 11.3 g/dL — ABNORMAL LOW (ref 13.0–17.0)
Lymphocytes Relative: 18.7 % (ref 12.0–46.0)
Lymphs Abs: 1.2 10*3/uL (ref 0.7–4.0)
MCHC: 32.4 g/dL (ref 30.0–36.0)
MCV: 89.1 fl (ref 78.0–100.0)
Monocytes Absolute: 0.8 10*3/uL (ref 0.1–1.0)
Monocytes Relative: 12.1 % — ABNORMAL HIGH (ref 3.0–12.0)
Neutro Abs: 4.4 10*3/uL (ref 1.4–7.7)
Neutrophils Relative %: 67.4 % (ref 43.0–77.0)
PLATELETS: 231 10*3/uL (ref 150.0–400.0)
RBC: 3.91 Mil/uL — ABNORMAL LOW (ref 4.22–5.81)
RDW: 17.2 % — ABNORMAL HIGH (ref 11.5–15.5)
WBC: 6.6 10*3/uL (ref 4.0–10.5)

## 2014-04-19 LAB — TSH: TSH: 1.47 u[IU]/mL (ref 0.35–4.50)

## 2014-04-19 LAB — LIPID PANEL
Cholesterol: 186 mg/dL (ref 0–200)
HDL: 51.5 mg/dL (ref >39.00)
LDL CALC: 106 mg/dL — AB (ref 0–99)
NONHDL: 134.5
Total CHOL/HDL Ratio: 4
Triglycerides: 144 mg/dL (ref 0.0–149.0)
VLDL: 28.8 mg/dL (ref 0.0–40.0)

## 2014-04-19 LAB — IBC PANEL
Iron: 61 ug/dL (ref 42–165)
Saturation Ratios: 24.7 % (ref 20.0–50.0)
Transferrin: 176.1 mg/dL — ABNORMAL LOW (ref 212.0–360.0)

## 2014-04-19 LAB — HEMOGLOBIN A1C: Hgb A1c MFr Bld: 8.3 % — ABNORMAL HIGH (ref 4.6–6.5)

## 2014-04-19 MED ORDER — LINACLOTIDE 145 MCG PO CAPS: 145.0000 ug | ORAL_CAPSULE | Freq: Every day | ORAL | Status: DC

## 2014-04-19 NOTE — Assessment & Plan Note (Signed)
I will recheck his A1C and will address if needed

## 2014-04-19 NOTE — Telephone Encounter (Signed)
Frea,Daughter-in-Law/caregiver, wanted to let you know that [place] fax number to get commode is [phone].

## 2014-04-19 NOTE — Patient Instructions (Signed)

## 2014-04-19 NOTE — Progress Notes (Signed)
Pre visit review using our clinic review tool, if applicable. No additional management support is needed unless otherwise documented below in the visit note. 

## 2014-04-19 NOTE — Assessment & Plan Note (Signed)
The software blocked my ordered for B12 and folate Will recheck his CBC and his iron level

## 2014-04-19 NOTE — Assessment & Plan Note (Signed)
Will monitor the renal function for now He will not do HD

## 2014-04-19 NOTE — Progress Notes (Signed)
Subjective:    Patient ID: Shane [name], male    DOB: 1916-11-25, 80 y.o.   MRN: [id number]  Constipation This is a recurrent problem. The current episode started more than 1 month ago. The problem is unchanged. His stool frequency is 4 to 5 times per week. The stool is described as firm and formed. The patient is on a high fiber diet. He does not exercise regularly. There has been adequate water intake. Pertinent negatives include no abdominal pain, anorexia, back pain, bloating, diarrhea, difficulty urinating, fecal incontinence, fever, flatus, hematochezia, hemorrhoids, melena, nausea, rectal pain, vomiting or weight loss. Risk factors include immobility and recent illness. He has tried diet changes, fiber, laxatives and stool softeners for the symptoms. The treatment provided no relief. His past medical history is significant for neurologic disease.      Review of Systems  Constitutional: Negative.  Negative for fever, chills, weight loss, diaphoresis, appetite change and fatigue.  HENT: Negative.   Eyes: Negative.  Negative for visual disturbance.  Respiratory: Negative.  Negative for cough, choking, chest tightness, shortness of breath and stridor.   Cardiovascular: Negative.  Negative for chest pain, palpitations and leg swelling.  Gastrointestinal: Positive for constipation. Negative for nausea, vomiting, abdominal pain, diarrhea, blood in stool, melena, hematochezia, abdominal distention, anal bleeding, rectal pain, bloating, anorexia, flatus and hemorrhoids.  Endocrine: Negative.  Negative for polydipsia, polyphagia and polyuria.  Genitourinary: Negative.  Negative for difficulty urinating.  Musculoskeletal: Negative.  Negative for arthralgias, back pain, gait problem, joint swelling, myalgias, neck pain and neck stiffness.  Skin: Negative.  Negative for rash.  Allergic/Immunologic: Negative.   Neurological: Negative.  Negative for dizziness, tremors, weakness, light-headedness  and numbness.  Hematological: Negative.  Negative for adenopathy. Does not bruise/bleed easily.  Psychiatric/Behavioral: Negative.        Objective:   Physical Exam  Vitals reviewed. Constitutional: He appears well-developed and well-nourished. No distress.  HENT:  Head: Normocephalic and atraumatic.  Mouth/Throat: Oropharynx is clear and moist. No oropharyngeal exudate.  Eyes: Conjunctivae are normal. Right eye exhibits no discharge. Left eye exhibits no discharge. No scleral icterus.  Neck: Normal range of motion. Neck supple. No JVD present. No tracheal deviation present. No thyromegaly present.  Hard collar is in place  Cardiovascular: Normal rate, regular rhythm, S1 normal, S2 normal and intact distal pulses.  Exam reveals no gallop, no S3, no S4 and no friction rub.   Murmur heard.  Systolic murmur is present with a grade of 1/6   No diastolic murmur is present  Pulmonary/Chest: Effort normal and breath sounds normal. No stridor. No respiratory distress. He has no wheezes. He has no rales. He exhibits no tenderness.  Abdominal: Soft. Bowel sounds are normal. He exhibits no distension and no mass. There is no tenderness. There is no rebound and no guarding.  Musculoskeletal: Normal range of motion. He exhibits edema (2+ pitting edema in BLE). He exhibits no tenderness.  Lymphadenopathy:    He has no cervical adenopathy.  Skin: Skin is warm and dry. No rash noted. He is not diaphoretic. No erythema. No pallor.  Psychiatric: Judgment and thought content normal. His mood appears not anxious. His affect is not angry, not blunt, not labile and not inappropriate. His speech is delayed and tangential. He is slowed and withdrawn. Cognition and memory are impaired. He does not exhibit a depressed mood. He exhibits abnormal recent memory and abnormal remote memory. He is inattentive.    Lab Results  Component Value Date  WBC 6.3 01/04/2014   HGB 12.0* 01/04/2014   HCT 37.2* 01/04/2014    PLT 196 01/04/2014   GLUCOSE 82 01/03/2014   CHOL 186 12/07/2013   TRIG 109.0 12/07/2013   HDL 77.10 12/07/2013   LDLCALC 87 12/07/2013   ALT 28 01/02/2014   AST 38* 01/02/2014   NA 139 01/03/2014   K 4.9 01/03/2014   CL 102 01/03/2014   CREATININE 2.14* 01/03/2014   BUN 35* 01/03/2014   CO2 24 01/03/2014   TSH 0.839 01/01/2014   PSA 1.00 08/11/2013   INR 0.98 01/01/2014   HGBA1C 9.0* 01/02/2014   MICROALBUR 15.3* 11/09/2007        Assessment & Plan:

## 2014-04-19 NOTE — Telephone Encounter (Signed)
disregard

## 2014-04-19 NOTE — Telephone Encounter (Signed)
Shane [name] needs to know what the controlled solution for diabetic test strips.

## 2014-04-19 NOTE — Assessment & Plan Note (Signed)
I will check his labs to look for secondary causes Will try linzess for symptom relief

## 2014-04-19 NOTE — Assessment & Plan Note (Signed)
His BP is well controlled Will monitor his lytes and renal function 

## 2014-04-20 ENCOUNTER — Telehealth: Payer: Self-pay | Admitting: Internal Medicine

## 2014-04-20 MED ORDER — GLUCOSE BLOOD VI STRP: ORAL_STRIP | Status: DC

## 2014-04-20 MED ORDER — ONETOUCH LANCETS MISC: Status: DC

## 2014-04-20 MED ORDER — ONETOUCH ULTRA 2 W/DEVICE KIT: PACK | Status: DC

## 2014-04-20 NOTE — Telephone Encounter (Signed)
emmi emailed °

## 2014-04-20 NOTE — Telephone Encounter (Signed)
Rx for commode produced, pending signature and will fax off Monday once MD returns. Diabetic testing supplies also approved and sent to pharmacy.

## 2014-04-24 ENCOUNTER — Inpatient Hospital Stay: Payer: Medicare Other | Admitting: Internal Medicine

## 2014-04-30 ENCOUNTER — Telehealth: Payer: Self-pay | Admitting: Internal Medicine

## 2014-04-30 DIAGNOSIS — N183 Chronic kidney disease, stage 3 unspecified: Secondary | ICD-10-CM

## 2014-04-30 DIAGNOSIS — I10 Essential (primary) hypertension: Secondary | ICD-10-CM

## 2014-04-30 MED ORDER — NIFEDIPINE ER OSMOTIC RELEASE 30 MG PO TB24: 30.0000 mg | ORAL_TABLET | Freq: Two times a day (BID) | ORAL | Status: DC

## 2014-04-30 MED ORDER — FERROUS SULFATE 325 (65 FE) MG PO TABS: 325.0000 mg | ORAL_TABLET | Freq: Every day | ORAL | Status: AC

## 2014-04-30 MED ORDER — TAMSULOSIN HCL 0.4 MG PO CAPS: 0.4000 mg | ORAL_CAPSULE | Freq: Two times a day (BID) | ORAL | Status: DC

## 2014-04-30 MED ORDER — ISOSORBIDE MONONITRATE ER 30 MG PO TB24: ORAL_TABLET | ORAL | Status: DC

## 2014-04-30 MED ORDER — FUROSEMIDE 20 MG PO TABS: 20.0000 mg | ORAL_TABLET | Freq: Every day | ORAL | Status: DC

## 2014-04-30 NOTE — Telephone Encounter (Signed)
Pt needs refills nifedical xl/prozcar, and bp med

## 2014-04-30 NOTE — Telephone Encounter (Signed)
Patient need prescriptions for Isosorbide 30 mg, Ferrous sulfate 325 mg, Furosemide 20 mg, Prednisone 10 mg,.

## 2014-04-30 NOTE — Telephone Encounter (Signed)
Notified pt spoke with daughter refills has been sent back to [facility]...Johny Chess

## 2014-04-30 NOTE — Telephone Encounter (Signed)
Pt needs refill Flomax/[facility]/[address]

## 2014-04-30 NOTE — Telephone Encounter (Signed)
Notified daughter refills has been sent back to [facility]...Johny Chess

## 2014-05-08 ENCOUNTER — Telehealth: Payer: Self-pay | Admitting: Internal Medicine

## 2014-05-08 ENCOUNTER — Other Ambulatory Visit: Payer: Self-pay | Admitting: Internal Medicine

## 2014-05-08 DIAGNOSIS — I10 Essential (primary) hypertension: Secondary | ICD-10-CM

## 2014-05-08 DIAGNOSIS — N184 Chronic kidney disease, stage 4 (severe): Secondary | ICD-10-CM

## 2014-05-08 MED ORDER — HYDRALAZINE HCL 50 MG PO TABS: 50.0000 mg | ORAL_TABLET | Freq: Three times a day (TID) | ORAL | Status: DC

## 2014-05-08 MED ORDER — PREDNISONE 10 MG PO TABS: 10.0000 mg | ORAL_TABLET | Freq: Every day | ORAL | Status: DC

## 2014-05-08 NOTE — Telephone Encounter (Signed)
done

## 2014-05-08 NOTE — Telephone Encounter (Signed)
Notified pt daughter med has been sent to [facility]...Shane [name]

## 2014-05-08 NOTE — Telephone Encounter (Signed)
Pt daughter called in and said that pt was missing 2 meds the   -predniSONE (DELTASONE) 10 MG tablet [[id number]] -hydrialazine 5mg     She wants them sent to [facility]

## 2014-05-10 ENCOUNTER — Other Ambulatory Visit: Payer: Self-pay | Admitting: Neurosurgery

## 2014-05-10 DIAGNOSIS — S12111S Posterior displaced Type II dens fracture, sequela: Secondary | ICD-10-CM

## 2014-05-11 ENCOUNTER — Telehealth: Payer: Self-pay | Admitting: *Deleted

## 2014-05-11 MED ORDER — FINASTERIDE 5 MG PO TABS: 5.0000 mg | ORAL_TABLET | Freq: Every day | ORAL | Status: DC

## 2014-05-11 NOTE — Telephone Encounter (Signed)
Left msg on triage father needs refill on his finesteride. Called daughter no answer LMOM rx sent to [facility],,,/lmb

## 2014-05-14 ENCOUNTER — Encounter: Payer: Self-pay | Admitting: Internal Medicine

## 2014-05-14 ENCOUNTER — Telehealth: Payer: Self-pay | Admitting: Internal Medicine

## 2014-05-14 ENCOUNTER — Ambulatory Visit (INDEPENDENT_AMBULATORY_CARE_PROVIDER_SITE_OTHER): Payer: Medicare Other | Admitting: Internal Medicine

## 2014-05-14 VITALS — BP 118/56 | HR 59 | Temp 98.1°F | Resp 16

## 2014-05-14 DIAGNOSIS — I359 Nonrheumatic aortic valve disorder, unspecified: Secondary | ICD-10-CM

## 2014-05-14 DIAGNOSIS — M545 Low back pain, unspecified: Secondary | ICD-10-CM

## 2014-05-14 DIAGNOSIS — I10 Essential (primary) hypertension: Secondary | ICD-10-CM

## 2014-05-14 DIAGNOSIS — G609 Hereditary and idiopathic neuropathy, unspecified: Secondary | ICD-10-CM

## 2014-05-14 DIAGNOSIS — R609 Edema, unspecified: Secondary | ICD-10-CM

## 2014-05-14 DIAGNOSIS — I739 Peripheral vascular disease, unspecified: Secondary | ICD-10-CM

## 2014-05-14 NOTE — Patient Instructions (Signed)

## 2014-05-14 NOTE — Telephone Encounter (Signed)
States was in office today with dad.  States Dr. Ronnald Ramp filled out a form for diabetic shoes.  Daughter is requesting office notes from today to be faxed to company at [phone].

## 2014-05-14 NOTE — Progress Notes (Signed)
Subjective:    Patient ID: Shane [name], male    DOB: 1916/11/11, 80 y.o.   MRN: [id number]  Hypertension This is a chronic problem. The current episode started more than 1 year ago. The problem has been gradually improving since onset. The problem is controlled. Associated symptoms include malaise/fatigue and peripheral edema. Pertinent negatives include no anxiety, blurred vision, chest pain, headaches, neck pain, orthopnea, palpitations, PND, shortness of breath or sweats. Agents associated with hypertension include steroids. Past treatments include calcium channel blockers, diuretics and direct vasodilators. The current treatment provides significant improvement. Compliance problems include psychosocial issues, exercise and diet.  Hypertensive end-organ damage includes kidney disease, heart failure, left ventricular hypertrophy and PVD. Identifiable causes of hypertension include chronic renal disease.      Review of Systems  Constitutional: Positive for malaise/fatigue and fatigue. Negative for fever, chills, diaphoresis and appetite change.  HENT: Negative.   Eyes: Negative.  Negative for blurred vision.  Respiratory: Negative.  Negative for cough, choking, chest tightness, shortness of breath, wheezing and stridor.   Cardiovascular: Positive for leg swelling. Negative for chest pain, palpitations, orthopnea and PND.  Gastrointestinal: Negative.  Negative for nausea, vomiting, abdominal pain, diarrhea, constipation and blood in stool.  Endocrine: Negative.   Genitourinary: Negative.   Musculoskeletal: Positive for back pain. Negative for myalgias, joint swelling, arthralgias, gait problem and neck pain.  Skin: Negative.   Allergic/Immunologic: Negative.   Neurological: Negative.  Negative for dizziness, tremors, weakness, light-headedness, numbness and headaches.  Hematological: Negative.  Negative for adenopathy. Does not bruise/bleed easily.  Psychiatric/Behavioral: Negative.         Objective:   Physical Exam  Constitutional: He is oriented to person, place, and time. He appears well-developed and well-nourished. No distress.  Wheel chair bound  HENT:  Head: Normocephalic and atraumatic.  Mouth/Throat: Oropharynx is clear and moist. No oropharyngeal exudate.  Eyes: Conjunctivae are normal. Right eye exhibits no discharge. Left eye exhibits no discharge. No scleral icterus.  Neck: Normal range of motion. Neck supple. No JVD present. No tracheal deviation present. No thyromegaly present.  Cardiovascular: Normal rate, regular rhythm and intact distal pulses.  Exam reveals no gallop and no friction rub.   Murmur heard. Pulmonary/Chest: Effort normal and breath sounds normal. No stridor. No respiratory distress. He has no wheezes. He has no rales. He exhibits no tenderness.  Abdominal: Soft. Bowel sounds are normal. He exhibits no distension and no mass. There is no tenderness. There is no rebound and no guarding.  Musculoskeletal: Normal range of motion. He exhibits edema (1+ pitting edema in BLE). He exhibits no tenderness.       Right foot: There is swelling. There is normal range of motion, no tenderness, no bony tenderness, normal capillary refill, no crepitus, no deformity and no laceration.       Left foot: There is swelling. There is normal range of motion, no tenderness, no bony tenderness, normal capillary refill, no crepitus, no deformity and no laceration.  Lymphadenopathy:    He has no cervical adenopathy.  Neurological: He is oriented to person, place, and time.  Skin: Skin is warm and dry. No rash noted. He is not diaphoretic. No erythema. No pallor.  Psychiatric: He has a normal mood and affect. Judgment and thought content normal. His mood appears not anxious. His affect is not angry, not blunt, not labile and not inappropriate. His speech is delayed and tangential. He is slowed and withdrawn. Cognition and memory are impaired. He does not exhibit  a  depressed mood. He expresses no homicidal and no suicidal ideation. He expresses no suicidal plans and no homicidal plans. He is noncommunicative. He exhibits abnormal recent memory and abnormal remote memory. He is inattentive.  Vitals reviewed.   Lab Results  Component Value Date   WBC 6.6 04/19/2014   HGB 11.3* 04/19/2014   HCT 34.9* 04/19/2014   PLT 231.0 04/19/2014   GLUCOSE 125* 04/19/2014   CHOL 186 04/19/2014   TRIG 144.0 04/19/2014   HDL 51.50 04/19/2014   LDLCALC 106* 04/19/2014   ALT 21 04/19/2014   AST 18 04/19/2014   NA 144 04/19/2014   K 5.3* 04/19/2014   CL 109 04/19/2014   CREATININE 2.4* 04/19/2014   BUN 48* 04/19/2014   CO2 26 04/19/2014   TSH 1.47 04/19/2014   PSA 1.00 08/11/2013   INR 0.98 01/01/2014   HGBA1C 8.3* 04/19/2014   MICROALBUR 15.3* 11/09/2007        Assessment & Plan:

## 2014-05-14 NOTE — Progress Notes (Signed)
Pre visit review using our clinic review tool, if applicable. No additional management support is needed unless otherwise documented below in the visit note. 

## 2014-05-15 ENCOUNTER — Telehealth: Payer: Self-pay | Admitting: Internal Medicine

## 2014-05-15 ENCOUNTER — Ambulatory Visit
Admission: RE | Admit: 2014-05-15 | Discharge: 2014-05-15 | Disposition: A | Discharge status: Home / Self Care | Payer: Medicare Other | Source: Ambulatory Visit | Attending: Neurosurgery | Admitting: Neurosurgery

## 2014-05-15 DIAGNOSIS — M159 Polyosteoarthritis, unspecified: Secondary | ICD-10-CM

## 2014-05-15 DIAGNOSIS — S12111S Posterior displaced Type II dens fracture, sequela: Secondary | ICD-10-CM

## 2014-05-15 DIAGNOSIS — M150 Primary generalized (osteo)arthritis: Principal | ICD-10-CM

## 2014-05-15 MED ORDER — FENTANYL 12 MCG/HR TD PT72: 12.5000 ug | MEDICATED_PATCH | TRANSDERMAL | Status: DC

## 2014-05-15 NOTE — Telephone Encounter (Signed)
Called pt spoke with son Merceda Elks) gave md response. Place in cabinet...Shane [name]

## 2014-05-15 NOTE — Assessment & Plan Note (Signed)
The family agrees to elevated his legs more I will check an U/S to see if there is a DVT

## 2014-05-15 NOTE — Telephone Encounter (Signed)
Daughter is wanting to know what can dad take for pain instead of aspirin...Johny Chess

## 2014-05-15 NOTE — Telephone Encounter (Signed)
Daughter, Marzella Schlein called wanting to know what Jaycub can take for pain other the pain. Please advise

## 2014-05-15 NOTE — Telephone Encounter (Signed)
Try fentanyl patch

## 2014-05-15 NOTE — Assessment & Plan Note (Signed)
I have some concerns that there may be ischemia but capillary refill is good I have ordered an arterial flow study to see how severe this is The family is not sure that they would treat this even if indicated

## 2014-05-15 NOTE — Assessment & Plan Note (Signed)
His BP is well controlled 

## 2014-05-15 NOTE — Telephone Encounter (Signed)
Daughter will need to complete a medical record release with our medical records department allowing our office to submit request to company. Daughter notified via [employer]. Thanks

## 2014-05-16 ENCOUNTER — Telehealth: Payer: Self-pay | Admitting: Internal Medicine

## 2014-05-16 NOTE — Telephone Encounter (Signed)
States pain patch is not helping completely.  Is requesting to try wearing more than one patch.

## 2014-05-16 NOTE — Telephone Encounter (Signed)
Notified daughter-in-law Marzella Schlein) with md response...Johny Chess

## 2014-05-16 NOTE — Telephone Encounter (Signed)
ok 

## 2014-05-17 LAB — HM DIABETES EYE EXAM

## 2014-05-18 ENCOUNTER — Ambulatory Visit (HOSPITAL_COMMUNITY): Payer: Medicare Other | Attending: Internal Medicine | Admitting: Cardiology

## 2014-05-18 DIAGNOSIS — R609 Edema, unspecified: Secondary | ICD-10-CM | POA: Diagnosis present

## 2014-05-18 DIAGNOSIS — I739 Peripheral vascular disease, unspecified: Secondary | ICD-10-CM | POA: Diagnosis not present

## 2014-05-18 DIAGNOSIS — E119 Type 2 diabetes mellitus without complications: Secondary | ICD-10-CM | POA: Insufficient documentation

## 2014-05-18 DIAGNOSIS — I10 Essential (primary) hypertension: Secondary | ICD-10-CM | POA: Insufficient documentation

## 2014-05-18 NOTE — Progress Notes (Signed)
Bilateral lower extremity venous duplex

## 2014-05-21 ENCOUNTER — Ambulatory Visit (HOSPITAL_COMMUNITY): Payer: Medicare Other | Attending: Internal Medicine | Admitting: Cardiology

## 2014-05-21 ENCOUNTER — Other Ambulatory Visit (HOSPITAL_COMMUNITY): Payer: Self-pay | Admitting: Cardiology

## 2014-05-21 ENCOUNTER — Encounter: Payer: Self-pay | Admitting: Internal Medicine

## 2014-05-21 DIAGNOSIS — I10 Essential (primary) hypertension: Secondary | ICD-10-CM | POA: Diagnosis not present

## 2014-05-21 DIAGNOSIS — I739 Peripheral vascular disease, unspecified: Secondary | ICD-10-CM | POA: Diagnosis not present

## 2014-05-21 DIAGNOSIS — E119 Type 2 diabetes mellitus without complications: Secondary | ICD-10-CM | POA: Diagnosis not present

## 2014-05-21 NOTE — Progress Notes (Signed)
LEA Doppler and LEA Duplex performed

## 2014-05-22 ENCOUNTER — Encounter: Payer: Self-pay | Admitting: Internal Medicine

## 2014-05-22 ENCOUNTER — Other Ambulatory Visit: Payer: Self-pay | Admitting: Internal Medicine

## 2014-05-22 DIAGNOSIS — I739 Peripheral vascular disease, unspecified: Secondary | ICD-10-CM

## 2014-05-23 ENCOUNTER — Encounter (HOSPITAL_COMMUNITY): Payer: Medicare Other

## 2014-05-25 ENCOUNTER — Encounter (HOSPITAL_COMMUNITY): Payer: Medicare Other

## 2014-05-28 ENCOUNTER — Encounter: Payer: Self-pay | Admitting: Vascular Surgery

## 2014-05-29 ENCOUNTER — Telehealth: Payer: Self-pay | Admitting: *Deleted

## 2014-05-29 ENCOUNTER — Encounter: Payer: Medicare Other | Admitting: Vascular Surgery

## 2014-05-29 NOTE — Telephone Encounter (Signed)
Left detailed message on Lashonda's (PT [facility]) vm.

## 2014-05-29 NOTE — Telephone Encounter (Signed)
[name] from [employer] called office requesting verbal order for continued home PT and OT for patient. Please advise?

## 2014-05-29 NOTE — Telephone Encounter (Signed)
yes

## 2014-05-30 ENCOUNTER — Ambulatory Visit (INDEPENDENT_AMBULATORY_CARE_PROVIDER_SITE_OTHER): Payer: Medicare Other | Admitting: Vascular Surgery

## 2014-05-30 ENCOUNTER — Encounter: Payer: Self-pay | Admitting: Vascular Surgery

## 2014-05-30 VITALS — BP 136/50 | HR 57 | Resp 18 | Ht 66.0 in | Wt 163.4 lb

## 2014-05-30 DIAGNOSIS — I70219 Atherosclerosis of native arteries of extremities with intermittent claudication, unspecified extremity: Secondary | ICD-10-CM

## 2014-05-30 NOTE — Assessment & Plan Note (Signed)
Based on his physical exam and duplex findings, he has evidence of multilevel arterial occlusive disease bilaterally. Specifically he has infrainguinal arterial occlusive disease. However currently he has stable claudication with no evidence of rest pain, nonhealing ulcers, or critical limb ischemia. Given that he is 80 years old, certainly I would not recommend an aggressive approach to his vascular disease unless he developed a limb threatening problem. Fortunately he is not a smoker. I have encouraged him to stay as active as possible. I've ordered follow up ABIs in 1 year and I'll see him back at that time. He knows to call sooner if he has problems.

## 2014-05-30 NOTE — Progress Notes (Signed)
Patient ID: Shane [name], male   DOB: May 31, 1917, 80 y.o.   MRN: [id number]  Reason for Consult: New Evaluation and PVD   Referred by Shane Lima, MD  Subjective:     HPI:  Shane [name] is a 80 y.o. male who was referred for evaluation of peripheral vascular disease. He was having some leg pain which prompted a duplex scan which was done on 05/21/2014. This showed evidence of bilateral superficial femoral artery disease and also tibial artery occlusive disease bilaterally. The patient is not very active. He does live at home with some of his children and has help. He is ambulatory with a walker. He does describe some bilateral calf claudication. I do not get any clear-cut history of rest pain. He also describes "pain in his joints." I do not get any history of rest pain. He denies any history of nonhealing ulcers.  I have reviewed the records from Dr. Adah Salvage office. The patient does have essential hypertension which is been well controlled. The patient has also had some issues with edema.  Past Medical History  Diagnosis Date  . Dental caries   . Hypertension   . Aortic stenosis     Echocardiogram 12/20/11: Mild LVH, EF 73-42%, grade 1 diastolic dysfunction, mild aortic stenosis, mean gradient 13, AVA 1.39 cm, mild MAC and nodular sclerosis of the tip of the anterior leaflet, mild MR, mild to moderate LAE, mild TR.  [name] Peripheral vascular disease   . Venous insufficiency   . Diabetes mellitus   . Diverticulosis of colon   . History of colonic polyps   . Renal insufficiency   . Adenocarcinoma of prostate   . Chronic low back pain   . History of syncope   . Peripheral neuropathy   . Sebaceous cyst   . Anemia   . Hyperkalemia 12/19/2011  . Syncope   . CHF (congestive heart failure)    Family History  Problem Relation Age of Onset  . Hypertension    . Hypertension Mother   . Heart disease Mother    Past Surgical History  Procedure Laterality Date  . Appendectomy      Short Social History:  History  Substance Use Topics  . Smoking status: Former Smoker    Types: Cigars    Quit date: 07/13/1978  . Smokeless tobacco: Not on file     Comment: for 2-3 years  . Alcohol Use: No    Allergies  Allergen Reactions  . Nifedipine     edema    Current Outpatient Prescriptions  Medication Sig Dispense Refill  . aspirin 81 MG tablet Take 81 mg by mouth daily.    . Blood Glucose Monitoring Suppl (ONE TOUCH ULTRA 2) W/DEVICE KIT Test up to TID dx: E11.0 1 each 2  . fentaNYL (DURAGESIC - DOSED MCG/HR) 12 MCG/HR Place 1 patch (12.5 mcg total) onto the skin every 3 (three) days. 10 patch 0  . ferrous sulfate 325 (65 FE) MG tablet Take 1 tablet (325 mg total) by mouth daily. 90 tablet 2  . finasteride (PROSCAR) 5 MG tablet Take 1 tablet (5 mg total) by mouth daily. 90 tablet 1  . furosemide (LASIX) 20 MG tablet Take 1 tablet (20 mg total) by mouth daily. 90 tablet 2  . glucose blood (ONE TOUCH ULTRA TEST) test strip Test up to TID dx: E11.0 100 each 12  . hydrALAZINE (APRESOLINE) 50 MG tablet Take 1 tablet (50 mg total) by mouth 3 (three) times daily.  90 tablet 11  . insulin aspart (NOVOLOG) 100 UNIT/ML injection 5 units with meals and 5 additional units if blood sugars above 150 with meals to equal 10 units    . insulin glargine (LANTUS) 100 UNIT/ML injection Inject 0.06 mLs (6 Units total) into the skin daily. 10 mL 11  . isosorbide mononitrate (IMDUR) 30 MG 24 hr tablet TAKE ONE TABLET BY MOUTH ONE TIME DAILY 90 tablet 2  . Linaclotide (LINZESS) 145 MCG CAPS capsule Take 1 capsule (145 mcg total) by mouth daily. 30 capsule 11  . meclizine (ANTIVERT) 25 MG tablet Take 25 mg by mouth 3 (three) times daily as needed for dizziness.    . Multiple Vitamins-Minerals (MENS MULTIVITAMIN PLUS PO) Take 1 tablet by mouth daily.    [name] NIFEdipine (PROCARDIA-XL/ADALAT-CC/NIFEDICAL-XL) 30 MG 24 hr tablet Take 1 tablet (30 mg total) by mouth 2 (two) times daily. 180 tablet 2  .  ONE TOUCH LANCETS MISC Test up to TID dx: E11.0 200 each 3  . predniSONE (DELTASONE) 10 MG tablet Take 1 tablet (10 mg total) by mouth daily with breakfast. 90 tablet 1  . tamsulosin (FLOMAX) 0.4 MG CAPS capsule Take 1 capsule (0.4 mg total) by mouth 2 (two) times daily. 180 capsule 2  . vitamin B-12 (CYANOCOBALAMIN) 500 MCG tablet Take 500 mcg by mouth daily.      . vitamin C (ASCORBIC ACID) 500 MG tablet Take 500 mg by mouth daily.     No current facility-administered medications for this visit.    Review of Systems  Constitutional: Negative for chills and fever.  Eyes: Negative for loss of vision.  Respiratory: Negative for cough and wheezing.  Cardiovascular: Positive for dyspnea with exertion and leg swelling. Negative for chest pain, chest tightness, claudication, orthopnea and palpitations.  GI: Negative for blood in stool and vomiting.  GU: Negative for dysuria and hematuria.  Musculoskeletal: Negative for leg pain, joint pain and myalgias.  Skin: Negative for rash and wound.  Neurological: Positive for dizziness. Negative for speech difficulty.  Hematologic: Negative for bruises/bleeds easily. Psychiatric: Negative for depressed mood.        Objective:  Objective  Filed Vitals:   05/30/14 0948  BP: 136/50  Pulse: 57  Resp: 18  Height: 5' 6"  (1.676 m)  Weight: 163 lb 6.4 oz (74.118 kg)   Body mass index is 26.39 kg/(m^2).  Physical Exam  Constitutional: He is oriented to person, place, and time. He appears well-developed and well-nourished.  HENT:  Head: Normocephalic and atraumatic.  Neck: Neck supple. No JVD present. No thyromegaly present.  Cardiovascular: Normal rate and regular rhythm.  Exam reveals no friction rub.   Murmur heard.  Systolic murmur is present with a grade of 2/6  Pulses:      Femoral pulses are 2+ on the right side, and 2+ on the left side.      Popliteal pulses are 1+ on the right side, and 0 on the left side.       Dorsalis pedis pulses  are 0 on the right side, and 0 on the left side.       Posterior tibial pulses are 0 on the right side, and 0 on the left side.  I do not detect carotid bruits. He does have some left leg swelling which is chronic. This appears to be lymphedema.  Pulmonary/Chest: Breath sounds normal. He has no wheezes. He has no rales.  Abdominal: Soft. Bowel sounds are normal. There is no tenderness.  Musculoskeletal: Normal range of motion. He exhibits no edema.  Lymphadenopathy:    He has no cervical adenopathy.  Neurological: He is alert and oriented to person, place, and time. He has normal strength. No sensory deficit.  Skin: No lesion and no rash noted.  I do not see any ulcers on his feet.  Psychiatric: He has a normal mood and affect.    Data: ARTERIAL DOPPLER STUDY: I have reviewed the arterial duplex study which was done on 05/21/2014. This shows evidence of bilateral superficial femoral artery stenoses and also disease in the left popliteal artery. There is also diffuse tibial artery occlusive disease bilaterally. ABI on the right is 63%. ABI on the left is 61%.  Toe pressure on the right is 50 mmHg. Toe pressure on the left is 46 mmHg.  VENOUS DUPLEX SCAN: I have also reviewed the venous duplex can from 05/18/2014. This shows no evidence of DVT bilaterally. There is also no superficial venous thrombus or significant valvular incompetence.       Assessment/Plan:     Atherosclerosis of native arteries of extremity with intermittent claudication Based on his physical exam and duplex findings, he has evidence of multilevel arterial occlusive disease bilaterally. Specifically he has infrainguinal arterial occlusive disease. However currently he has stable claudication with no evidence of rest pain, nonhealing ulcers, or critical limb ischemia. Given that he is 80 years old, certainly I would not recommend an aggressive approach to his vascular disease unless he developed a limb threatening  problem. Fortunately he is not a smoker. I have encouraged him to stay as active as possible. I've ordered follow up ABIs in 1 year and I'll see him back at that time. He knows to call sooner if he has problems.    Angelia Mould MD [facility]

## 2014-06-01 ENCOUNTER — Encounter: Payer: Self-pay | Admitting: Internal Medicine

## 2014-06-08 ENCOUNTER — Emergency Department (HOSPITAL_COMMUNITY): Payer: Medicare Other

## 2014-06-08 ENCOUNTER — Encounter (HOSPITAL_COMMUNITY): Payer: Self-pay | Admitting: Emergency Medicine

## 2014-06-08 ENCOUNTER — Observation Stay (HOSPITAL_COMMUNITY): Payer: Medicare Other

## 2014-06-08 ENCOUNTER — Observation Stay (HOSPITAL_COMMUNITY)
Admission: EM | Admit: 2014-06-08 | Discharge: 2014-06-09 | Disposition: A | Discharge status: Home / Self Care | Payer: Medicare Other | Attending: Internal Medicine | Admitting: Internal Medicine

## 2014-06-08 DIAGNOSIS — S129XXD Fracture of neck, unspecified, subsequent encounter: Secondary | ICD-10-CM | POA: Diagnosis not present

## 2014-06-08 DIAGNOSIS — Z794 Long term (current) use of insulin: Secondary | ICD-10-CM | POA: Insufficient documentation

## 2014-06-08 DIAGNOSIS — I639 Cerebral infarction, unspecified: Secondary | ICD-10-CM

## 2014-06-08 DIAGNOSIS — C61 Malignant neoplasm of prostate: Secondary | ICD-10-CM | POA: Insufficient documentation

## 2014-06-08 DIAGNOSIS — Z888 Allergy status to other drugs, medicaments and biological substances status: Secondary | ICD-10-CM | POA: Insufficient documentation

## 2014-06-08 DIAGNOSIS — I10 Essential (primary) hypertension: Secondary | ICD-10-CM | POA: Diagnosis present

## 2014-06-08 DIAGNOSIS — I350 Nonrheumatic aortic (valve) stenosis: Secondary | ICD-10-CM | POA: Diagnosis not present

## 2014-06-08 DIAGNOSIS — I872 Venous insufficiency (chronic) (peripheral): Secondary | ICD-10-CM | POA: Insufficient documentation

## 2014-06-08 DIAGNOSIS — G459 Transient cerebral ischemic attack, unspecified: Secondary | ICD-10-CM | POA: Diagnosis not present

## 2014-06-08 DIAGNOSIS — G451 Carotid artery syndrome (hemispheric): Secondary | ICD-10-CM

## 2014-06-08 DIAGNOSIS — E119 Type 2 diabetes mellitus without complications: Secondary | ICD-10-CM | POA: Insufficient documentation

## 2014-06-08 DIAGNOSIS — I739 Peripheral vascular disease, unspecified: Secondary | ICD-10-CM | POA: Diagnosis not present

## 2014-06-08 DIAGNOSIS — E118 Type 2 diabetes mellitus with unspecified complications: Secondary | ICD-10-CM | POA: Diagnosis not present

## 2014-06-08 DIAGNOSIS — N184 Chronic kidney disease, stage 4 (severe): Secondary | ICD-10-CM | POA: Insufficient documentation

## 2014-06-08 DIAGNOSIS — I129 Hypertensive chronic kidney disease with stage 1 through stage 4 chronic kidney disease, or unspecified chronic kidney disease: Secondary | ICD-10-CM | POA: Insufficient documentation

## 2014-06-08 DIAGNOSIS — I5032 Chronic diastolic (congestive) heart failure: Secondary | ICD-10-CM | POA: Insufficient documentation

## 2014-06-08 DIAGNOSIS — Z7952 Long term (current) use of systemic steroids: Secondary | ICD-10-CM | POA: Insufficient documentation

## 2014-06-08 DIAGNOSIS — G629 Polyneuropathy, unspecified: Secondary | ICD-10-CM | POA: Diagnosis not present

## 2014-06-08 DIAGNOSIS — Z86010 Personal history of colonic polyps: Secondary | ICD-10-CM | POA: Insufficient documentation

## 2014-06-08 DIAGNOSIS — S129XXA Fracture of neck, unspecified, initial encounter: Secondary | ICD-10-CM | POA: Diagnosis present

## 2014-06-08 DIAGNOSIS — Z7982 Long term (current) use of aspirin: Secondary | ICD-10-CM | POA: Diagnosis not present

## 2014-06-08 LAB — APTT: APTT: 35 s (ref 24–37)

## 2014-06-08 LAB — COMPREHENSIVE METABOLIC PANEL
ALT: 22 U/L (ref 0–53)
ANION GAP: 14 (ref 5–15)
AST: 20 U/L (ref 0–37)
Albumin: 3.1 g/dL — ABNORMAL LOW (ref 3.5–5.2)
Alkaline Phosphatase: 83 U/L (ref 39–117)
BUN: 50 mg/dL — AB (ref 6–23)
CHLORIDE: 111 meq/L (ref 96–112)
CO2: 21 mEq/L (ref 19–32)
CREATININE: 2.72 mg/dL — AB (ref 0.50–1.35)
Calcium: 9.1 mg/dL (ref 8.4–10.5)
GFR calc Af Amer: 21 mL/min — ABNORMAL LOW (ref >90)
GFR, EST NON AFRICAN AMERICAN: 18 mL/min — AB (ref >90)
Glucose, Bld: 89 mg/dL (ref 70–99)
POTASSIUM: 5.1 meq/L (ref 3.7–5.3)
Sodium: 146 mEq/L (ref 137–147)
Total Protein: 6.6 g/dL (ref 6.0–8.3)

## 2014-06-08 LAB — DIFFERENTIAL
Basophils Absolute: 0 10*3/uL (ref 0.0–0.1)
Basophils Relative: 0 % (ref 0–1)
Eosinophils Absolute: 0 10*3/uL (ref 0.0–0.7)
Eosinophils Relative: 0 % (ref 0–5)
LYMPHS PCT: 14 % (ref 12–46)
Lymphs Abs: 0.9 10*3/uL (ref 0.7–4.0)
MONO ABS: 0.4 10*3/uL (ref 0.1–1.0)
MONOS PCT: 6 % (ref 3–12)
NEUTROS ABS: 5.5 10*3/uL (ref 1.7–7.7)
NEUTROS PCT: 80 % — AB (ref 43–77)

## 2014-06-08 LAB — PROTIME-INR
INR: 1.05 (ref 0.00–1.49)
Prothrombin Time: 13.8 seconds (ref 11.6–15.2)

## 2014-06-08 LAB — ETHANOL: Alcohol, Ethyl (B): <11 mg/dL (ref 0–11)

## 2014-06-08 LAB — CBC
HEMATOCRIT: 32.1 % — AB (ref 39.0–52.0)
HEMOGLOBIN: 10.2 g/dL — AB (ref 13.0–17.0)
MCH: 28.1 pg (ref 26.0–34.0)
MCHC: 31.8 g/dL (ref 30.0–36.0)
MCV: 88.4 fL (ref 78.0–100.0)
Platelets: 168 10*3/uL (ref 150–400)
RBC: 3.63 MIL/uL — AB (ref 4.22–5.81)
RDW: 15.9 % — ABNORMAL HIGH (ref 11.5–15.5)
WBC: 6.9 10*3/uL (ref 4.0–10.5)

## 2014-06-08 LAB — GLUCOSE, CAPILLARY
GLUCOSE-CAPILLARY: 56 mg/dL — AB (ref 70–99)
Glucose-Capillary: 91 mg/dL (ref 70–99)

## 2014-06-08 LAB — TROPONIN I: Troponin I: <0.3 ng/mL (ref <0.30)

## 2014-06-08 MED ORDER — SENNOSIDES-DOCUSATE SODIUM 8.6-50 MG PO TABS: 1.0000 | ORAL_TABLET | Freq: Every evening | ORAL | Status: DC | PRN

## 2014-06-08 MED ORDER — TAMSULOSIN HCL 0.4 MG PO CAPS
0.4000 mg | ORAL_CAPSULE | Freq: Two times a day (BID) | ORAL | Status: DC
  Administered 2014-06-09: 0.4 mg via ORAL
  Filled 2014-06-08: qty 1

## 2014-06-08 MED ORDER — HEPARIN SODIUM (PORCINE) 5000 UNIT/ML IJ SOLN
5000.0000 [IU] | Freq: Three times a day (TID) | INTRAMUSCULAR | Status: DC
  Administered 2014-06-08 – 2014-06-09 (×3): 5000 [IU] via SUBCUTANEOUS
  Filled 2014-06-08 (×3): qty 1

## 2014-06-08 MED ORDER — ASPIRIN 300 MG RE SUPP
300.0000 mg | Freq: Once | RECTAL | Status: AC
  Administered 2014-06-08: 300 mg via RECTAL
  Filled 2014-06-08: qty 1

## 2014-06-08 MED ORDER — ASPIRIN 325 MG PO TABS: 325.0000 mg | ORAL_TABLET | Freq: Once | ORAL | Status: DC

## 2014-06-08 MED ORDER — FERROUS SULFATE 325 (65 FE) MG PO TABS
325.0000 mg | ORAL_TABLET | Freq: Every day | ORAL | Status: DC
  Administered 2014-06-09: 325 mg via ORAL
  Filled 2014-06-08: qty 1

## 2014-06-08 MED ORDER — STROKE: EARLY STAGES OF RECOVERY BOOK
Freq: Once | Status: DC
  Filled 2014-06-08: qty 1

## 2014-06-08 MED ORDER — ASPIRIN EC 81 MG PO TBEC
81.0000 mg | DELAYED_RELEASE_TABLET | Freq: Every day | ORAL | Status: DC
  Administered 2014-06-09: 81 mg via ORAL
  Filled 2014-06-08: qty 1

## 2014-06-08 MED ORDER — HYDRALAZINE HCL 50 MG PO TABS
50.0000 mg | ORAL_TABLET | Freq: Three times a day (TID) | ORAL | Status: DC
  Administered 2014-06-09: 50 mg via ORAL
  Filled 2014-06-08: qty 1

## 2014-06-08 MED ORDER — LINACLOTIDE 145 MCG PO CAPS
145.0000 ug | ORAL_CAPSULE | Freq: Every day | ORAL | Status: DC
  Administered 2014-06-09: 145 ug via ORAL
  Filled 2014-06-08 (×2): qty 1

## 2014-06-08 MED ORDER — MECLIZINE HCL 12.5 MG PO TABS: 25.0000 mg | ORAL_TABLET | Freq: Three times a day (TID) | ORAL | Status: DC | PRN

## 2014-06-08 MED ORDER — FINASTERIDE 5 MG PO TABS
5.0000 mg | ORAL_TABLET | Freq: Every day | ORAL | Status: DC
  Administered 2014-06-09: 5 mg via ORAL
  Filled 2014-06-08: qty 1

## 2014-06-08 MED ORDER — NIFEDIPINE ER 30 MG PO TB24
30.0000 mg | ORAL_TABLET | Freq: Two times a day (BID) | ORAL | Status: DC
  Administered 2014-06-09: 30 mg via ORAL
  Filled 2014-06-08 (×5): qty 1

## 2014-06-08 MED ORDER — ISOSORBIDE MONONITRATE ER 30 MG PO TB24
30.0000 mg | ORAL_TABLET | Freq: Every day | ORAL | Status: DC
  Administered 2014-06-09: 30 mg via ORAL
  Filled 2014-06-08: qty 1

## 2014-06-08 MED ORDER — FENTANYL 12 MCG/HR TD PT72: 12.5000 ug | MEDICATED_PATCH | TRANSDERMAL | Status: DC

## 2014-06-08 MED ORDER — VITAMIN C 500 MG PO TABS
500.0000 mg | ORAL_TABLET | Freq: Every day | ORAL | Status: DC
  Administered 2014-06-09: 500 mg via ORAL
  Filled 2014-06-08: qty 1

## 2014-06-08 MED ORDER — SODIUM CHLORIDE 0.9 % IV SOLN: INTRAVENOUS | Status: AC

## 2014-06-08 MED ORDER — PREDNISONE 5 MG PO TABS
10.0000 mg | ORAL_TABLET | Freq: Every day | ORAL | Status: DC
  Administered 2014-06-09: 10 mg via ORAL
  Filled 2014-06-08: qty 2

## 2014-06-08 MED ORDER — CYANOCOBALAMIN 250 MCG PO TABS
500.0000 ug | ORAL_TABLET | Freq: Every day | ORAL | Status: DC
  Administered 2014-06-09: 500 ug via ORAL
  Filled 2014-06-08 (×2): qty 2

## 2014-06-08 NOTE — ED Notes (Signed)
Report attempted 

## 2014-06-08 NOTE — Progress Notes (Signed)
*  PRELIMINARY RESULTS* Vascular Ultrasound Carotid Duplex (Doppler) has been completed.  Findings suggest 1-39% internal carotid artery stenosis bilaterally. Vertebral arteries are patent with antegrade flow.  06/08/2014 6:25 PM Maudry Mayhew, RVT, RDCS, RDMS

## 2014-06-08 NOTE — Procedures (Signed)
ELECTROENCEPHALOGRAM REPORT   Patient: Shane [name]       Room #: 5X64 EEG No. ID: [id number] Age: 80 y.o.        Sex: male Referring Physician: Elgergawy Report Date:  06/08/2014        Interpreting Physician: Alexis Goodell D  History: Shane [name] is an 80 y.o. male with right sided neurological symptoms that resolved  Medications:  Scheduled: .  stroke: mapping our early stages of recovery book   Does not apply Once  . [START ON 06/09/2014] aspirin EC  81 mg Oral Daily  . aspirin  300 mg Rectal Once  . fentaNYL  12.5 mcg Transdermal Q72H  . ferrous sulfate  325 mg Oral Daily  . finasteride  5 mg Oral Daily  . heparin  5,000 Units Subcutaneous 3 times per day  . hydrALAZINE  50 mg Oral TID  . isosorbide mononitrate  30 mg Oral Daily  . [START ON 06/09/2014] Linaclotide  145 mcg Oral Daily  . NIFEdipine  30 mg Oral BID  . [START ON 06/09/2014] predniSONE  10 mg Oral Q breakfast  . tamsulosin  0.4 mg Oral BID  . [START ON 06/09/2014] vitamin B-12  500 mcg Oral Daily  . [START ON 06/09/2014] vitamin C  500 mg Oral Daily    Conditions of Recording:  This is a 16 channel EEG carried out with the patient in the awake and drowsy states.  Description:  The waking background activity consists of a low voltage, symmetrical, fairly well organized, 8 Hz alpha activity, seen from the parieto-occipital and posterior temporal regions.  Low voltage fast activity, poorly organized, is seen anteriorly and is at times superimposed on more posterior regions.  A mixture of theta and alpha rhythms are seen from the central and temporal regions. The patient drowses with slowing to irregular, low voltage theta and beta activity.   Stage II sleep is not obtained. No epileptiform activity is noted. Hyperventilation and intermittent photic stimulation were not performed.   IMPRESSION: This is a normal electroencephalogram.  No epileptiform activity is noted.   Alexis Goodell, MD [facility] [phone] 06/08/2014, 7:41 PM

## 2014-06-08 NOTE — H&P (Signed)
Patient Demographics  Shane [name], is a 80 y.o. male  MRN: [id number]   DOB - [birth year]  Admit Date - 06/08/2014  Outpatient Primary MD for the patient is [name], MD   With History of -  Past Medical History  Diagnosis Date  . Dental caries   . Hypertension   . Aortic stenosis     Echocardiogram 12/20/11: Mild LVH, EF 01-60%, grade 1 diastolic dysfunction, mild aortic stenosis, mean gradient 13, AVA 1.39 cm, mild MAC and nodular sclerosis of the tip of the anterior leaflet, mild MR, mild to moderate LAE, mild TR.  [name] Peripheral vascular disease   . Venous insufficiency   . Diabetes mellitus   . Diverticulosis of colon   . History of colonic polyps   . Renal insufficiency   . Adenocarcinoma of prostate   . Chronic low back pain   . History of syncope   . Peripheral neuropathy   . Sebaceous cyst   . Anemia   . Hyperkalemia 12/19/2011  . Syncope   . CHF (congestive heart failure)       Past Surgical History  Procedure Laterality Date  . Appendectomy      in for   Chief Complaint  Patient presents with  . Stroke Symptoms     HPI  Shane [name]  is a 80 y.o. male, with past medical history is significant for aortic stenosis, diastolic CHF, history of syncope, type 2 diabetes, stage 3-4 chronic kidney disease, presents with an episode of slurred speech, facial droop, drooling, and altered mental status, daughter-in-law at bedside gives the history, report patient woke up at 8 AM at his baseline, at 10 AM she noticed a episode of slurred speech, drooling and facial droop, thereafter patient had a brief episode of eyes rolling back, and some tremors, by that time patient presented to ED, most of his symptoms has been improved, but daughter-in-law reports he still have some residual slurred speech, but has no significant focal deficits, CT head did not show any acute intracranial abnormalities, patient did not pass his bedside swallow evaluation, so he was ordered  aspirin per rectum.    Review of Systems    In addition to the HPI above,  No Fever-chills, No Headache, No changes with Vision or hearing, No problems swallowing food or Liquids, No Chest pain, Cough or Shortness of Breath, No Abdominal pain, No Nausea or Vommitting, Bowel movements are regular, No Blood in stool or Urine, No dysuria, No new skin rashes or bruises, No new joints pains-aches,  Denies any focal deficits, but has slurred speech, cannot recall this episode. No recent weight gain or loss, No polyuria, polydypsia or polyphagia, No significant Mental Stressors.  A full 10 point Review of Systems was done, except as stated above, all other Review of Systems were negative.   Social History History  Substance Use Topics  . Smoking status: Former Smoker    Types: Cigars    Quit date: 07/13/1978  . Smokeless tobacco: Not on file     Comment: for 2-3 years  . Alcohol Use: No     Family History Family History  Problem Relation Age of Onset  . Hypertension    . Hypertension Mother   . Heart disease Mother      Prior to Admission medications   Medication Sig Start Date End Date Taking? Authorizing Provider  aspirin 81 MG tablet Take 81 mg by mouth daily.   Yes Historical Provider, MD  Blood Glucose Monitoring Suppl (ONE TOUCH ULTRA 2) W/DEVICE KIT Test up to TID dx: E11.0 04/20/14  Yes Janith Lima, MD  fentaNYL (DURAGESIC - DOSED MCG/HR) 12 MCG/HR Place 1 patch (12.5 mcg total) onto the skin every 3 (three) days. 05/15/14  Yes Janith Lima, MD  ferrous sulfate 325 (65 FE) MG tablet Take 1 tablet (325 mg total) by mouth daily. 04/30/14  Yes Janith Lima, MD  finasteride (PROSCAR) 5 MG tablet Take 1 tablet (5 mg total) by mouth daily. 05/11/14  Yes Janith Lima, MD  furosemide (LASIX) 20 MG tablet Take 1 tablet (20 mg total) by mouth daily. 04/30/14  Yes Janith Lima, MD  glucose blood (ONE TOUCH ULTRA TEST) test strip Test up to TID dx: E11.0 04/20/14   Yes Janith Lima, MD  hydrALAZINE (APRESOLINE) 50 MG tablet Take 1 tablet (50 mg total) by mouth 3 (three) times daily. 05/08/14  Yes Janith Lima, MD  insulin aspart (NOVOLOG) 100 UNIT/ML injection 5 units with meals and 5 additional units if blood sugars above 150 with meals to equal 10 units 01/04/14  Yes Thurnell Lose, MD  insulin glargine (LANTUS) 100 UNIT/ML injection Inject 0.06 mLs (6 Units total) into the skin daily. 01/04/14  Yes Thurnell Lose, MD  isosorbide mononitrate (IMDUR) 30 MG 24 hr tablet TAKE ONE TABLET BY MOUTH ONE TIME DAILY Patient taking differently: Take 30 mg by mouth daily.  04/30/14  Yes Janith Lima, MD  Linaclotide [facility]) 145 MCG CAPS capsule Take 1 capsule (145 mcg total) by mouth daily. 04/19/14  Yes Janith Lima, MD  meclizine (ANTIVERT) 25 MG tablet Take 25 mg by mouth 3 (three) times daily as needed for dizziness.   Yes Historical Provider, MD  Multiple Vitamins-Minerals (MENS MULTIVITAMIN PLUS PO) Take 1 tablet by mouth daily.   Yes Historical Provider, MD  NIFEdipine (PROCARDIA-XL/ADALAT-CC/NIFEDICAL-XL) 30 MG 24 hr tablet Take 1 tablet (30 mg total) by mouth 2 (two) times daily. 04/30/14  Yes Janith Lima, MD  ONE TOUCH LANCETS MISC Test up to TID dx: E11.0 04/20/14  Yes Janith Lima, MD  predniSONE (DELTASONE) 10 MG tablet Take 1 tablet (10 mg total) by mouth daily with breakfast. 05/08/14  Yes Janith Lima, MD  tamsulosin (FLOMAX) 0.4 MG CAPS capsule Take 1 capsule (0.4 mg total) by mouth 2 (two) times daily. 04/30/14  Yes Janith Lima, MD  vitamin B-12 (CYANOCOBALAMIN) 500 MCG tablet Take 500 mcg by mouth daily.     Yes Historical Provider, MD  vitamin C (ASCORBIC ACID) 500 MG tablet Take 500 mg by mouth daily.   Yes Historical Provider, MD    Allergies  Allergen Reactions  . Nifedipine     edema    Physical Exam  Vitals  Blood pressure 150/56, pulse 54, temperature 97.7 F (36.5 C), temperature source Oral, resp. rate 16, SpO2  96 %.   1. General frail elderly male lying in bed in NAD,  *  2. Normal affect and insight, Not Suicidal or Homicidal, Awake Alert, Oriented X 3.  3. No F.N deficits besides significant slurred speech, moves all extremities without significant deficits, Sensation intact all 4 extremities, Plantars down going,  4. Ears and Eyes appear Normal, Conjunctivae clear, PERRLA. Moist Oral Mucosa.  5. Supple Neck, No JVD, No cervical lymphadenopathy appriciated, No Carotid Bruits.  6. Symmetrical Chest wall movement, Good air movement bilaterally, CTAB.  7. RRR, No Gallops, Rubs or  Murmurs, No Parasternal Heave.  8. Positive Bowel Sounds, Abdomen Soft, No tenderness, No organomegaly appriciated,No rebound -guarding or rigidity.  9.  No Cyanosis, Normal Skin Turgor, No Skin Rash or Bruise.  10. Good muscle tone,  joints appear normal , no effusions, Normal ROM.  11. No Palpable Lymph Nodes in Neck or Axillae    Data Review  CBC  Recent Labs Lab 06/08/14 1230  WBC 6.9  HGB 10.2*  HCT 32.1*  PLT 168  MCV 88.4  MCH 28.1  MCHC 31.8  RDW 15.9*  LYMPHSABS 0.9  MONOABS 0.4  EOSABS 0.0  BASOSABS 0.0   ------------------------------------------------------------------------------------------------------------------  Chemistries   Recent Labs Lab 06/08/14 1230  NA 146  K 5.1  CL 111  CO2 21  GLUCOSE 89  BUN 50*  CREATININE 2.72*  CALCIUM 9.1  AST 20  ALT 22  ALKPHOS 83  BILITOT <0.2*   ------------------------------------------------------------------------------------------------------------------ estimated creatinine clearance is 14.7 mL/min (by C-G formula based on Cr of 2.72). ------------------------------------------------------------------------------------------------------------------ No results for input(s): TSH, T4TOTAL, T3FREE, THYROIDAB in the last 72 hours.  Invalid input(s): FREET3   Coagulation profile  Recent Labs Lab 06/08/14 1230  INR  1.05   ------------------------------------------------------------------------------------------------------------------- No results for input(s): DDIMER in the last 72 hours. -------------------------------------------------------------------------------------------------------------------  Cardiac Enzymes  Recent Labs Lab 06/08/14 1230  TROPONINI <0.30   ------------------------------------------------------------------------------------------------------------------ Invalid input(s): POCBNP   ---------------------------------------------------------------------------------------------------------------  Urinalysis    Component Value Date/Time   COLORURINE YELLOW 01/01/2014 Alpha 01/01/2014 1551   LABSPEC 1.012 01/01/2014 1551   PHURINE 5.5 01/01/2014 1551   GLUCOSEU NEGATIVE 01/01/2014 1551   GLUCOSEU NEGATIVE 03/31/2010 1520   HGBUR SMALL* 01/01/2014 1551   BILIRUBINUR NEGATIVE 01/01/2014 1551   KETONESUR NEGATIVE 01/01/2014 1551   PROTEINUR NEGATIVE 01/01/2014 1551   UROBILINOGEN 0.2 01/01/2014 1551   NITRITE NEGATIVE 01/01/2014 1551   LEUKOCYTESUR TRACE* 01/01/2014 1551    ----------------------------------------------------------------------------------------------------------------  Imaging results:   Ct Head Wo Contrast  06/08/2014   CLINICAL DATA:  Awoke this morning with garbled speech, hard understand, somnolence, not alert as normal, possible LEFT side facial droop, history Alzheimer's dementia, short-term memory loss, hypertension, diabetes, CHF  EXAM: CT HEAD WITHOUT CONTRAST  TECHNIQUE: Contiguous axial images were obtained from the base of the skull through the vertex without intravenous contrast.  COMPARISON:  01/01/2014  FINDINGS: Generalized atrophy.  Ex vacuo dilatation of the ventricular system.  No midline shift or mass effect.  Small vessel chronic ischemic changes of deep cerebral white matter.  No intracranial hemorrhage, mass  lesion, or evidence of the acute infarction.  No extra-axial fluid collections.  Chronic sinusitis involving the RIGHT sphenoid sinus with osseous thickening and sclerosis.  Atherosclerotic calcifications of the carotid siphons bilaterally.  No new bone or sinus abnormality.  IMPRESSION: Generalized atrophy with small vessel chronic ischemic changes of deep cerebral white matter.  No acute intracranial abnormalities.   Electronically Signed   By: Lavonia Dana M.D.   On: 06/08/2014 13:10    My personal review of EKG: Rhythm NSR, Rate  57 /min, QTc 444 , no Acute ST changes    Assessment & Plan  Principal Problem:   TIA (transient ischemic attack) Active Problems:   ADENOCARCINOMA, PROSTATE   Diabetes mellitus type 2, controlled, with complications   Essential hypertension   Chronic diastolic heart failure   Cervical spine fracture    TIA/CVA -Patient presents with slurred speech, drooling, no acute finding in CT head, description per family as well suspicious  for seizures. -Patient will be admitted to telemetry floor, will obtain MRI brain without contrast MRA head, carotid Dopplers, 2-D echo, will continue with aspirin, check lipid panel and hemoglobin A1c. -We'll check EEG. -Nephrology were consulted by ED.  Diabetes mellitus -Given the fact patient is nothing by mouth, as he did not pass his swallow evaluation, hold his Lantus, we'll keep him on fingersticks every 6 hours, and if needed will start him on insulin sliding scale.  Acute on Chronic renal failure -Patient has slight worsening of his renal failure, we'll hold his Lasix, will hydrate gently given his known history of diastolic CHF.   Diastolic CHF -Patient appears euvolemic, will monitor closely as patient is on gentle hydration  History of cervical spine fracture -Continue with soft neck collar.  Hypertension -Resume home medication  Patient is on prednisone 10 mg oral daily, and they are not clear why patient is  on prednisone, but they report he has been on it for extended period of time.  DVT Prophylaxis Heparin -  AM Labs Ordered, also please review Full Orders  Family Communication: Admission, patients condition and plan of care including tests being ordered have been discussed with the patient and daughter-in-law who indicate understanding and agree with the plan and Code Status.  Code Status DNR Disposition; admit to telemetry  Condition GUARDED    Time spent in minutes : 60 minutes    ,  M.D on 06/08/2014 at 3:50 PM  Between 7am to 7pm - Pager - [phone]  After 7pm go to [email or web address] - password [email or web address]  And look for the night coverage person covering me after hours  [facility] Office  [phone]   **Disclaimer: This note may have been dictated with voice recognition software. Similar sounding words can inadvertently be transcribed and this note may contain transcription errors which may not have been corrected upon publication of note.**

## 2014-06-08 NOTE — Consult Note (Signed)
Referring Physician: Dr. Venora Maples    Chief Complaint: right face droop, dysarthria, drooling right side of his mouth  HPI:                                                                                                                                         Shane [name] is an 80 y.o. male, right handed, with a past medical history that is relevant for HTN, aortic stenosis, chronic congestive heart failure, DM, syncope, and peripheral neuropathy, brought in by EMS after sustaining acute onset of the above stated symptoms. He doesn't recall what really occurred today, but one of his family member is at the bedside and said that " he just couldn't talk for about one hour". As per EMS, patient had right face weakness and was slurring words. This morning woke up with speech garbled, hard to understand, very sleepy. Symptoms had resolved by the time he was evaluated in the ED. CT brain showed no acute intracranial abnormality. He denies HA, vertigo, double vision, difficulty swallowing, foal weakness or numbness, slurred speech, language or vision impairment. Date last known well: 06/07/14 Time last known well: unable to determine tPA Given: no, symptoms resolved    Past Medical History  Diagnosis Date  . Dental caries   . Hypertension   . Aortic stenosis     Echocardiogram 12/20/11: Mild LVH, EF 10-27%, grade 1 diastolic dysfunction, mild aortic stenosis, mean gradient 13, AVA 1.39 cm, mild MAC and nodular sclerosis of the tip of the anterior leaflet, mild MR, mild to moderate LAE, mild TR.  [name] Peripheral vascular disease   . Venous insufficiency   . Diabetes mellitus   . Diverticulosis of colon   . History of colonic polyps   . Renal insufficiency   . Adenocarcinoma of prostate   . Chronic low back pain   . History of syncope   . Peripheral neuropathy   . Sebaceous cyst   . Anemia   . Hyperkalemia 12/19/2011  . Syncope   . CHF (congestive heart failure)     Past Surgical History   Procedure Laterality Date  . Appendectomy      Family History  Problem Relation Age of Onset  . Hypertension    . Hypertension Mother   . Heart disease Mother    Social History:  reports that he quit smoking about 35 years ago. His smoking use included Cigars. He does not have any smokeless tobacco history on file. He reports that he does not drink alcohol or use illicit drugs.  Allergies:  Allergies  Allergen Reactions  . Nifedipine     edema    Medications:  Scheduled: .  stroke: mapping our early stages of recovery book   Does not apply Once  . [START ON 06/09/2014] aspirin EC  81 mg Oral Daily  . aspirin  300 mg Rectal Once  . fentaNYL  12.5 mcg Transdermal Q72H  . ferrous sulfate  325 mg Oral Daily  . finasteride  5 mg Oral Daily  . heparin  5,000 Units Subcutaneous 3 times per day  . hydrALAZINE  50 mg Oral TID  . isosorbide mononitrate  30 mg Oral Daily  . [START ON 06/09/2014] Linaclotide  145 mcg Oral Daily  . NIFEdipine  30 mg Oral BID  . [START ON 06/09/2014] predniSONE  10 mg Oral Q breakfast  . tamsulosin  0.4 mg Oral BID  . [START ON 06/09/2014] vitamin B-12  500 mcg Oral Daily  . [START ON 06/09/2014] vitamin C  500 mg Oral Daily    ROS:                                                                                                                                       History obtained from chart review and family  General ROS: negative for - chills, fatigue, fever, night sweats, weight gain or weight loss Psychological ROS: negative for - behavioral disorder, hallucinations, mood swings or suicidal ideation Ophthalmic ROS: negative for - blurry vision, double vision, eye pain or loss of vision ENT ROS: negative for - epistaxis, nasal discharge, oral lesions, sore throat, tinnitus or vertigo Allergy and Immunology ROS: negative for -  hives or itchy/watery eyes Hematological and Lymphatic ROS: negative for - bleeding problems, bruising or swollen lymph nodes Endocrine ROS: negative for - galactorrhea, hair pattern changes, polydipsia/polyuria or temperature intolerance Respiratory ROS: negative for - cough, hemoptysis, shortness of breath or wheezing Cardiovascular ROS: negative for - chest pain, dyspnea on exertion, edema or irregular heartbeat Gastrointestinal ROS: negative for - abdominal pain, diarrhea, hematemesis, nausea/vomiting or stool incontinence Genito-Urinary ROS: negative for - dysuria, hematuria, incontinence or urinary frequency/urgency Musculoskeletal ROS: negative for - joint swelling or muscular weakness Neurological ROS: as noted in HPI Dermatological ROS: negative for rash and skin lesion changes  Physical exam: pleasant male in no apparent distress. Blood pressure 150/56, pulse 54, temperature 97.7 F (36.5 C), temperature source Oral, resp. rate 16, SpO2 96 %. Head: normocephalic. Neck: supple, no bruits, no JVD. Cardiac: no murmurs. Lungs: clear. Abdomen: soft, no tender, no mass. Extremities: no edema. Neurologic Examination:  General: Mental Status: Alert, oriented to place-month-situation.  Speech fluent without evidence of aphasia.  Able to follow 3 step commands without difficulty. Cranial Nerves: II: Discs flat bilaterally; Visual fields grossly normal, pupils equal, round, reactive to light and accommodation III,IV, VI: ptosis not present, extra-ocular motions intact bilaterally V,VII: smile symmetric, facial light touch sensation normal bilaterally VIII: hearing normal bilaterally IX,X: gag reflex present XI: bilateral shoulder shrug XII: midline tongue extension without atrophy or fasciculations Motor: Right : Upper extremity   5/5    Left:     Upper extremity   5/5  Lower extremity    5/5     Lower extremity   5/5 Tone and bulk:normal tone throughout; no atrophy noted Sensory: Pinprick and light touch intact throughout, bilaterally Deep Tendon Reflexes:  1+ al over  Plantars: Right: downgoing   Left: downgoing Cerebellar: normal finger-to-nose,  normal heel-to-shin test Gait:  No tested due to safety reasons    Results for orders placed or performed during the hospital encounter of 06/08/14 (from the past 48 hour(s))  Ethanol     Status: None   Collection Time: 06/08/14 12:30 PM  Result Value Ref Range   Alcohol, Ethyl (B) <11 0 - 11 mg/dL    Comment:        LOWEST DETECTABLE LIMIT FOR SERUM ALCOHOL IS 11 mg/dL FOR MEDICAL PURPOSES ONLY   Protime-INR     Status: None   Collection Time: 06/08/14 12:30 PM  Result Value Ref Range   Prothrombin Time 13.8 11.6 - 15.2 seconds   INR 1.05 0.00 - 1.49  APTT     Status: None   Collection Time: 06/08/14 12:30 PM  Result Value Ref Range   aPTT 35 24 - 37 seconds  CBC     Status: Abnormal   Collection Time: 06/08/14 12:30 PM  Result Value Ref Range   WBC 6.9 4.0 - 10.5 K/uL   RBC 3.63 (L) 4.22 - 5.81 MIL/uL   Hemoglobin 10.2 (L) 13.0 - 17.0 g/dL   HCT 32.1 (L) 39.0 - 52.0 %   MCV 88.4 78.0 - 100.0 fL   MCH 28.1 26.0 - 34.0 pg   MCHC 31.8 30.0 - 36.0 g/dL   RDW 15.9 (H) 11.5 - 15.5 %   Platelets 168 150 - 400 K/uL  Differential     Status: Abnormal   Collection Time: 06/08/14 12:30 PM  Result Value Ref Range   Neutrophils Relative % 80 (H) 43 - 77 %   Neutro Abs 5.5 1.7 - 7.7 K/uL   Lymphocytes Relative 14 12 - 46 %   Lymphs Abs 0.9 0.7 - 4.0 K/uL   Monocytes Relative 6 3 - 12 %   Monocytes Absolute 0.4 0.1 - 1.0 K/uL   Eosinophils Relative 0 0 - 5 %   Eosinophils Absolute 0.0 0.0 - 0.7 K/uL   Basophils Relative 0 0 - 1 %   Basophils Absolute 0.0 0.0 - 0.1 K/uL  Comprehensive metabolic panel     Status: Abnormal   Collection Time: 06/08/14 12:30 PM  Result Value Ref Range   Sodium 146 137 - 147  mEq/L   Potassium 5.1 3.7 - 5.3 mEq/L   Chloride 111 96 - 112 mEq/L   CO2 21 19 - 32 mEq/L   Glucose, Bld 89 70 - 99 mg/dL   BUN 50 (H) 6 - 23 mg/dL   Creatinine, Ser 2.72 (H) 0.50 - 1.35 mg/dL   Calcium 9.1 8.4 - 10.5 mg/dL  Total Protein 6.6 6.0 - 8.3 g/dL   Albumin 3.1 (L) 3.5 - 5.2 g/dL   AST 20 0 - 37 U/L   ALT 22 0 - 53 U/L   Alkaline Phosphatase 83 39 - 117 U/L   Total Bilirubin <0.2 (L) 0.3 - 1.2 mg/dL   GFR calc non Af Amer 18 (L) >90 mL/min   GFR calc Af Amer 21 (L) >90 mL/min    Comment: (NOTE) The eGFR has been calculated using the CKD EPI equation. This calculation has not been validated in all clinical situations. eGFR's persistently <90 mL/min signify possible Chronic Kidney Disease.    Anion gap 14 5 - 15  Troponin I     Status: None   Collection Time: 06/08/14 12:30 PM  Result Value Ref Range   Troponin I <0.30 <0.30 ng/mL    Comment:        Due to the release kinetics of cTnI, a negative result within the first hours of the onset of symptoms does not rule out myocardial infarction with certainty. If myocardial infarction is still suspected, repeat the test at appropriate intervals.    Ct Head Wo Contrast  06/08/2014   CLINICAL DATA:  Awoke this morning with garbled speech, hard understand, somnolence, not alert as normal, possible LEFT side facial droop, history Alzheimer's dementia, short-term memory loss, hypertension, diabetes, CHF  EXAM: CT HEAD WITHOUT CONTRAST  TECHNIQUE: Contiguous axial images were obtained from the base of the skull through the vertex without intravenous contrast.  COMPARISON:  01/01/2014  FINDINGS: Generalized atrophy.  Ex vacuo dilatation of the ventricular system.  No midline shift or mass effect.  Small vessel chronic ischemic changes of deep cerebral white matter.  No intracranial hemorrhage, mass lesion, or evidence of the acute infarction.  No extra-axial fluid collections.  Chronic sinusitis involving the RIGHT sphenoid sinus  with osseous thickening and sclerosis.  Atherosclerotic calcifications of the carotid siphons bilaterally.  No new bone or sinus abnormality.  IMPRESSION: Generalized atrophy with small vessel chronic ischemic changes of deep cerebral white matter.  No acute intracranial abnormalities.   Electronically Signed   By: Lavonia Dana M.D.   On: 06/08/2014 13:10     Assessment: 80 y.o. male admitted after sustaining a transient episode of dysarthria, right face weakness, drooling. Non focal neuro-exam. CT brain unremarkable for acute abnormality. Left brain TIA versus seizure. Complete TIA work up. EEG. Stroke team will follow up in the morning.   Stroke Risk Factors - age, HTN, chronic congestive heart failure, DM  Plan: 1. HgbA1c, fasting lipid panel 2. MRI, MRA  of the brain without contrast 3. Echocardiogram 4. Carotid dopplers 5. Prophylactic therapy-aspirin after passing swallowing evaluation 6. Risk factor modification 7. Telemetry monitoring 8. Frequent neuro checks 9. PT/OT SLP  Dorian Pod, MD [facility] [phone]  06/08/2014, 6:36 PM

## 2014-06-08 NOTE — ED Notes (Signed)
Per [facility], pt from home lives with daughter. Hx of dementia, alzheimers, hx of short term memory loss which is his normal. Pt went to bed normal last night. 1900 yesterday (06/07/14) LSN. This morning woke up with speech garbled, hard to understand, very sleepy. Not as alert as normal. No weakness, equal grip strengths. Pt ambulated at home without issue. Pt smiled for ems and possible left sided facial droop.

## 2014-06-08 NOTE — Progress Notes (Signed)
EEG Completed; Results Pending  

## 2014-06-08 NOTE — ED Provider Notes (Signed)
CSN: [id number]     Arrival date & time 06/08/14  1143 History   First MD Initiated Contact with Patient 06/08/14 1146     Chief Complaint  Patient presents with  . Stroke Symptoms    L5 caveat: Dementia  HPI Patient is brought to emergency department because when he awoke this morning he was noted to have right-sided facial droop with some drooling of the right side of his mouth with some slurred speech.  No obvious weakness of his arms or legs was noted by family.  This has since resolved.  No prior history of stroke.  They do report that he was somewhat sleepy and confused this morning as well.  Patient has a history of diabetes, aortic stenosis, peripheral vascular disease   Past Medical History  Diagnosis Date  . Dental caries   . Hypertension   . Aortic stenosis     Echocardiogram 12/20/11: Mild LVH, EF 16-96%, grade 1 diastolic dysfunction, mild aortic stenosis, mean gradient 13, AVA 1.39 cm, mild MAC and nodular sclerosis of the tip of the anterior leaflet, mild MR, mild to moderate LAE, mild TR.  [name] Peripheral vascular disease   . Venous insufficiency   . Diabetes mellitus   . Diverticulosis of colon   . History of colonic polyps   . Renal insufficiency   . Adenocarcinoma of prostate   . Chronic low back pain   . History of syncope   . Peripheral neuropathy   . Sebaceous cyst   . Anemia   . Hyperkalemia 12/19/2011  . Syncope   . CHF (congestive heart failure)    Past Surgical History  Procedure Laterality Date  . Appendectomy     Family History  Problem Relation Age of Onset  . Hypertension    . Hypertension Mother   . Heart disease Mother    History  Substance Use Topics  . Smoking status: Former Smoker    Types: Cigars    Quit date: 07/13/1978  . Smokeless tobacco: Not on file     Comment: for 2-3 years  . Alcohol Use: No    Review of Systems  Unable to perform ROS     Allergies  Nifedipine  Home Medications   Prior to Admission medications    Medication Sig Start Date End Date Taking? Authorizing Provider  aspirin 81 MG tablet Take 81 mg by mouth daily.   Yes Historical Provider, MD  Blood Glucose Monitoring Suppl (ONE TOUCH ULTRA 2) W/DEVICE KIT Test up to TID dx: E11.0 04/20/14  Yes Janith Lima, MD  fentaNYL (DURAGESIC - DOSED MCG/HR) 12 MCG/HR Place 1 patch (12.5 mcg total) onto the skin every 3 (three) days. 05/15/14  Yes Janith Lima, MD  ferrous sulfate 325 (65 FE) MG tablet Take 1 tablet (325 mg total) by mouth daily. 04/30/14  Yes Janith Lima, MD  finasteride (PROSCAR) 5 MG tablet Take 1 tablet (5 mg total) by mouth daily. 05/11/14  Yes Janith Lima, MD  furosemide (LASIX) 20 MG tablet Take 1 tablet (20 mg total) by mouth daily. 04/30/14  Yes Janith Lima, MD  glucose blood (ONE TOUCH ULTRA TEST) test strip Test up to TID dx: E11.0 04/20/14  Yes Janith Lima, MD  hydrALAZINE (APRESOLINE) 50 MG tablet Take 1 tablet (50 mg total) by mouth 3 (three) times daily. 05/08/14  Yes Janith Lima, MD  insulin aspart (NOVOLOG) 100 UNIT/ML injection 5 units with meals and 5 additional units if  blood sugars above 150 with meals to equal 10 units 01/04/14  Yes Thurnell Lose, MD  insulin glargine (LANTUS) 100 UNIT/ML injection Inject 0.06 mLs (6 Units total) into the skin daily. 01/04/14  Yes Thurnell Lose, MD  isosorbide mononitrate (IMDUR) 30 MG 24 hr tablet TAKE ONE TABLET BY MOUTH ONE TIME DAILY Patient taking differently: Take 30 mg by mouth daily.  04/30/14  Yes Janith Lima, MD  Linaclotide [facility]) 145 MCG CAPS capsule Take 1 capsule (145 mcg total) by mouth daily. 04/19/14  Yes Janith Lima, MD  meclizine (ANTIVERT) 25 MG tablet Take 25 mg by mouth 3 (three) times daily as needed for dizziness.   Yes Historical Provider, MD  Multiple Vitamins-Minerals (MENS MULTIVITAMIN PLUS PO) Take 1 tablet by mouth daily.   Yes Historical Provider, MD  NIFEdipine (PROCARDIA-XL/ADALAT-CC/NIFEDICAL-XL) 30 MG 24 hr tablet Take 1  tablet (30 mg total) by mouth 2 (two) times daily. 04/30/14  Yes Janith Lima, MD  ONE TOUCH LANCETS MISC Test up to TID dx: E11.0 04/20/14  Yes Janith Lima, MD  predniSONE (DELTASONE) 10 MG tablet Take 1 tablet (10 mg total) by mouth daily with breakfast. 05/08/14  Yes Janith Lima, MD  tamsulosin (FLOMAX) 0.4 MG CAPS capsule Take 1 capsule (0.4 mg total) by mouth 2 (two) times daily. 04/30/14  Yes Janith Lima, MD  vitamin B-12 (CYANOCOBALAMIN) 500 MCG tablet Take 500 mcg by mouth daily.     Yes Historical Provider, MD  vitamin C (ASCORBIC ACID) 500 MG tablet Take 500 mg by mouth daily.   Yes Historical Provider, MD   BP 130/40 mmHg  Pulse 53  Temp(Src) 97.6 F (36.4 C) (Oral)  Resp 16  SpO2 98% Physical Exam  Constitutional: He appears well-developed and well-nourished.  HENT:  Head: Normocephalic and atraumatic.  Eyes: EOM are normal.  Neck: Normal range of motion.  Cardiovascular: Normal rate, regular rhythm, normal heart sounds and intact distal pulses.   Pulmonary/Chest: Effort normal and breath sounds normal. No respiratory distress.  Abdominal: Soft. He exhibits no distension. There is no tenderness.  Musculoskeletal: Normal range of motion.  5 out of 5 strength in bilateral upper lower extremity major muscle groups  Neurological: He is alert.  Skin: Skin is warm and dry.  Psychiatric: He has a normal mood and affect. Judgment normal.  Nursing note and vitals reviewed.   ED Course  Procedures (including critical care time) Labs Review Labs Reviewed  CBC - Abnormal; Notable for the following:    RBC 3.63 (*)    Hemoglobin 10.2 (*)    HCT 32.1 (*)    RDW 15.9 (*)    All other components within normal limits  DIFFERENTIAL - Abnormal; Notable for the following:    Neutrophils Relative % 80 (*)    All other components within normal limits  COMPREHENSIVE METABOLIC PANEL - Abnormal; Notable for the following:    BUN 50 (*)    Creatinine, Ser 2.72 (*)    Albumin  3.1 (*)    Total Bilirubin <0.2 (*)    GFR calc non Af Amer 18 (*)    GFR calc Af Amer 21 (*)    All other components within normal limits  ETHANOL  PROTIME-INR  APTT  TROPONIN I   BUN  Date Value Ref Range Status  06/08/2014 50* 6 - 23 mg/dL Final  04/19/2014 48* 6 - 23 mg/dL Final  01/03/2014 35* 6 - 23 mg/dL Final  01/02/2014 39* 6 - 23 mg/dL Final   CREATININE, SER  Date Value Ref Range Status  06/08/2014 2.72* 0.50 - 1.35 mg/dL Final  04/19/2014 2.4* 0.4 - 1.5 mg/dL Final  01/03/2014 2.14* 0.50 - 1.35 mg/dL Final  01/02/2014 2.41* 0.50 - 1.35 mg/dL Final        Imaging Review Ct Head Wo Contrast  06/08/2014   CLINICAL DATA:  Awoke this morning with garbled speech, hard understand, somnolence, not alert as normal, possible LEFT side facial droop, history Alzheimer's dementia, short-term memory loss, hypertension, diabetes, CHF  EXAM: CT HEAD WITHOUT CONTRAST  TECHNIQUE: Contiguous axial images were obtained from the base of the skull through the vertex without intravenous contrast.  COMPARISON:  01/01/2014  FINDINGS: Generalized atrophy.  Ex vacuo dilatation of the ventricular system.  No midline shift or mass effect.  Small vessel chronic ischemic changes of deep cerebral white matter.  No intracranial hemorrhage, mass lesion, or evidence of the acute infarction.  No extra-axial fluid collections.  Chronic sinusitis involving the RIGHT sphenoid sinus with osseous thickening and sclerosis.  Atherosclerotic calcifications of the carotid siphons bilaterally.  No new bone or sinus abnormality.  IMPRESSION: Generalized atrophy with small vessel chronic ischemic changes of deep cerebral white matter.  No acute intracranial abnormalities.   Electronically Signed   By: Lavonia Dana M.D.   On: 06/08/2014 13:10  I personally reviewed the imaging tests through PACS system I reviewed available ER/hospitalization records through the EMR    EKG Interpretation   Date/Time:  Friday  June 08 2014 12:05:51 EST Ventricular Rate:  57 PR Interval:  218 QRS Duration: 89 QT Interval:  456 QTC Calculation: 444 R Axis:   -24 Text Interpretation:  Sinus rhythm Borderline prolonged PR interval  Inferior infarct, old No significant change was found Confirmed by Merikay Lesniewski   MD, Lennette Bihari ([id number]) on 06/08/2014 1:34:00 PM      MDM   Final diagnoses:  None     suspect TIA.  Slurred speech with right-sided facial droop.  Returned to baseline now.  Admit for stroke workup    Hoy Morn, MD 06/08/14 [phone]

## 2014-06-08 NOTE — Plan of Care (Signed)
Problem: Consults Goal: Ischemic Stroke Patient Education See Patient Education Module for education specifics. Outcome: Progressing Goal: Skin Care Protocol Initiated - if Braden Score 18 or less If consults are not indicated, leave blank or document N/A Outcome: Progressing Goal: Nutrition Consult-if indicated Outcome: Progressing Goal: Diabetes Guidelines if Diabetic/Glucose > 140 If diabetic or lab glucose is > 140 mg/dl - Initiate Diabetes/Hyperglycemia Guidelines & Document Interventions  Outcome: Progressing  Problem: Acute Treatment Outcomes Goal: Neuro exam at baseline or improved Outcome: Progressing Goal: BP within ordered parameters Outcome: Progressing Goal: Airway maintained/protected Outcome: Completed/Met Date Met:  06/08/14 Goal: 02 Sats > 94% Outcome: Completed/Met Date Met:  06/08/14 Goal: Hemodynamically stable Outcome: Completed/Met Date Met:  06/08/14 Goal: tPA Patient w/o S&S of bleeding Outcome: Not Applicable Date Met:  16/83/87 Goal: Prognosis discussed with family/patient as appropriate Outcome: Progressing Goal: Other Acute Treatment Outcomes Outcome: Progressing

## 2014-06-09 DIAGNOSIS — S129XXS Fracture of neck, unspecified, sequela: Secondary | ICD-10-CM

## 2014-06-09 DIAGNOSIS — I359 Nonrheumatic aortic valve disorder, unspecified: Secondary | ICD-10-CM

## 2014-06-09 DIAGNOSIS — I10 Essential (primary) hypertension: Secondary | ICD-10-CM

## 2014-06-09 DIAGNOSIS — E118 Type 2 diabetes mellitus with unspecified complications: Secondary | ICD-10-CM | POA: Diagnosis not present

## 2014-06-09 DIAGNOSIS — G458 Other transient cerebral ischemic attacks and related syndromes: Secondary | ICD-10-CM

## 2014-06-09 LAB — GLUCOSE, CAPILLARY
GLUCOSE-CAPILLARY: 213 mg/dL — AB (ref 70–99)
Glucose-Capillary: 102 mg/dL — ABNORMAL HIGH (ref 70–99)
Glucose-Capillary: 120 mg/dL — ABNORMAL HIGH (ref 70–99)
Glucose-Capillary: 47 mg/dL — ABNORMAL LOW (ref 70–99)

## 2014-06-09 LAB — HEMOGLOBIN A1C
Hgb A1c MFr Bld: 6.9 % — ABNORMAL HIGH (ref <5.7)
MEAN PLASMA GLUCOSE: 151 mg/dL — AB (ref <117)

## 2014-06-09 LAB — LIPID PANEL
Cholesterol: 157 mg/dL (ref 0–200)
HDL: 67 mg/dL (ref >39)
LDL CALC: 81 mg/dL (ref 0–99)
Total CHOL/HDL Ratio: 2.3 RATIO
Triglycerides: 45 mg/dL (ref <150)
VLDL: 9 mg/dL (ref 0–40)

## 2014-06-09 MED ORDER — ATORVASTATIN CALCIUM 10 MG PO TABS: 10.0000 mg | ORAL_TABLET | Freq: Every day | ORAL | Status: AC

## 2014-06-09 MED ORDER — DEXTROSE-NACL 5-0.9 % IV SOLN
INTRAVENOUS | Status: DC
  Administered 2014-06-09: 07:00:00 via INTRAVENOUS

## 2014-06-09 MED ORDER — ASPIRIN EC 325 MG PO TBEC: 325.0000 mg | DELAYED_RELEASE_TABLET | Freq: Every day | ORAL | Status: AC

## 2014-06-09 MED ORDER — DEXTROSE 50 % IV SOLN
INTRAVENOUS | Status: AC
  Administered 2014-06-09: 50 mL
  Filled 2014-06-09: qty 50

## 2014-06-09 MED ORDER — RESOURCE THICKENUP CLEAR PO POWD
ORAL | Status: DC | PRN
  Filled 2014-06-09: qty 125

## 2014-06-09 MED ORDER — DEXTROSE 50 % IV SOLN
25.0000 mL | Freq: Once | INTRAVENOUS | Status: AC
  Administered 2014-06-09: 25 mL via INTRAVENOUS

## 2014-06-09 MED ORDER — HYDRALAZINE HCL 20 MG/ML IJ SOLN
10.0000 mg | Freq: Four times a day (QID) | INTRAMUSCULAR | Status: DC | PRN
  Administered 2014-06-09: 10 mg via INTRAVENOUS
  Filled 2014-06-09: qty 1

## 2014-06-09 NOTE — Progress Notes (Signed)
UR completed 

## 2014-06-09 NOTE — Progress Notes (Signed)
CBG 47. Asymptomatic. 12.5 gm D50 given. Will follow up in 15 min. Physician notified.

## 2014-06-09 NOTE — Progress Notes (Signed)
CBG recheck 102. Will continue to monitor closely overnight.

## 2014-06-09 NOTE — Progress Notes (Signed)
Orthopedic Tech Progress Note Patient Details:  Shane [name] 03-11-1917 [id number]  Ortho Devices Type of Ortho Device: Soft collar Ortho Device/Splint Location: neck Ortho Device/Splint Interventions: Application   Shane [name] 06/09/2014, 9:58 AM

## 2014-06-09 NOTE — Evaluation (Signed)
Speech Language Pathology Evaluation Patient Details Name: Kalden Wanke MRN: [id number] DOB: [birth year] Today's Date: 06/09/2014 Time: 3212-2482 SLP Time Calculation (min) (ACUTE ONLY): 35 min  Problem List:  Patient Active Problem List   Diagnosis Date Noted  . TIA (transient ischemic attack) 06/08/2014  . Atherosclerosis of native arteries of extremity with intermittent claudication 05/30/2014  . PAD (peripheral artery disease) 05/14/2014  . Edema 05/14/2014  . CN (constipation) 04/19/2014  . Cervical spine fracture 01/02/2014  . Hyperlipidemia with target LDL less than 100 12/07/2013  . Dementia 09/27/2013  . Chronic diastolic heart failure 50/09/7046  . DJD (degenerative joint disease) 01/18/2013  . CKD (chronic kidney disease) stage 4, GFR 15-29 ml/min 12/27/2011  . ADENOCARCINOMA, PROSTATE 11/09/2007  . Diabetes mellitus type 2, controlled, with complications 88/91/6945  . Hereditary and idiopathic peripheral neuropathy 11/09/2007  . Aortic valve disorder 11/09/2007  . Pernicious anemia 05/17/2007  . Essential hypertension 05/17/2007  . LOW BACK PAIN, CHRONIC 05/17/2007   Past Medical History:  Past Medical History  Diagnosis Date  . Dental caries   . Hypertension   . Aortic stenosis     Echocardiogram 12/20/11: Mild LVH, EF 03-88%, grade 1 diastolic dysfunction, mild aortic stenosis, mean gradient 13, AVA 1.39 cm, mild MAC and nodular sclerosis of the tip of the anterior leaflet, mild MR, mild to moderate LAE, mild TR.  [name] Peripheral vascular disease   . Venous insufficiency   . Diabetes mellitus   . Diverticulosis of colon   . History of colonic polyps   . Renal insufficiency   . Adenocarcinoma of prostate   . Chronic low back pain   . History of syncope   . Peripheral neuropathy   . Sebaceous cyst   . Anemia   . Hyperkalemia 12/19/2011  . Syncope   . CHF (congestive heart failure)    Past Surgical History:  Past Surgical History  Procedure Laterality Date   . Appendectomy     HPI:  80 y.o. male admitted after sustaining a transient episode of dysarthria, right face weakness, drooling. Non focal neuro-exam. CT brain unremarkable for acute abnormality.. Pt has a history of aspriation in 2013, MBS showed delayed swallow, sensed aspriation without expectoration of aspirate. Pt has been home from SNF for two months per DIL, but she has been giving him a regular diet and thin liqudis and is not aware of any trouble swallowing.    Assessment / Plan / Recommendation Clinical Impression  Pt demosntrates mild dysarthria and cognitive deficits (short term memory and orientation, awareness) consistent with baseline function. His expressive and receptive language are [facility]. Family at bedside reports he is close to baseline. Will not recommend any f/u for cognitive linguistic function.     SLP Assessment  Patient does not need any further Speech Lanaguage Pathology Services    Follow Up Recommendations  None    Frequency and Duration min 2x/week      Pertinent Vitals/Pain Pain Assessment: No/denies pain   SLP Goals  Progression toward goals: Progressing toward goals  SLP Evaluation Prior Functioning  Cognitive/Linguistic Baseline: Baseline deficits Baseline deficit details: orientation, memory, slurred speech  Lives With: Family Available Help at Discharge: Family   Cognition  Overall Cognitive Status: History of cognitive impairments - at baseline Arousal/Alertness: Awake/alert Orientation Level: Oriented to person;Oriented to place;Disoriented to time Attention: Focused;Sustained Focused Attention: Appears intact Sustained Attention: Appears intact Memory: Impaired Memory Impairment: Decreased short term memory Decreased Short Term Memory: Verbal basic Awareness: Impaired Awareness Impairment: Intellectual  impairment Problem Solving: Appears intact (verbal and functional basic)    Comprehension  Auditory Comprehension Overall Auditory  Comprehension: Appears within functional limits for tasks assessed    Expression Verbal Expression Overall Verbal Expression: Appears within functional limits for tasks assessed   Oral / Motor Oral Motor/Sensory Function Overall Oral Motor/Sensory Function: Appears within functional limits for tasks assessed Motor Speech Overall Motor Speech: Impaired Respiration: Within functional limits Phonation: Wet Resonance: Within functional limits Articulation: Impaired Level of Impairment: Word Intelligibility: Intelligibility reduced Word: 50-74% accurate Phrase: 50-74% accurate Sentence: 50-74% accurate Conversation: 50-74% accurate Motor Planning: Witnin functional limits Motor Speech Errors: Consistent Interfering Components: Premorbid status   GO Functional Assessment Tool Used: clinical judgement Functional Limitations: Swallowing Swallow Current Status (I2641): At least 20 percent but less than 40 percent impaired, limited or restricted Swallow Goal Status [id number]): At least 20 percent but less than 40 percent impaired, limited or restricted   Joseangel Nettleton, Katherene Ponto 06/09/2014, 9:33 AM

## 2014-06-09 NOTE — Progress Notes (Signed)
Physical Therapy Evaluation Patient Details Name: Shane [name] MRN: [id number] DOB: 1917/05/21 Today's Date: 06/09/2014   History of Present Illness  Patient is a 80 yo male admitted 06/08/14 with AMS, slurred speech, facial droop, and drooling.  PMH:  HTN, AS, PVD, DM, neuropathy, back pain, CHF, syncope, CKD.  Clinical Impression  Patient presents with problems listed below.  Will benefit from acute PT to maximize functional mobility prior to return home with family.    Follow Up Recommendations Home health PT;Supervision/Assistance - 24 hour    Equipment Recommendations  None recommended by PT    Recommendations for Other Services       Precautions / Restrictions Precautions Precautions: Fall Restrictions Weight Bearing Restrictions: No      Mobility  Bed Mobility Overal bed mobility: Needs Assistance Bed Mobility: Supine to Sit;Sit to Supine     Supine to sit: Max assist Sit to supine: Total assist   General bed mobility comments: Verbal and tactile cues for technique.  Assist to move LE's off of bed and to raise trunk to sitting position.  Patient leaning posteriorly in sitting - cues to shift weight forward.  Able to sit with min guard assist.  Total assist to return to supine and scoot to HOB.  Transfers Overall transfer level: Needs assistance Equipment used: Rolling walker (2 wheeled) Transfers: Sit to/from Stand Sit to Stand: Mod assist;+2 physical assistance         General transfer comment: Verbal and tactile cues for hand placement.  Assist to rise to standing.  Assist to maintain balance in standing - patient leaning posteriorly.  Cues to shift weight forward onto RW.  Ambulation/Gait Ambulation/Gait assistance: Mod assist;+2 physical assistance Ambulation Distance (Feet): 3 Feet Assistive device: Rolling walker (2 wheeled) Gait Pattern/deviations: Step-through pattern;Decreased step length - right;Decreased step length - left;Decreased stride  length;Shuffle;Leaning posteriorly     General Gait Details: Verbal cues to take a few steps forward and to keep feet inside RW.  After 3', patient's knees buckled.  Attempted to help patient onto bed - patient unable to bear weight on LE's.  Patient slowly lowered to sitting on floor.  Able to speak to PT.  Patient required +3 assist to return to sitting at EOB.  Patient able to maintain balance in sitting.  Assisted patient (+2 total) to supine position.  RN and MD notified.  Stairs            Wheelchair Mobility    Modified Rankin (Stroke Patients Only) Modified Rankin (Stroke Patients Only) Pre-Morbid Rankin Score: Moderately severe disability Modified Rankin: Severe disability     Balance Overall balance assessment: Needs assistance Sitting-balance support: Bilateral upper extremity supported;Feet supported Sitting balance-Leahy Scale: Fair   Postural control: Posterior lean Standing balance support: Bilateral upper extremity supported Standing balance-Leahy Scale: Poor                               Pertinent Vitals/Pain Pain Assessment: No/denies pain    Home Living Family/patient expects to be discharged to:: Private residence Living Arrangements: Children (Son and daughter-in-law) Available Help at Discharge: Family;Available 24 hours/day Type of Home: House         Home Equipment: Walker - 2 wheels;Wheelchair - manual      Prior Function Level of Independence: Needs assistance   Gait / Transfers Assistance Needed: Uses RW.  Assist of 1 to ambulate.  ADL's / Homemaking Assistance Needed: Assist with ADL's  Hand Dominance   Dominant Hand: Right    Extremity/Trunk Assessment   Upper Extremity Assessment: Generalized weakness           Lower Extremity Assessment: RLE deficits/detail;LLE deficits/detail RLE Deficits / Details: Strength 4-/5 LLE Deficits / Details: Strength 4-/5     Communication   Communication:  HOH;Expressive difficulties (Speech slurred)  Cognition Arousal/Alertness: Lethargic Behavior During Therapy: Flat affect Overall Cognitive Status: No family/caregiver present to determine baseline cognitive functioning (Disoriented to time and situation; poor historian)                      General Comments      Exercises        Assessment/Plan    PT Assessment Patient needs continued PT services  PT Diagnosis Difficulty walking;Generalized weakness;Altered mental status   PT Problem List Decreased strength;Decreased activity tolerance;Decreased balance;Decreased mobility;Decreased cognition;Decreased knowledge of use of DME;Impaired sensation  PT Treatment Interventions DME instruction;Gait training;Functional mobility training;Therapeutic activities;Balance training;Cognitive remediation;Patient/family education   PT Goals (Current goals can be found in the Care Plan section) Acute Rehab PT Goals Patient Stated Goal: None stated PT Goal Formulation: Patient unable to participate in goal setting Time For Goal Achievement: 06/16/14 Potential to Achieve Goals: Fair    Frequency Min 3X/week   Barriers to discharge        Co-evaluation               End of Session Equipment Utilized During Treatment: Gait belt Activity Tolerance: Patient limited by lethargy;Patient limited by fatigue Patient left: in bed;with call bell/phone within reach;with bed alarm set Nurse Communication: Mobility status (Patient lowered to floor)    Functional Assessment Tool Used: Clinical judgement Functional Limitation: Mobility: Walking and moving around Mobility: Walking and Moving Around Current Status (T4656): At least 60 percent but less than 80 percent impaired, limited or restricted Mobility: Walking and Moving Around Goal Status [phone]): At least 20 percent but less than 40 percent impaired, limited or restricted    Time: 1116-1141 PT Time Calculation (min) (ACUTE ONLY):  25 min   Charges:   PT Evaluation $Initial PT Evaluation Tier I: 1 Procedure PT Treatments $Therapeutic Activity: 8-22 mins   PT G Codes:   Functional Assessment Tool Used: Clinical judgement Functional Limitation: Mobility: Walking and moving around    Despina Pole 06/09/2014, 12:06 PM Carita Pian. Sanjuana Kava, [place] Pager [phone]

## 2014-06-09 NOTE — Progress Notes (Signed)
CBG 56. Pt is asymptomatic. 25 ml of D50 given as patient is NPO. Will follow up in 15 min.

## 2014-06-09 NOTE — Care Management Note (Signed)
    Page 1 of 1   06/09/2014     4:04:37 PM CARE MANAGEMENT NOTE 06/09/2014  Patient:  Shane [name]   Account Number:  [id number]  Date Initiated:  06/09/2014  Documentation initiated by:  [facility]  Subjective/Objective Assessment:   adm: right face droop, dysarthria, drooling right side of his mouth     Action/Plan:   discharge planning   Anticipated DC Date:  06/09/2014   Anticipated DC Plan:  [place]  CM consult      [facility] Choice  Resumption Of Svcs/PTA Provider   Choice offered to / List presented to:          [facility] arranged  [place] RN      [place]   Status of service:  Completed, signed off Medicare Important Message given?   (If response is "NO", the following Medicare IM given date fields will be blank) Date Medicare IM given:   Medicare IM given by:   Date Additional Medicare IM given:   Additional Medicare IM given by:    Discharge Disposition:  [place]  Per UR Regulation:    If discussed at Long Length of Stay Meetings, dates discussed:    Comments:  06/09/14 16:00 Cm spoke with son of pt who states his father (the pt) is active with [facility] for HHPT/OT.  CM states I will add on the speech therapy and nurse.  CM called [facility] rep, Stanton Kidney to notify her of pt discharge and the add on disciplines.  No other CM needs were communicated.  Shane [name], BSN, CM [phone].

## 2014-06-09 NOTE — Progress Notes (Signed)
Social worker called to notify that patient has been d/c.

## 2014-06-09 NOTE — Plan of Care (Signed)
Problem: SLP Dysphagia Goals Goal: Patient will utilize recommended strategies Patient will utilize recommended strategies during swallow to increase swallowing safety with  Outcome: Progressing     

## 2014-06-09 NOTE — Evaluation (Signed)
Clinical/Bedside Swallow Evaluation Patient Details  Name: Shane [name] MRN: [id number] Date of Birth: 1917-02-13  Today's Date: 06/09/2014 Time: 6213-0865 SLP Time Calculation (min) (ACUTE ONLY): 35 min  Past Medical History:  Past Medical History  Diagnosis Date  . Dental caries   . Hypertension   . Aortic stenosis     Echocardiogram 12/20/11: Mild LVH, EF 78-46%, grade 1 diastolic dysfunction, mild aortic stenosis, mean gradient 13, AVA 1.39 cm, mild MAC and nodular sclerosis of the tip of the anterior leaflet, mild MR, mild to moderate LAE, mild TR.  [name] Peripheral vascular disease   . Venous insufficiency   . Diabetes mellitus   . Diverticulosis of colon   . History of colonic polyps   . Renal insufficiency   . Adenocarcinoma of prostate   . Chronic low back pain   . History of syncope   . Peripheral neuropathy   . Sebaceous cyst   . Anemia   . Hyperkalemia 12/19/2011  . Syncope   . CHF (congestive heart failure)    Past Surgical History:  Past Surgical History  Procedure Laterality Date  . Appendectomy     HPI:  80 y.o. male admitted after sustaining a transient episode of dysarthria, right face weakness, drooling. Non focal neuro-exam. CT brain unremarkable for acute abnormality.. Pt has a history of aspriation in 2013, MBS showed delayed swallow, sensed aspriation without expectoration of aspirate. Pt has been home from SNF for two months per DIL, but she has been giving him a regular diet and thin liqudis and is not aware of any trouble swallowing.    Assessment / Plan / Recommendation Clinical Impression  Pt demonstrates impaired swallow function, consistent with findings from 2013 MBS. Pt has delayed, audible swallow with wet vocal quality, occasional cough. Pts MRI is negative and does not appear acutely impaired, though suspect he is at risk for aspiration even at baseline due to age related dysphagia. Recommend trial of nectar thick liquids and soft solids to  reduce aspiration as pt regains baseline mobility and function. Do not anticipate need for modified diet into the long term given that he was tolerating thin liquids at home without recurrent pna. Will follow for education with family and d/c plan.     Aspiration Risk  Moderate    Diet Recommendation Dysphagia 2 (Fine chop);Nectar-thick liquid   Liquid Administration via: Cup;No straw Medication Administration: Whole meds with puree Supervision: Patient able to self feed;Full supervision/cueing for compensatory strategies Compensations: Slow rate;Small sips/bites Postural Changes and/or Swallow Maneuvers: Seated upright 90 degrees    Other  Recommendations Oral Care Recommendations: Oral care BID Other Recommendations: Order thickener from pharmacy   Follow Up Recommendations  None    Frequency and Duration min 2x/week  1 week   Pertinent Vitals/Pain NA    SLP Swallow Goals     Swallow Study Prior Functional Status       General HPI: 80 y.o. male admitted after sustaining a transient episode of dysarthria, right face weakness, drooling. Non focal neuro-exam. CT brain unremarkable for acute abnormality.. Pt has a history of aspriation in 2013, MBS showed delayed swallow, sensed aspriation without expectoration of aspirate. Pt has been home from SNF for two months per DIL, but she has been giving him a regular diet and thin liqudis and is not aware of any trouble swallowing.  Type of Study: Bedside swallow evaluation Previous Swallow Assessment: MBS 2013 - sensed aspiration  Diet Prior to this Study: NPO Temperature Spikes Noted:  No Respiratory Status: Room air Behavior/Cognition: Alert;Cooperative;Pleasant mood Oral Cavity - Dentition: Poor condition;Missing dentition Self-Feeding Abilities: Able to feed self Patient Positioning: Upright in bed Baseline Vocal Quality: Wet Volitional Cough: Congested Volitional Swallow: Able to elicit    Oral/Motor/Sensory Function Overall  Oral Motor/Sensory Function: Appears within functional limits for tasks assessed   Ice Chips     Thin Liquid Thin Liquid: Impaired Presentation: Cup;Straw;Self Fed Pharyngeal  Phase Impairments: Suspected delayed Swallow;Multiple swallows;Wet Vocal Quality;Cough - Immediate    Nectar Thick     Honey Thick Honey Thick Liquid: Not tested   Puree Puree: Impaired Presentation: Self Fed;Spoon Pharyngeal Phase Impairments: Suspected delayed Swallow;Multiple swallows;Wet Vocal Quality   Solid   GO Functional Assessment Tool Used: clinical judgement Functional Limitations: Swallowing Swallow Current Status (E9381): At least 20 percent but less than 40 percent impaired, limited or restricted Swallow Goal Status [phone]): At least 20 percent but less than 40 percent impaired, limited or restricted  Solid: Impaired Presentation: Self Fed Pharyngeal Phase Impairments: Suspected delayed Swallow;Multiple swallows;Throat Clearing - Immediate      Herbie Baltimore, MA CCC-SLP [phone]  Lynann Beaver 06/09/2014,9:26 AM

## 2014-06-09 NOTE — Progress Notes (Signed)
Echocardiogram 2D Echocardiogram has been performed.  Doyle Askew 06/09/2014, 11:02 AM

## 2014-06-09 NOTE — Progress Notes (Signed)
Nutrition Brief Note  Patient identified on the Malnutrition Screening Tool (MST) Report  Wt Readings from Last 15 Encounters:  05/30/14 163 lb 6.4 oz (74.118 kg)  04/19/14 165 lb (74.844 kg)  02/16/14 146 lb (66.225 kg)  02/06/14 153 lb (69.4 kg)  01/04/14 152 lb 12.8 oz (69.31 kg)  10/09/13 155 lb 12.8 oz (70.67 kg)  09/27/13 153 lb 9.6 oz (69.673 kg)  08/11/13 157 lb (71.215 kg)  05/01/13 153 lb (69.4 kg)  04/11/13 153 lb 6.4 oz (69.582 kg)  01/31/13 162 lb (73.483 kg)  01/26/13 162 lb 12.8 oz (73.846 kg)  01/18/13 160 lb 12.8 oz (72.938 kg)  09/27/12 159 lb 3.2 oz (72.213 kg)  05/30/12 156 lb 6.4 oz (70.943 kg)   Patient reports that he usually weighs 155 lbs. He denies any recent weight loss and reports that his appetite has been very good and he was eating well PTA. He reports eating soft foods, 3 meals daily; his son's wife prepares all of his food.  Current diet order is Dysphagia 2 with nectar-thick liquids, diet was advanced this AM. Patient reports feeling hungry and feels he will eat well during admission. Labs and medications reviewed.   No nutrition interventions warranted at this time. If nutrition issues arise, please consult RD.   Pryor Ochoa RD, LDN Inpatient Clinical Dietitian Pager: [phone] After Hours Pager: [phone]

## 2014-06-09 NOTE — Discharge Summary (Signed)
PATIENT DETAILS Name: Shane [name] Age: 80 y.o. Sex: male Date of Birth: 1917/02/12 MRN: [id number]. Admitting Physician: Phillips Climes, MD GYI:RSWNIO Ronnald Ramp, MD  Admit Date: 06/08/2014 Discharge date: 06/09/2014  Recommendations for Outpatient Follow-up:  1. If possible, repeat lipid panel and A1c in 3-6 months. 2. Please check electrolytes at next visit.  PRIMARY DISCHARGE DIAGNOSIS:  Principal Problem:   TIA (transient ischemic attack) Active Problems:   ADENOCARCINOMA, PROSTATE   Diabetes mellitus type 2, controlled, with complications   Essential hypertension   Chronic diastolic heart failure   Cervical spine fracture      PAST MEDICAL HISTORY: Past Medical History  Diagnosis Date  . Dental caries   . Hypertension   . Aortic stenosis     Echocardiogram 12/20/11: Mild LVH, EF 27-03%, grade 1 diastolic dysfunction, mild aortic stenosis, mean gradient 13, AVA 1.39 cm, mild MAC and nodular sclerosis of the tip of the anterior leaflet, mild MR, mild to moderate LAE, mild TR.  [name] Peripheral vascular disease   . Venous insufficiency   . Diabetes mellitus   . Diverticulosis of colon   . History of colonic polyps   . Renal insufficiency   . Adenocarcinoma of prostate   . Chronic low back pain   . History of syncope   . Peripheral neuropathy   . Sebaceous cyst   . Anemia   . Hyperkalemia 12/19/2011  . Syncope   . CHF (congestive heart failure)     DISCHARGE MEDICATIONS: Current Discharge Medication List    START taking these medications   Details  aspirin EC 325 MG tablet Take 1 tablet (325 mg total) by mouth daily. Qty: 30 tablet, Refills: 0    atorvastatin (LIPITOR) 10 MG tablet Take 1 tablet (10 mg total) by mouth daily. Qty: 30 tablet, Refills: 0      CONTINUE these medications which have NOT CHANGED   Details  Blood Glucose Monitoring Suppl (ONE TOUCH ULTRA 2) W/DEVICE KIT Test up to TID dx: E11.0 Qty: 1 each, Refills: 2    fentaNYL (DURAGESIC -  DOSED MCG/HR) 12 MCG/HR Place 1 patch (12.5 mcg total) onto the skin every 3 (three) days. Qty: 10 patch, Refills: 0   Associated Diagnoses: Primary osteoarthritis involving multiple joints    ferrous sulfate 325 (65 FE) MG tablet Take 1 tablet (325 mg total) by mouth daily. Qty: 90 tablet, Refills: 2    finasteride (PROSCAR) 5 MG tablet Take 1 tablet (5 mg total) by mouth daily. Qty: 90 tablet, Refills: 1    furosemide (LASIX) 20 MG tablet Take 1 tablet (20 mg total) by mouth daily. Qty: 90 tablet, Refills: 2   Associated Diagnoses: Essential hypertension; CKD (chronic kidney disease) stage 3, GFR 30-59 ml/min    glucose blood (ONE TOUCH ULTRA TEST) test strip Test up to TID dx: E11.0 Qty: 100 each, Refills: 12    hydrALAZINE (APRESOLINE) 50 MG tablet Take 1 tablet (50 mg total) by mouth 3 (three) times daily. Qty: 90 tablet, Refills: 11   Associated Diagnoses: Essential hypertension; CKD (chronic kidney disease) stage 4, GFR 15-29 ml/min    insulin aspart (NOVOLOG) 100 UNIT/ML injection 5 units with meals and 5 additional units if blood sugars above 150 with meals to equal 10 units    isosorbide mononitrate (IMDUR) 30 MG 24 hr tablet TAKE ONE TABLET BY MOUTH ONE TIME DAILY Qty: 90 tablet, Refills: 2    Linaclotide (LINZESS) 145 MCG CAPS capsule Take 1 capsule (145 mcg total) by  mouth daily. Qty: 30 capsule, Refills: 11   Associated Diagnoses: Constipation, unspecified constipation type    meclizine (ANTIVERT) 25 MG tablet Take 25 mg by mouth 3 (three) times daily as needed for dizziness.    Multiple Vitamins-Minerals (MENS MULTIVITAMIN PLUS PO) Take 1 tablet by mouth daily.    NIFEdipine (PROCARDIA-XL/ADALAT-CC/NIFEDICAL-XL) 30 MG 24 hr tablet Take 1 tablet (30 mg total) by mouth 2 (two) times daily. Qty: 180 tablet, Refills: 2    ONE TOUCH LANCETS MISC Test up to TID dx: E11.0 Qty: 200 each, Refills: 3    predniSONE (DELTASONE) 10 MG tablet Take 1 tablet (10 mg total) by  mouth daily with breakfast. Qty: 90 tablet, Refills: 1    tamsulosin (FLOMAX) 0.4 MG CAPS capsule Take 1 capsule (0.4 mg total) by mouth 2 (two) times daily. Qty: 180 capsule, Refills: 2    vitamin B-12 (CYANOCOBALAMIN) 500 MCG tablet Take 500 mcg by mouth daily.      vitamin C (ASCORBIC ACID) 500 MG tablet Take 500 mg by mouth daily.      STOP taking these medications     aspirin 81 MG tablet      insulin glargine (LANTUS) 100 UNIT/ML injection         ALLERGIES:   Allergies  Allergen Reactions  . Nifedipine     edema    BRIEF HPI:  See H&P, Labs, Consult and Test reports for all details in brief, patient is a 80 year old male with past medical history of moderate aortic stenosis, diastolic CHF, stage IV chronic kidney disease, type 2 diabetes who was admitted for slurred speech, right facial droop.  CONSULTATIONS:   neurology  PERTINENT RADIOLOGIC STUDIES: Ct Head Wo Contrast  06/08/2014   CLINICAL DATA:  Awoke this morning with garbled speech, hard understand, somnolence, not alert as normal, possible LEFT side facial droop, history Alzheimer's dementia, short-term memory loss, hypertension, diabetes, CHF  EXAM: CT HEAD WITHOUT CONTRAST  TECHNIQUE: Contiguous axial images were obtained from the base of the skull through the vertex without intravenous contrast.  COMPARISON:  01/01/2014  FINDINGS: Generalized atrophy.  Ex vacuo dilatation of the ventricular system.  No midline shift or mass effect.  Small vessel chronic ischemic changes of deep cerebral white matter.  No intracranial hemorrhage, mass lesion, or evidence of the acute infarction.  No extra-axial fluid collections.  Chronic sinusitis involving the RIGHT sphenoid sinus with osseous thickening and sclerosis.  Atherosclerotic calcifications of the carotid siphons bilaterally.  No new bone or sinus abnormality.  IMPRESSION: Generalized atrophy with small vessel chronic ischemic changes of deep cerebral white matter.   No acute intracranial abnormalities.   Electronically Signed   By: Lavonia Dana M.D.   On: 06/08/2014 13:10   Ct Cervical Spine Wo Contrast  05/15/2014   CLINICAL DATA:  Follow-up of type 2 dens fracture.  EXAM: CT CERVICAL SPINE WITHOUT CONTRAST  TECHNIQUE: Multidetector CT imaging of the cervical spine was performed without intravenous contrast. Multiplanar CT image reconstructions were also generated.  COMPARISON:  CT scans dated 03/01/2014 and 01/01/2014  FINDINGS: There has been no change of the nonunion fracture of the base of the odontoid process of C2. There is approximately 2.8 mm of posterior subluxation of C 1 with respect to C2, stable. There are moderate arthritic changes between the occipital condyles and the lateral masses of C1, stable.  Flowing osteophytes fuse the spine from C2 through T1.  T1-2 is not fused. There is fusion at T2-3 and T3-4.  All of these findings are unchanged.  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   Electronically Signed   By: Rozetta Nunnery M.D.   On: 05/15/2014 15:19   Mr Brain Wo Contrast  06/08/2014   CLINICAL DATA:  TIA. Slurred speech, facial droop, drooling, altered mental status  EXAM: MRI HEAD WITHOUT CONTRAST  MRA HEAD WITHOUT CONTRAST  TECHNIQUE: Multiplanar, multiecho pulse sequences of the brain and surrounding structures were obtained without intravenous contrast. Angiographic images of the head were obtained using MRA technique without contrast.  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 Extensive soft tissue thickening around the dens with mild spinal stenosis. This is seen on prior studies.  Moderate atrophy.  Ventricular enlargement consistent with atrophy.  Negative for acute infarct. Chronic microvascular ischemic change in the white matter. Brainstem and cerebellum intact.  Negative for intracranial  hemorrhage.  Negative for mass or edema.  No shift of the midline structures.  Mucosal edema in the paranasal sinuses most notably right sphenoid sinus which is completely opacified.  MRA HEAD FINDINGS  Both vertebral arteries patent to the basilar. Focal loss of signal in the left vertebral artery at the level of the occipital bone most likely artifact. Small basilar artery due to fetal origin of the posterior cerebral artery. Basilar is patent and ends in superior cerebellar artery. Both posterior cerebral arteries are patent.  Atherosclerotic irregularity and mild stenosis in the cavernous carotid bilaterally. Anterior and middle cerebral artery is patent bilaterally.  Negative for cerebral aneurysm.  Image quality degraded by motion.  IMPRESSION: Chronic fracture the dens with nonunion  Atrophy and chronic microvascular ischemia. No acute intracranial abnormality.  No significant intracranial arterial stenosis.   Electronically Signed   By: Franchot Gallo M.D.   On: 06/08/2014 19:02   Mr Jodene Nam Head/brain Wo Cm  06/08/2014   CLINICAL DATA:  TIA. Slurred speech, facial droop, drooling, altered mental status  EXAM: MRI HEAD WITHOUT CONTRAST  MRA HEAD WITHOUT CONTRAST  TECHNIQUE: Multiplanar, multiecho pulse sequences of the brain and surrounding structures were obtained without intravenous contrast. Angiographic images of the head were obtained using MRA technique without contrast.  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 Extensive soft tissue thickening around the dens with mild spinal stenosis. This is seen on prior studies.  Moderate atrophy.  Ventricular enlargement consistent with atrophy.  Negative for acute infarct. Chronic microvascular ischemic change in the white matter. Brainstem and cerebellum intact.  Negative for intracranial hemorrhage.  Negative for mass or edema.  No shift of the midline structures.   Mucosal edema in the paranasal sinuses most notably right sphenoid sinus which is completely opacified.  MRA HEAD FINDINGS  Both vertebral arteries patent to the basilar. Focal loss of signal in the left vertebral artery at the level of the occipital bone most likely artifact. Small basilar artery due to fetal origin of the posterior cerebral artery. Basilar is patent and ends in superior cerebellar artery. Both posterior cerebral arteries are patent.  Atherosclerotic irregularity and mild stenosis in the cavernous carotid bilaterally. Anterior and middle cerebral artery is patent bilaterally.  Negative for cerebral aneurysm.  Image quality degraded by motion.  IMPRESSION: Chronic fracture the dens with nonunion  Atrophy and chronic microvascular ischemia. No acute intracranial abnormality.  No significant intracranial arterial stenosis.   Electronically Signed   By: Franchot Gallo M.D.   On: 06/08/2014 19:02     PERTINENT LAB RESULTS: CBC:  Recent Labs  06/08/14 1230  WBC 6.9  HGB 10.2*  HCT 32.1*  PLT 168   CMET CMP     Component Value Date/Time   NA 146 06/08/2014 1230   K 5.1 06/08/2014 1230   CL 111 06/08/2014 1230   CO2 21 06/08/2014 1230   GLUCOSE 89 06/08/2014 1230   GLUCOSE 115* 05/18/2006 0803   BUN 50* 06/08/2014 1230   CREATININE 2.72* 06/08/2014 1230   CALCIUM 9.1 06/08/2014 1230   PROT 6.6 06/08/2014 1230   ALBUMIN 3.1* 06/08/2014 1230   AST 20 06/08/2014 1230   ALT 22 06/08/2014 1230   ALKPHOS 83 06/08/2014 1230   BILITOT <0.2* 06/08/2014 1230   GFRNONAA 18* 06/08/2014 1230   GFRAA 21* 06/08/2014 1230    GFR Estimated Creatinine Clearance: 14.7 mL/min (by C-G formula based on Cr of 2.72). No results for input(s): LIPASE, AMYLASE in the last 72 hours.  Recent Labs  06/08/14 1230  TROPONINI <0.30   Invalid input(s): POCBNP No results for input(s): DDIMER in the last 72 hours.  Recent Labs  06/09/14 0423  HGBA1C 6.9*    Recent Labs  06/09/14 0423    CHOL 157  HDL 67  LDLCALC 81  TRIG 45  CHOLHDL 2.3   No results for input(s): TSH, T4TOTAL, T3FREE, THYROIDAB in the last 72 hours.  Invalid input(s): FREET3 No results for input(s): VITAMINB12, FOLATE, FERRITIN, TIBC, IRON, RETICCTPCT in the last 72 hours. Coags:  Recent Labs  06/08/14 1230  INR 1.05   Microbiology: No results found for this or any previous visit (from the past 240 hour(s)).   BRIEF HOSPITAL COURSE:   Principal Problem:   TIA (transient ischemic attack): Symptoms highly suggestive of TIA. All the symptoms have resolved. Underwent MRI of the brain which is negative for acute CVA. Carotid Doppler was negative for stenosis, echocardiogram was negative for embolic source. EEG negative for epileptiform foci. A1c at 6.9, would not advise strict diabetic control because of risk of hypoglycemia. LDL at 81, wIll start statins, however patient is 80 years old, ?benefit. On 81 mg of aspirin prior to admission, will be discharged on 325 mg. Seen by physical therapy, recommendations are for home health physical therapy services. Seen by speech therapy, recommendations are for dysphagia 2 diet, family agreeable, accepting all risks of aspiration.  Active Problems:  Transient hypoglycemia: Observed to be briefly hypoglycemic but asymptomatic. Managed with supportive care. Will stop basal insulin, and only continue NovoLog. Would not advise strict glycemic control in this frail 80 year old patient.   Chronic kidney disease stage QP:YPPJKDTOIZ close to usual baseline. Monitor electrolytes if possible in the outpatient setting   Chronic diastolic heart failure: euvolemic.   History of cervical spine fracture: Continue with cervical collar.   Hypertension: Continue antihypertensive medications.   Chronic prednisone therapy: Continue prednisone, not sure why on chronic prednisone. Defer to PCP.  TODAY-DAY OF DISCHARGE:  Subjective:   Loras Grieshop today has no headache,no  chest abdominal pain,no new weakness tingling or numbness  Objective:   Blood pressure 129/52, pulse 56, temperature 97.9 F (36.6 C), temperature source Oral, resp. rate 16, SpO2 98 %. No intake or output data in the 24 hours ending 06/09/14 1447 There were no vitals filed for this visit.  Exam Awake Alert, No new F.N deficits, Normal  affect [place].AT,PERRAL Supple Neck,No JVD, No cervical lymphadenopathy appriciated.  Symmetrical Chest wall movement, Good air movement bilaterally, CTAB RRR,No Gallops,Rubs or new Murmurs, No Parasternal Heave +ve B.Sounds, Abd Soft, Non tender, No organomegaly appriciated, No rebound -guarding or rigidity. No Cyanosis, Clubbing or edema, No new Rash or bruise  DISCHARGE CONDITION: Stable  DISPOSITION: Home with home health services  DISCHARGE INSTRUCTIONS:    Activity:  As tolerated with Full fall precautions use walker/cane & assistance as needed  Diet recommendation: Diabetic Diet Heart Healthy diet  Discharge Instructions    Call MD for:  persistant dizziness or light-headedness    Complete by:  As directed      Diet - low sodium heart healthy    Complete by:  As directed   Dysphagia 2 (Fine chop);Nectar-thick liquid     Diet Carb Modified    Complete by:  As directed   Dysphagia 2 (Fine chop);Nectar-thick liquid     Increase activity slowly    Complete by:  As directed            Follow-up Information    Follow up with [name], MD. Schedule an appointment as soon as possible for a visit in 1 week.   Specialty:  Internal Medicine   Contact information:   [address] [place] [address] [phone]       Follow up with SETHI,PRAMOD, MD. Schedule an appointment as soon as possible for a visit in 1 month.   Specialties:  Neurology, Radiology   Contact information:   [address] [place] [address] [phone]       Total Time spent on discharge equals 45  minutes.  SignedOren Binet 06/09/2014 2:47 PM

## 2014-06-09 NOTE — Progress Notes (Signed)
Patient been d/c home. D/c instruction given to caregiver, caregiver verbalized understanding.

## 2014-06-11 ENCOUNTER — Telehealth: Payer: Self-pay | Admitting: Internal Medicine

## 2014-06-11 ENCOUNTER — Encounter: Payer: Self-pay | Admitting: Internal Medicine

## 2014-06-11 NOTE — Telephone Encounter (Addendum)
Called pharmacy who stated that these prescriptions are filled by Dr. Candiss Norse. Pharmacy is sending requests for these rx's to Dr. Keturah Barre office.

## 2014-06-11 NOTE — Telephone Encounter (Signed)
Patient is requesting scripts for novolog and lantis to be sent to [facility] on [address]

## 2014-06-14 ENCOUNTER — Other Ambulatory Visit: Payer: Self-pay | Admitting: Internal Medicine

## 2014-06-14 ENCOUNTER — Telehealth: Payer: Self-pay | Admitting: Internal Medicine

## 2014-06-14 NOTE — Telephone Encounter (Signed)
Please advise refill? Per pharmacy patient is taking Lantis Solostar 6 units daily.

## 2014-06-14 NOTE — Telephone Encounter (Signed)
Is requesting novalog and lantus to be sent to [facility] on [address]

## 2014-06-19 ENCOUNTER — Encounter: Payer: Self-pay | Admitting: Internal Medicine

## 2014-06-19 ENCOUNTER — Ambulatory Visit (INDEPENDENT_AMBULATORY_CARE_PROVIDER_SITE_OTHER): Payer: Medicare Other | Admitting: Internal Medicine

## 2014-06-19 VITALS — BP 138/62 | HR 57 | Temp 97.9°F | Resp 14

## 2014-06-19 DIAGNOSIS — G458 Other transient cerebral ischemic attacks and related syndromes: Secondary | ICD-10-CM

## 2014-06-19 DIAGNOSIS — E118 Type 2 diabetes mellitus with unspecified complications: Secondary | ICD-10-CM

## 2014-06-19 DIAGNOSIS — N184 Chronic kidney disease, stage 4 (severe): Secondary | ICD-10-CM

## 2014-06-19 DIAGNOSIS — E785 Hyperlipidemia, unspecified: Secondary | ICD-10-CM

## 2014-06-19 NOTE — Progress Notes (Signed)
Pre visit review using our clinic review tool, if applicable. No additional management support is needed unless otherwise documented below in the visit note. 

## 2014-06-19 NOTE — Patient Instructions (Signed)
Goals for home glucose monitoring are : fasting  or morning glucose goal of  90-150. Any low blood glucoses, < 80 should be reported immediately.   BUN, creatinine, and GFR  all assess kidney function. To protect the kidneys it  is important to control your blood pressure and sugar. You should also stay well hydrated. Drink to thirst, up to 32 ounces of fluids per day.  Progressive kidney impairment is typically associated with anemia which is present

## 2014-06-19 NOTE — Progress Notes (Signed)
   Subjective:    Patient ID: Shane [name], male    DOB: [birth year], 80 y.o.   MRN: [id number]  HPI  The hospital records 11/27-11/28/15 were reviewed.  He was admitted with TIA symptoms manifested as slurred speech and facial asymmetry. These did resolve completely  Workup included carotid Doppler which revealed no stenosis of significance. MRI was also negative. Echocardiogram revealed no valvular lesions. EEG revealed no seizure disorder.  His A1c was 6.9%; at his age his goal will be less than 8. His LDL was 81; Lipitor 10 mg qd was started.  Creatinine was significantly elevated at 2.72.  As expected he did have  anemia; at the time of discharge hemoglobin was 10.2 and hematocrit 32.1.  He was changed from 81 mg aspirin to 325 mg aspirin @ the time of discharge. Follow-up with  Dr Leonie Man, Neurologist in one month is recommended.    Review of Systems No recurrence of neuro symptoms since D/c. Confusion is chronic as per his daughter..  Chest pain, palpitations, tachycardia, exertional dyspnea, paroxysmal nocturnal dyspnea, claudication or edema are absent.  His daughter is monitoring his sugars; fasting glucoses range 89-116. There've been no definite hypoglycemic episodes. Lunch time glucoses in 200s if he eats fruit.       Objective:   Physical Exam   Pertinent or positive findings include: He stayed in wheelchair during the exam. He has dense arcus senilis bilaterally. He appears much younger than stated age He has a few remaining teeth which are in poor repair There are intermittent fasciculations of the tongue. He is wearing a cervical collar at the instruction of his neurosurgeon He has a grade 1. 5-2 systolic murmur at the right base. He has minor rales at the bases without increased work of breathing He has 1+ pitting edema at the sock line Hemostasis hyperpigmentation changes over the shins present. Pedal pulses are decreased, especially the posterior tibial  pulses.  General appearance :adequately nourished; in no distress. Eyes: No conjunctival inflammation or scleral icterus is present. Oral exam: Lips and gums are healthy appearing.There is no oropharyngeal erythema or exudate noted.  Heart:  Normal rate and regular rhythm. S1 and S2 normal without gallop, click, rub or other extra sounds  Abdomen: bowel sounds normal, soft and non-tender without masses, organomegaly or hernias noted.  No guarding or rebound.  Vascular : all pulses equal ; no bruits present. Skin:Warm & dry.  Intact without suspicious lesions or rashes ; no  tenting Lymphatic: No lymphadenopathy is noted about the head, neck, axilla           Assessment & Plan:  See Current Assessment & Plan in Problem List under specific Diagnosis

## 2014-06-20 NOTE — Assessment & Plan Note (Signed)
As per Dr Leonie Man Continue Lipitor ;F/U labs in 6-8 weeks

## 2014-06-20 NOTE — Assessment & Plan Note (Signed)
Labs reviewed with daughter ; copy given to her See AVS

## 2014-06-20 NOTE — Assessment & Plan Note (Signed)
See AVS

## 2014-07-10 ENCOUNTER — Telehealth: Payer: Self-pay | Admitting: *Deleted

## 2014-07-10 NOTE — Telephone Encounter (Signed)
Faxed paperwork to [employer].

## 2014-07-19 ENCOUNTER — Telehealth: Payer: Self-pay | Admitting: Internal Medicine

## 2014-07-19 NOTE — Telephone Encounter (Signed)
[place] called in to report that pt sugar has droped under 90 7 times this month.  She is wanted to see what needs to be done?   [phone] Mechele Claude with [facility]

## 2014-07-19 NOTE — Telephone Encounter (Signed)
Called nurse she didnt hav so call daughter-in-law Marzella Schlein) to obtain BS reading no answer LMOM RTC...Shane [name]

## 2014-07-19 NOTE — Telephone Encounter (Signed)
What was the actual blood sugar Down to 60 is normal

## 2014-07-20 MED ORDER — POLYETHYLENE GLYCOL 3350 17 GM/SCOOP PO POWD: 1.0000 | Freq: Once | ORAL | Status: DC

## 2014-07-20 NOTE — Telephone Encounter (Signed)
Notified Frea with md response...Johny Chess

## 2014-07-20 NOTE — Telephone Encounter (Signed)
Al of those blood sugars are normal and nothing needs to be done about them He can try miralax

## 2014-07-20 NOTE — Telephone Encounter (Signed)
Daughter-in-law call back she states the BS usually run low in the morning  11/24- 86  " /25- 78  "/26- 89  12/3- 81  "/4- 85  "/7- 89  "/8- 89. The evening runs anywhere from 116-278. Also she stated that the Linzess is not working, she is also giving him stool softener but not really getting any relief. Requesting md to rx something or recommend what else to do for the constipation...Johny Chess

## 2014-08-20 ENCOUNTER — Ambulatory Visit: Payer: Medicare Other | Admitting: Internal Medicine

## 2014-08-20 ENCOUNTER — Other Ambulatory Visit (INDEPENDENT_AMBULATORY_CARE_PROVIDER_SITE_OTHER): Payer: Commercial Managed Care - HMO

## 2014-08-20 DIAGNOSIS — E785 Hyperlipidemia, unspecified: Secondary | ICD-10-CM | POA: Diagnosis not present

## 2014-08-20 LAB — LIPID PANEL
CHOL/HDL RATIO: 3
Cholesterol: 180 mg/dL (ref 0–200)
HDL: 54.9 mg/dL (ref >39.00)
LDL Cholesterol: 105 mg/dL — ABNORMAL HIGH (ref 0–99)
NonHDL: 125.1
TRIGLYCERIDES: 102 mg/dL (ref 0.0–149.0)
VLDL: 20.4 mg/dL (ref 0.0–40.0)

## 2014-08-20 LAB — HEPATIC FUNCTION PANEL
ALK PHOS: 65 U/L (ref 39–117)
ALT: 27 U/L (ref 0–53)
AST: 20 U/L (ref 0–37)
Albumin: 3.4 g/dL — ABNORMAL LOW (ref 3.5–5.2)
Bilirubin, Direct: 0 mg/dL (ref 0.0–0.3)
TOTAL PROTEIN: 6.4 g/dL (ref 6.0–8.3)
Total Bilirubin: 0.4 mg/dL (ref 0.2–1.2)

## 2014-08-20 LAB — CK: Total CK: 53 U/L (ref 7–232)

## 2014-08-21 ENCOUNTER — Telehealth: Payer: Self-pay

## 2014-08-21 NOTE — Telephone Encounter (Signed)
I spoke to patients wife and she states results are not being checked through my chart

## 2014-08-21 NOTE — Telephone Encounter (Signed)
Please mail results;thanks, [employer]

## 2014-08-21 NOTE — Telephone Encounter (Signed)
Results from 08/20/14 have been mailed

## 2014-08-21 NOTE — Telephone Encounter (Signed)
-----   Message from Hendricks Limes, MD sent at 08/21/2014  7:07 AM EST ----- Please verify that results are being checked through My Chart. If  not using My Chart; results will be mailed & My Chart should be closed.

## 2014-08-23 ENCOUNTER — Other Ambulatory Visit: Payer: Self-pay | Admitting: Internal Medicine

## 2014-08-23 ENCOUNTER — Ambulatory Visit (INDEPENDENT_AMBULATORY_CARE_PROVIDER_SITE_OTHER): Payer: Commercial Managed Care - HMO | Admitting: Internal Medicine

## 2014-08-23 VITALS — BP 100/60 | HR 60 | Temp 97.7°F | Resp 16 | Ht 66.0 in

## 2014-08-23 DIAGNOSIS — E118 Type 2 diabetes mellitus with unspecified complications: Secondary | ICD-10-CM

## 2014-08-23 DIAGNOSIS — C61 Malignant neoplasm of prostate: Secondary | ICD-10-CM

## 2014-08-23 DIAGNOSIS — N184 Chronic kidney disease, stage 4 (severe): Secondary | ICD-10-CM

## 2014-08-23 DIAGNOSIS — F0391 Unspecified dementia with behavioral disturbance: Secondary | ICD-10-CM

## 2014-08-23 DIAGNOSIS — G609 Hereditary and idiopathic neuropathy, unspecified: Secondary | ICD-10-CM

## 2014-08-23 NOTE — Progress Notes (Signed)
Pre visit review using our clinic review tool, if applicable. No additional management support is needed unless otherwise documented below in the visit note. 

## 2014-08-23 NOTE — Patient Instructions (Signed)

## 2014-08-23 NOTE — Progress Notes (Signed)
Subjective:    Patient ID: Shane [name], male    DOB: June 08, 1917, 81 y.o.   MRN: [id number]  Diabetes He presents for his follow-up diabetic visit. He has type 2 diabetes mellitus. His disease course has been fluctuating. Hypoglycemia symptoms include confusion and dizziness. There are no diabetic associated symptoms. Pertinent negatives for diabetes include no blurred vision, no chest pain, no fatigue, no foot paresthesias, no foot ulcerations, no polydipsia, no polyphagia, no polyuria, no visual change, no weakness and no weight loss. Symptoms are worsening. Current diabetic treatment includes intensive insulin program and insulin injections. He is compliant with treatment most of the time. His weight is stable. He is following a generally healthy diet. Meal planning includes avoidance of concentrated sweets. He never participates in exercise. His home blood glucose trend is decreasing steadily. His breakfast blood glucose range is generally <70 mg/dl. His lunch blood glucose range is generally 90-110 mg/dl. His dinner blood glucose range is generally 110-130 mg/dl. His highest blood glucose is 110-130 mg/dl. His overall blood glucose range is 110-130 mg/dl. An ACE inhibitor/angiotensin II receptor blocker is not being taken. He does not see a podiatrist.Eye exam is current.      Review of Systems  Constitutional: Negative.  Negative for fever, chills, weight loss, diaphoresis, appetite change and fatigue.  HENT: Negative.   Eyes: Negative.  Negative for blurred vision.  Respiratory: Negative.  Negative for cough, choking, chest tightness, shortness of breath and stridor.   Cardiovascular: Negative.  Negative for chest pain, palpitations and leg swelling.  Gastrointestinal: Negative.  Negative for nausea, vomiting, abdominal pain, diarrhea, constipation and blood in stool.  Endocrine: Negative.  Negative for polydipsia, polyphagia and polyuria.  Genitourinary: Negative.   Musculoskeletal:  Positive for back pain, arthralgias and neck pain. Negative for myalgias, joint swelling, gait problem and neck stiffness.  Skin: Negative.   Allergic/Immunologic: Negative.   Neurological: Positive for dizziness. Negative for weakness.  Hematological: Negative.  Negative for adenopathy. Does not bruise/bleed easily.  Psychiatric/Behavioral: Positive for confusion.       Objective:   Physical Exam  Constitutional: He is oriented to person, place, and time. He appears well-developed and well-nourished.  Non-toxic appearance. He has a sickly appearance. He does not appear ill. No distress.  He is wheelchair bound  HENT:  Head: Normocephalic and atraumatic.  Mouth/Throat: Oropharynx is clear and moist. No oropharyngeal exudate.  Eyes: Conjunctivae are normal. Right eye exhibits no discharge. Left eye exhibits no discharge. No scleral icterus.  Neck: Normal range of motion. Neck supple. No JVD present. No tracheal deviation present. No thyromegaly present.  Cardiovascular: Normal rate, regular rhythm, normal heart sounds and intact distal pulses.  Exam reveals no gallop and no friction rub.   No murmur heard. Pulmonary/Chest: Effort normal and breath sounds normal. No stridor. No respiratory distress. He has no wheezes. He has no rales. He exhibits no tenderness.  Abdominal: Soft. Bowel sounds are normal. He exhibits no distension and no mass. There is no tenderness. There is no rebound and no guarding.  Musculoskeletal: He exhibits edema (trace edema in BLE).  Lymphadenopathy:    He has no cervical adenopathy.  Neurological: He is oriented to person, place, and time.  Skin: Skin is warm and dry. No rash noted. He is not diaphoretic. No erythema. No pallor.  Nursing note and vitals reviewed.    Results for Shane, [name] (MRN [id number]) as of 08/23/2014 10:10  Ref. Range 08/20/2014 10:15  Alkaline Phosphatase Latest Range: 39-117  U/L 65  Albumin Latest Range: 3.5-5.2 g/dL 3.4 (L)  AST  Latest Range: 0-37 U/L 20  ALT Latest Range: 0-53 U/L 27  Total Protein Latest Range: 6.0-8.3 g/dL 6.4  Bilirubin, Direct Latest Range: 0.0-0.3 mg/dL 0.0  Total Bilirubin Latest Range: 0.2-1.2 mg/dL 0.4  CK Total Latest Range: 7-232 U/L 53  Cholesterol Latest Range: 0-200 mg/dL 180  Triglycerides Latest Range: 0.0-149.0 mg/dL 102.0  HDL Latest Range: >39.00 mg/dL 54.90  LDL (calc) Latest Range: 0-99 mg/dL 105 (H)  VLDL Latest Range: 0.0-40.0 mg/dL 20.4  Total CHOL/HDL Ratio No range found 3  NonHDL No range found 125.10   Lab Results  Component Value Date   WBC 6.9 06/08/2014   HGB 10.2* 06/08/2014   HCT 32.1* 06/08/2014   PLT 168 06/08/2014   GLUCOSE 89 06/08/2014   CHOL 180 08/20/2014   TRIG 102.0 08/20/2014   HDL 54.90 08/20/2014   LDLCALC 105* 08/20/2014   ALT 27 08/20/2014   AST 20 08/20/2014   NA 146 06/08/2014   K 5.1 06/08/2014   CL 111 06/08/2014   CREATININE 2.72* 06/08/2014   BUN 50* 06/08/2014   CO2 21 06/08/2014   TSH 1.47 04/19/2014   PSA 1.00 08/11/2013   INR 1.05 06/08/2014   HGBA1C 6.9* 06/09/2014   MICROALBUR 15.3* 11/09/2007       Assessment & Plan:

## 2014-08-27 ENCOUNTER — Encounter: Payer: Self-pay | Admitting: Internal Medicine

## 2014-08-27 NOTE — Assessment & Plan Note (Signed)
The family is concerned about some blood sugars drifting down into the 60's so I have asked them to stop giving him the lantus Will cont novolog as currently directed

## 2014-08-27 NOTE — Assessment & Plan Note (Signed)
His renal function is stable.  Will avoid nephrotoxic agents. 

## 2014-09-03 ENCOUNTER — Telehealth: Payer: Self-pay | Admitting: *Deleted

## 2014-09-03 ENCOUNTER — Other Ambulatory Visit: Payer: Self-pay

## 2014-09-03 DIAGNOSIS — I10 Essential (primary) hypertension: Secondary | ICD-10-CM

## 2014-09-03 DIAGNOSIS — N183 Chronic kidney disease, stage 3 unspecified: Secondary | ICD-10-CM

## 2014-09-03 DIAGNOSIS — N184 Chronic kidney disease, stage 4 (severe): Secondary | ICD-10-CM

## 2014-09-03 NOTE — Telephone Encounter (Signed)
-----   Message from Carlynn Purl sent at 09/03/2014 11:40 AM EST ----- Hospice referral.  Thanks Lorre Nick

## 2014-09-03 NOTE — Telephone Encounter (Signed)
Notified [facility] spoke with [facility] gave referral information for Hospice...Shane [name]

## 2014-09-05 ENCOUNTER — Other Ambulatory Visit: Payer: Self-pay

## 2014-09-05 MED ORDER — TAMSULOSIN HCL 0.4 MG PO CAPS: 0.4000 mg | ORAL_CAPSULE | Freq: Two times a day (BID) | ORAL | Status: DC

## 2014-09-05 MED ORDER — ALCOHOL SWABS PADS: MEDICATED_PAD | Status: DC

## 2014-09-05 MED ORDER — NIFEDIPINE ER OSMOTIC RELEASE 30 MG PO TB24: 30.0000 mg | ORAL_TABLET | Freq: Two times a day (BID) | ORAL | Status: DC

## 2014-09-05 MED ORDER — INSULIN ASPART 100 UNIT/ML FLEXPEN: PEN_INJECTOR | SUBCUTANEOUS | Status: DC

## 2014-09-07 ENCOUNTER — Other Ambulatory Visit: Payer: Self-pay

## 2014-09-07 DIAGNOSIS — I10 Essential (primary) hypertension: Secondary | ICD-10-CM

## 2014-09-07 DIAGNOSIS — N183 Chronic kidney disease, stage 3 unspecified: Secondary | ICD-10-CM

## 2014-09-07 MED ORDER — PREDNISONE 10 MG PO TABS: 10.0000 mg | ORAL_TABLET | Freq: Every day | ORAL | Status: DC

## 2014-09-07 MED ORDER — INSULIN ASPART 100 UNIT/ML FLEXPEN: PEN_INJECTOR | SUBCUTANEOUS | Status: AC

## 2014-09-07 MED ORDER — NIFEDIPINE ER OSMOTIC RELEASE 30 MG PO TB24: 30.0000 mg | ORAL_TABLET | Freq: Two times a day (BID) | ORAL | Status: DC

## 2014-09-07 MED ORDER — ALCOHOL SWABS PADS: MEDICATED_PAD | Status: DC

## 2014-09-07 MED ORDER — FUROSEMIDE 20 MG PO TABS: 20.0000 mg | ORAL_TABLET | Freq: Every day | ORAL | Status: AC

## 2014-09-07 MED ORDER — POLYETHYLENE GLYCOL 3350 17 GM/SCOOP PO POWD: 1.0000 | Freq: Once | ORAL | Status: DC

## 2014-09-07 MED ORDER — TAMSULOSIN HCL 0.4 MG PO CAPS: 0.4000 mg | ORAL_CAPSULE | Freq: Two times a day (BID) | ORAL | Status: AC

## 2014-10-03 ENCOUNTER — Telehealth: Payer: Self-pay | Admitting: Internal Medicine

## 2014-10-03 MED ORDER — POLYETHYLENE GLYCOL 3350 17 GM/SCOOP PO POWD: 1.0000 | Freq: Once | ORAL | Status: AC

## 2014-10-03 NOTE — Telephone Encounter (Signed)
Done

## 2014-10-03 NOTE — Telephone Encounter (Signed)
Pt daughter in law called in and wanted to know if polyethylene glycol powder (MIRALAX) powder could be called in to pharmacy ([facility] [address])  Also going forward pt will get all meds though [employer] order

## 2014-11-27 ENCOUNTER — Encounter: Payer: Self-pay | Admitting: Internal Medicine

## 2014-11-27 ENCOUNTER — Other Ambulatory Visit (INDEPENDENT_AMBULATORY_CARE_PROVIDER_SITE_OTHER): Payer: Commercial Managed Care - HMO

## 2014-11-27 ENCOUNTER — Ambulatory Visit (INDEPENDENT_AMBULATORY_CARE_PROVIDER_SITE_OTHER): Payer: Commercial Managed Care - HMO | Admitting: Internal Medicine

## 2014-11-27 VITALS — BP 110/68 | HR 63 | Temp 98.3°F | Resp 16 | Ht 66.0 in

## 2014-11-27 DIAGNOSIS — N184 Chronic kidney disease, stage 4 (severe): Secondary | ICD-10-CM

## 2014-11-27 DIAGNOSIS — E118 Type 2 diabetes mellitus with unspecified complications: Secondary | ICD-10-CM

## 2014-11-27 DIAGNOSIS — I10 Essential (primary) hypertension: Secondary | ICD-10-CM | POA: Diagnosis not present

## 2014-11-27 LAB — BASIC METABOLIC PANEL
BUN: 52 mg/dL — ABNORMAL HIGH (ref 6–23)
CALCIUM: 9 mg/dL (ref 8.4–10.5)
CHLORIDE: 106 meq/L (ref 96–112)
CO2: 26 mEq/L (ref 19–32)
CREATININE: 2.82 mg/dL — AB (ref 0.40–1.50)
GFR: 26.92 mL/min — ABNORMAL LOW (ref >60.00)
Glucose, Bld: 183 mg/dL — ABNORMAL HIGH (ref 70–99)
Potassium: 5.5 mEq/L — ABNORMAL HIGH (ref 3.5–5.1)
Sodium: 139 mEq/L (ref 135–145)

## 2014-11-27 LAB — HEMOGLOBIN A1C: Hgb A1c MFr Bld: 7 % — ABNORMAL HIGH (ref 4.6–6.5)

## 2014-11-27 MED ORDER — INSULIN DEGLUDEC 100 UNIT/ML ~~LOC~~ SOPN: 20.0000 [IU] | PEN_INJECTOR | Freq: Every day | SUBCUTANEOUS | Status: AC

## 2014-11-27 NOTE — Progress Notes (Signed)
Subjective:  Patient ID: Shane [name], male    DOB: 06-Aug-1916  Age: 81 y.o. MRN: [id number]  CC: Hypertension and Diabetes   HPI Gloyd Happ presents for his daughter complains that his blood sugar spikes up into the 200's rather frequently. He is also complains of frequent urination.  Outpatient Prescriptions Prior to Visit  Medication Sig Dispense Refill  . Alcohol Swabs PADS Use swab to clean area where lancet will penetrate skin. 240 each 1  . aspirin EC 325 MG tablet Take 1 tablet (325 mg total) by mouth daily. 30 tablet 0  . atorvastatin (LIPITOR) 10 MG tablet Take 1 tablet (10 mg total) by mouth daily. 30 tablet 0  . ferrous sulfate 325 (65 FE) MG tablet Take 1 tablet (325 mg total) by mouth daily. 90 tablet 2  . furosemide (LASIX) 20 MG tablet Take 1 tablet (20 mg total) by mouth daily. 90 tablet 2  . hydrALAZINE (APRESOLINE) 50 MG tablet Take 1 tablet (50 mg total) by mouth 3 (three) times daily. 90 tablet 11  . insulin aspart (NOVOLOG FLEXPEN) 100 UNIT/ML FlexPen INJECT 5 UNITS SUBCUTANEOUSLY WITH EACH MEAL AND AN EXTRA 5 UNITS FOR BLOOD SUGAR> 150 45 mL 3  . isosorbide mononitrate (IMDUR) 30 MG 24 hr tablet TAKE ONE TABLET BY MOUTH ONE TIME DAILY (Patient taking differently: Take 30 mg by mouth daily. ) 90 tablet 2  . meclizine (ANTIVERT) 25 MG tablet Take 25 mg by mouth 3 (three) times daily as needed for dizziness.    . Multiple Vitamins-Minerals (MENS MULTIVITAMIN PLUS PO) Take 1 tablet by mouth daily.    [name] NIFEdipine (PROCARDIA-XL/ADALAT-CC/NIFEDICAL-XL) 30 MG 24 hr tablet Take 1 tablet (30 mg total) by mouth 2 (two) times daily. 180 tablet 2  . polyethylene glycol powder (MIRALAX) powder Take 255 g by mouth once. 255 g 2  . predniSONE (DELTASONE) 10 MG tablet Take 1 tablet (10 mg total) by mouth daily with breakfast. 90 tablet 0  . tamsulosin (FLOMAX) 0.4 MG CAPS capsule Take 1 capsule (0.4 mg total) by mouth 2 (two) times daily. 180 capsule 2  . vitamin B-12  (CYANOCOBALAMIN) 500 MCG tablet Take 500 mcg by mouth daily.      . vitamin C (ASCORBIC ACID) 500 MG tablet Take 500 mg by mouth daily.     No facility-administered medications prior to visit.    ROS Review of Systems  Constitutional: Negative for fever, chills, appetite change and fatigue.  HENT: Negative.   Eyes: Negative.   Respiratory: Negative.  Negative for cough, choking, chest tightness, shortness of breath and stridor.   Cardiovascular: Negative.  Negative for chest pain, palpitations and leg swelling.  Gastrointestinal: Negative.  Negative for nausea, vomiting, abdominal pain, diarrhea, constipation and blood in stool.  Endocrine: Positive for polyuria. Negative for polydipsia and polyphagia.  Genitourinary: Negative.  Negative for dysuria, urgency, frequency, hematuria, flank pain, decreased urine volume and difficulty urinating.  Musculoskeletal: Negative.   Skin: Negative.   Allergic/Immunologic: Negative.   Neurological: Negative.   Hematological: Negative.   Psychiatric/Behavioral: Negative.     Objective:  BP 110/68 mmHg  Pulse 63  Temp(Src) 98.3 F (36.8 C) (Oral)  Resp 16  Ht 5\' 6"  (1.676 m)  Wt   SpO2 96%  BP Readings from Last 3 Encounters:  11/27/14 110/68  08/23/14 100/60  06/19/14 138/62    Wt Readings from Last 3 Encounters:  05/30/14 163 lb 6.4 oz (74.118 kg)  04/19/14 165 lb (74.844 kg)  02/16/14 146 lb (66.225 kg)    Physical Exam  Constitutional: He is oriented to person, place, and time. He appears well-developed and well-nourished. No distress.  HENT:  Head: Normocephalic and atraumatic.  Mouth/Throat: Oropharynx is clear and moist. No oropharyngeal exudate.  Eyes: Conjunctivae are normal. Right eye exhibits no discharge. Left eye exhibits no discharge. No scleral icterus.  Neck: Normal range of motion. Neck supple. No JVD present. No tracheal deviation present. No thyromegaly present.  Cardiovascular: Normal rate, regular rhythm,  normal heart sounds and intact distal pulses.  Exam reveals no gallop and no friction rub.   No murmur heard. Pulmonary/Chest: Effort normal and breath sounds normal. No stridor. No respiratory distress. He has no wheezes. He has no rales. He exhibits no tenderness.  Abdominal: Soft. Bowel sounds are normal. He exhibits no distension and no mass. There is no tenderness. There is no rebound and no guarding.  Musculoskeletal: Normal range of motion. He exhibits no edema or tenderness.  Lymphadenopathy:    He has no cervical adenopathy.  Neurological: He is oriented to person, place, and time.  Skin: Skin is warm and dry. No rash noted. He is not diaphoretic. No erythema. No pallor.  Psychiatric: He has a normal mood and affect. Judgment and thought content normal. His mood appears not anxious. His affect is not angry, not blunt, not labile and not inappropriate. His speech is delayed and tangential. He is slowed and withdrawn. Cognition and memory are normal. He does not exhibit a depressed mood. He is inattentive.  Vitals reviewed.   Lab Results  Component Value Date   WBC 6.9 06/08/2014   HGB 10.2* 06/08/2014   HCT 32.1* 06/08/2014   PLT 168 06/08/2014   GLUCOSE 183* 11/27/2014   CHOL 180 08/20/2014   TRIG 102.0 08/20/2014   HDL 54.90 08/20/2014   LDLCALC 105* 08/20/2014   ALT 27 08/20/2014   AST 20 08/20/2014   NA 139 11/27/2014   K 5.5* 11/27/2014   CL 106 11/27/2014   CREATININE 2.82* 11/27/2014   BUN 52* 11/27/2014   CO2 26 11/27/2014   TSH 1.47 04/19/2014   PSA 1.00 08/11/2013   INR 1.05 06/08/2014   HGBA1C 7.0* 11/27/2014   MICROALBUR 15.3* 11/09/2007   Results for HARLYN, ITALIANO (MRN [id number]) as of 11/27/2014 16:42  Ref. Range 04/19/2014 11:52  GFR Latest Ref Range: >60.00 mL/min 32.78 (L)   Ct Head Wo Contrast  06/08/2014   CLINICAL DATA:  Awoke this morning with garbled speech, hard understand, somnolence, not alert as normal, possible LEFT side facial droop,  history Alzheimer's dementia, short-term memory loss, hypertension, diabetes, CHF  EXAM: CT HEAD WITHOUT CONTRAST  TECHNIQUE: Contiguous axial images were obtained from the base of the skull through the vertex without intravenous contrast.  COMPARISON:  01/01/2014  FINDINGS: Generalized atrophy.  Ex vacuo dilatation of the ventricular system.  No midline shift or mass effect.  Small vessel chronic ischemic changes of deep cerebral white matter.  No intracranial hemorrhage, mass lesion, or evidence of the acute infarction.  No extra-axial fluid collections.  Chronic sinusitis involving the RIGHT sphenoid sinus with osseous thickening and sclerosis.  Atherosclerotic calcifications of the carotid siphons bilaterally.  No new bone or sinus abnormality.  IMPRESSION: Generalized atrophy with small vessel chronic ischemic changes of deep cerebral white matter.  No acute intracranial abnormalities.   Electronically Signed   By: Lavonia Dana M.D.   On: 06/08/2014 13:10   Mr Brain Wo Contrast  06/08/2014  CLINICAL DATA:  TIA. Slurred speech, facial droop, drooling, altered mental status  EXAM: MRI HEAD WITHOUT CONTRAST  MRA HEAD WITHOUT CONTRAST  TECHNIQUE: Multiplanar, multiecho pulse sequences of the brain and surrounding structures were obtained without intravenous contrast. Angiographic images of the head were obtained using MRA technique without contrast.  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 Extensive soft tissue thickening around the dens with mild spinal stenosis. This is seen on prior studies.  Moderate atrophy.  Ventricular enlargement consistent with atrophy.  Negative for acute infarct. Chronic microvascular ischemic change in the white matter. Brainstem and cerebellum intact.  Negative for intracranial hemorrhage.  Negative for mass or edema.  No shift of the midline structures.  Mucosal edema in the paranasal  sinuses most notably right sphenoid sinus which is completely opacified.  MRA HEAD FINDINGS  Both vertebral arteries patent to the basilar. Focal loss of signal in the left vertebral artery at the level of the occipital bone most likely artifact. Small basilar artery due to fetal origin of the posterior cerebral artery. Basilar is patent and ends in superior cerebellar artery. Both posterior cerebral arteries are patent.  Atherosclerotic irregularity and mild stenosis in the cavernous carotid bilaterally. Anterior and middle cerebral artery is patent bilaterally.  Negative for cerebral aneurysm.  Image quality degraded by motion.  IMPRESSION: Chronic fracture the dens with nonunion  Atrophy and chronic microvascular ischemia. No acute intracranial abnormality.  No significant intracranial arterial stenosis.   Electronically Signed   By: Franchot Gallo M.D.   On: 06/08/2014 19:02   Mr Jodene Nam Head/brain Wo Cm  06/08/2014   CLINICAL DATA:  TIA. Slurred speech, facial droop, drooling, altered mental status  EXAM: MRI HEAD WITHOUT CONTRAST  MRA HEAD WITHOUT CONTRAST  TECHNIQUE: Multiplanar, multiecho pulse sequences of the brain and surrounding structures were obtained without intravenous contrast. Angiographic images of the head were obtained using MRA technique without contrast.  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 Extensive soft tissue thickening around the dens with mild spinal stenosis. This is seen on prior studies.  Moderate atrophy.  Ventricular enlargement consistent with atrophy.  Negative for acute infarct. Chronic microvascular ischemic change in the white matter. Brainstem and cerebellum intact.  Negative for intracranial hemorrhage.  Negative for mass or edema.  No shift of the midline structures.  Mucosal edema in the paranasal sinuses most notably right sphenoid sinus which is completely opacified.  MRA  HEAD FINDINGS  Both vertebral arteries patent to the basilar. Focal loss of signal in the left vertebral artery at the level of the occipital bone most likely artifact. Small basilar artery due to fetal origin of the posterior cerebral artery. Basilar is patent and ends in superior cerebellar artery. Both posterior cerebral arteries are patent.  Atherosclerotic irregularity and mild stenosis in the cavernous carotid bilaterally. Anterior and middle cerebral artery is patent bilaterally.  Negative for cerebral aneurysm.  Image quality degraded by motion.  IMPRESSION: Chronic fracture the dens with nonunion  Atrophy and chronic microvascular ischemia. No acute intracranial abnormality.  No significant intracranial arterial stenosis.   Electronically Signed   By: Franchot Gallo M.D.   On: 06/08/2014 19:02    Assessment & Plan:   Posey was seen today for hypertension and diabetes.  Diagnoses and all orders for this visit:  Essential hypertension  Orders: -     Basic metabolic panel; Future  Diabetes mellitus type 2, controlled, with complications Orders: -     Basic metabolic panel; Future -     Hemoglobin A1c; Future -     Insulin Degludec (TRESIBA FLEXTOUCH) 100 UNIT/ML SOPN; Inject 20 Units into the skin daily.  CKD (chronic kidney disease) stage 4, GFR 15-29 ml/min Orders: -     Basic metabolic panel; Future  comments: His A1C is 7% Will add a basal insulin to control the blood sugar spikes  I am having Mr. Goodine start on Insulin Degludec. I am also having him maintain his vitamin B-12, meclizine, Multiple Vitamins-Minerals (MENS MULTIVITAMIN PLUS PO), vitamin C, ferrous sulfate, isosorbide mononitrate, hydrALAZINE, aspirin EC, atorvastatin, Alcohol Swabs, insulin aspart, predniSONE, furosemide, tamsulosin, NIFEdipine, and polyethylene glycol powder.  Meds ordered this encounter  Medications  . Insulin Degludec (TRESIBA FLEXTOUCH) 100 UNIT/ML SOPN    Sig: Inject 20 Units into the  skin daily.    Dispense:  3 mL    Refill:  11     Follow-up: Return in about 4 months (around 03/30/2015).  [name], MD

## 2014-11-27 NOTE — Patient Instructions (Signed)

## 2014-11-27 NOTE — Progress Notes (Signed)
Pre visit review using our clinic review tool, if applicable. No additional management support is needed unless otherwise documented below in the visit note. 

## 2014-11-27 NOTE — Progress Notes (Signed)
Patient unable to weigh -wheelchair bound

## 2014-11-30 ENCOUNTER — Other Ambulatory Visit: Payer: Self-pay

## 2014-11-30 DIAGNOSIS — I10 Essential (primary) hypertension: Secondary | ICD-10-CM

## 2014-11-30 DIAGNOSIS — N184 Chronic kidney disease, stage 4 (severe): Secondary | ICD-10-CM

## 2014-11-30 MED ORDER — HYDRALAZINE HCL 50 MG PO TABS: 50.0000 mg | ORAL_TABLET | Freq: Three times a day (TID) | ORAL | Status: AC

## 2014-11-30 MED ORDER — ISOSORBIDE MONONITRATE ER 30 MG PO TB24: ORAL_TABLET | ORAL | Status: AC

## 2014-11-30 MED ORDER — NIFEDIPINE ER OSMOTIC RELEASE 30 MG PO TB24: 30.0000 mg | ORAL_TABLET | Freq: Two times a day (BID) | ORAL | Status: AC

## 2014-12-11 ENCOUNTER — Other Ambulatory Visit: Payer: Self-pay

## 2014-12-11 MED ORDER — ALCOHOL SWABS PADS: MEDICATED_PAD | Status: AC

## 2014-12-11 MED ORDER — TRUEPLUS LANCETS 33G MISC: Status: AC

## 2014-12-11 MED ORDER — TRUE METRIX AIR GLUCOSE METER W/DEVICE KIT: 1.0000 | PACK | Freq: Three times a day (TID) | Status: AC

## 2014-12-11 MED ORDER — GLUCOSE BLOOD VI STRP: ORAL_STRIP | Status: AC

## 2015-01-04 ENCOUNTER — Other Ambulatory Visit: Payer: Self-pay | Admitting: Internal Medicine

## 2015-01-15 ENCOUNTER — Telehealth: Payer: Self-pay | Admitting: Internal Medicine

## 2015-01-15 NOTE — Telephone Encounter (Signed)
FYI - patient passed away last night, 02/11/15, at 10:00pm at his home.

## 2015-01-17 ENCOUNTER — Telehealth: Payer: Self-pay

## 2015-01-17 NOTE — Telephone Encounter (Signed)
On 01/17/2015 I received a death certificate from [employer]. The death certificate is for burial. The patient is a patient of [name]. The death certificate will be taken to the Primary Care Unit for signature this afternoon.  Received back 01-18-15 and called for pick-up.

## 2015-02-11 DEATH — deceased

## 2015-06-05 ENCOUNTER — Encounter (HOSPITAL_COMMUNITY): Payer: Commercial Managed Care - HMO

## 2015-06-05 ENCOUNTER — Ambulatory Visit: Payer: Medicare Other | Admitting: Vascular Surgery

## 2016-06-18 IMAGING — CT CT CERVICAL SPINE W/O CM
4 of 5 series · 17 of 33 positions shown, 19 images · non-contrast
Comparison: 01/01/2014

CLINICAL DATA: C2 cervical fracture.  Fall in [DATE].

EXAM:
CT CERVICAL SPINE WITHOUT CONTRAST
TECHNIQUE: Multidetector CT imaging of the cervical spine was performed without
intravenous contrast. Multiplanar CT image reconstructions were also
generated.

[Series 3: c spine bone · axial · 0.32mm/px · z∈[-4,+116]mm · 5 of 74 slices shown, 7 images]
[im 13/74  soft-tissue]
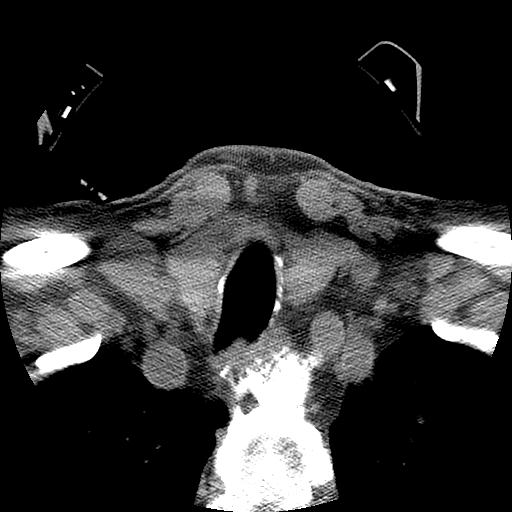
[im 13/74  bone]
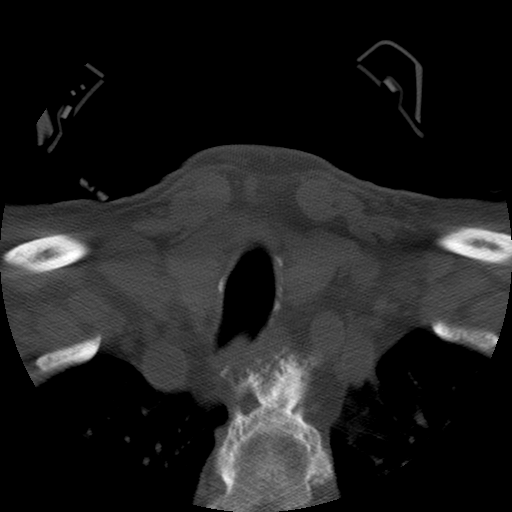
[im 25/74  bone]
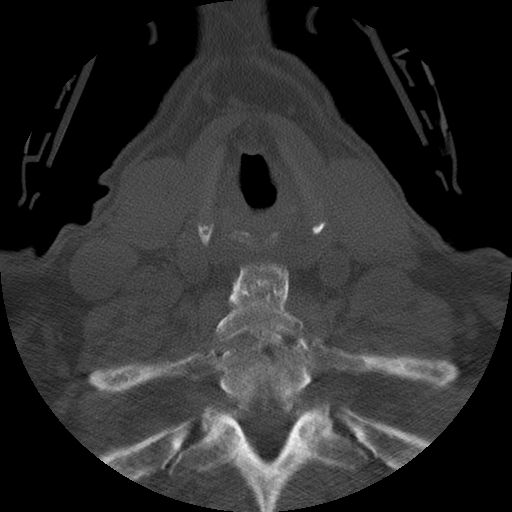
[im 37/74  bone]
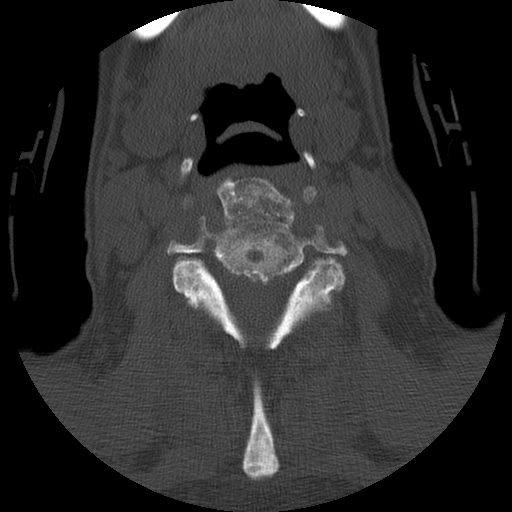
[im 49/74  bone]
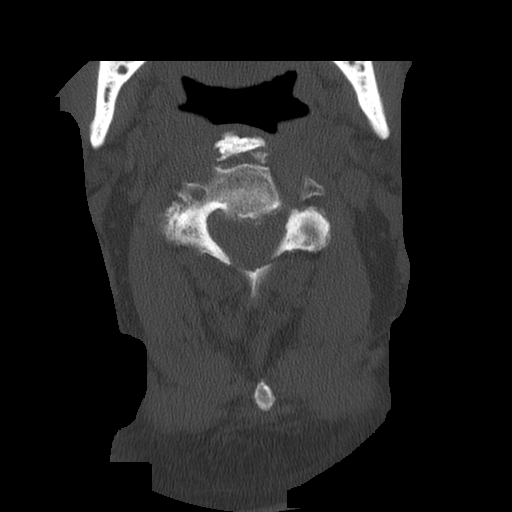
[im 61/74  soft-tissue]
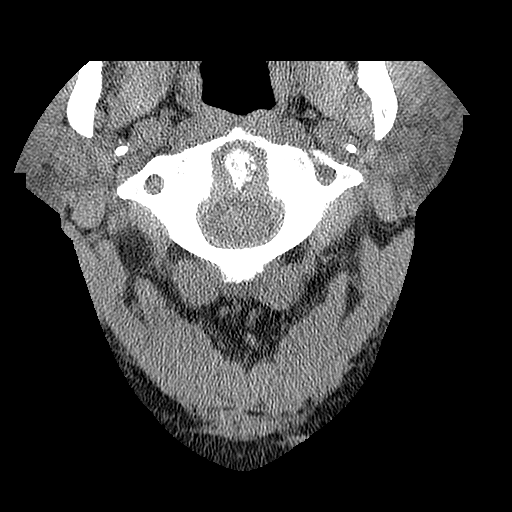
[im 61/74  bone]
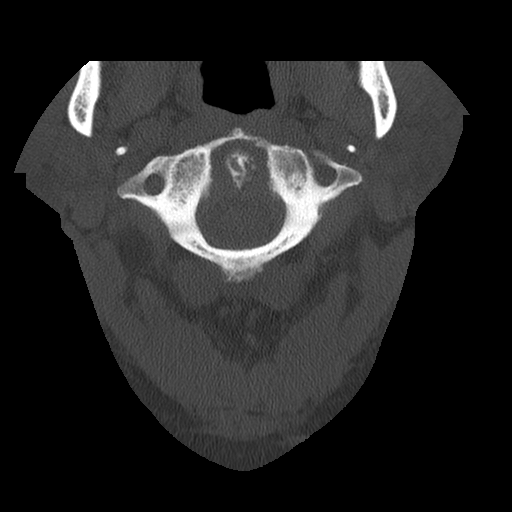

[Series 4: c spine soft · axial · 0.32mm/px · z∈[-7,+88]mm · 4 of 72 slices shown]
[im 12/72  soft-tissue]
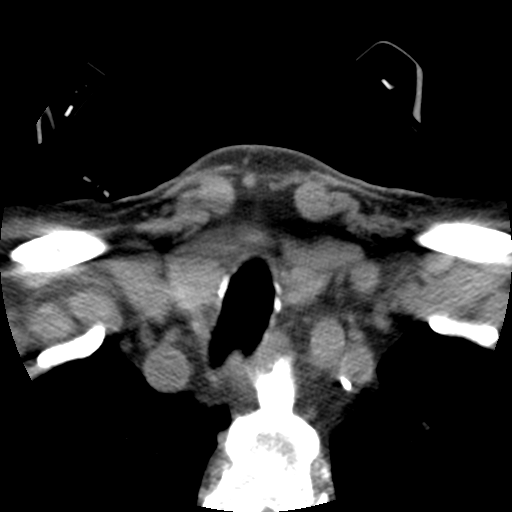
[im 24/72  soft-tissue]
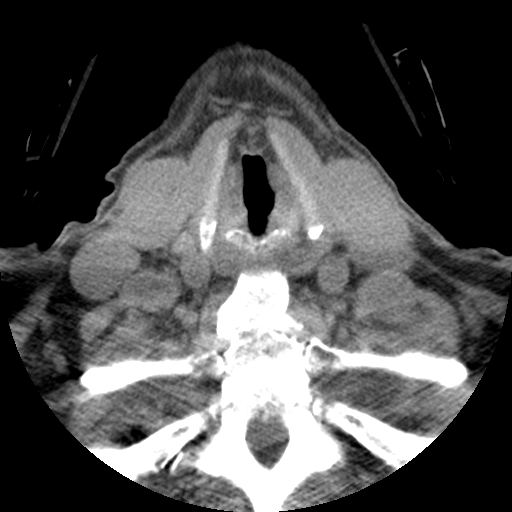
[im 36/72  soft-tissue]
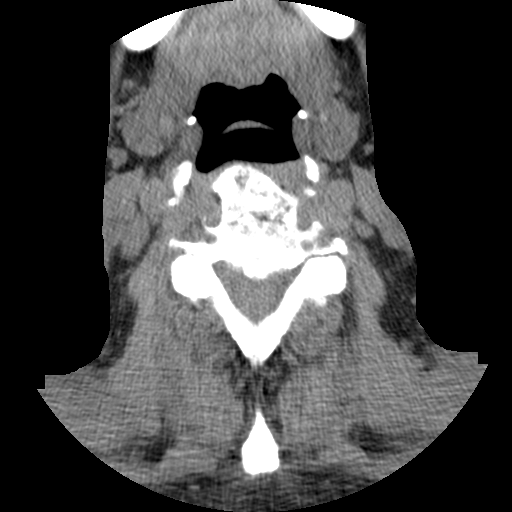
[im 48/72  soft-tissue]
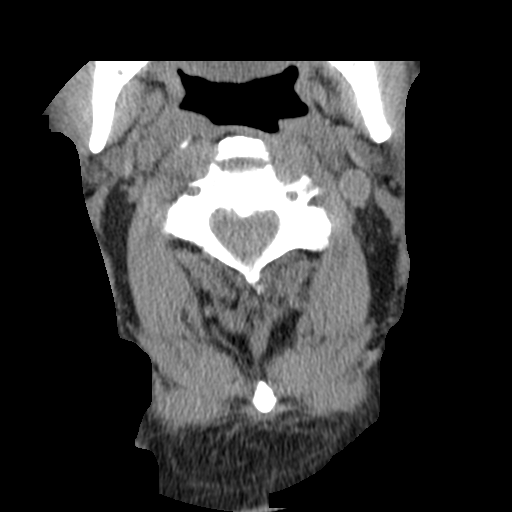

[Series 200: cor · coronal · 0.37mm/px · 3 of 53 slices shown]
[im 13/53  bone]
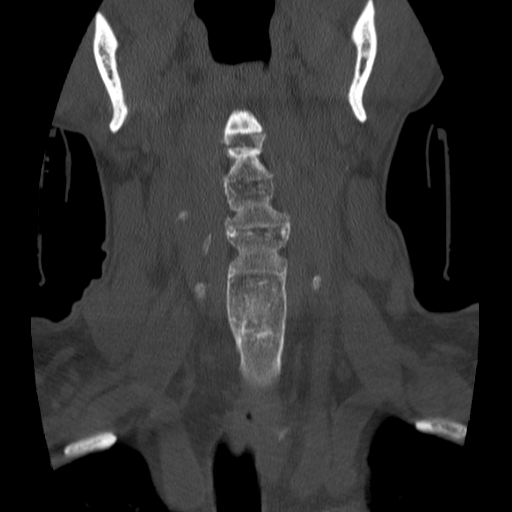
[im 22/53  bone]
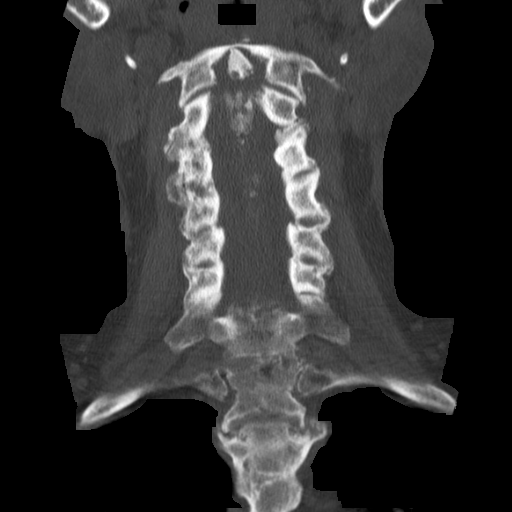
[im 31/53  bone]
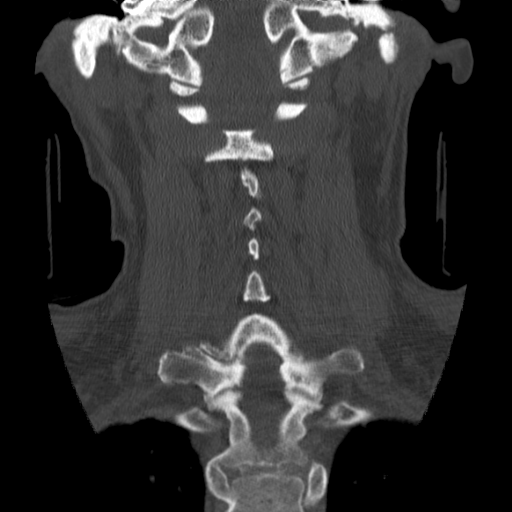

[Series 201: cor 2 · coronal · 0.37mm/px · 5 of 47 slices shown]
[im 8/47  bone]
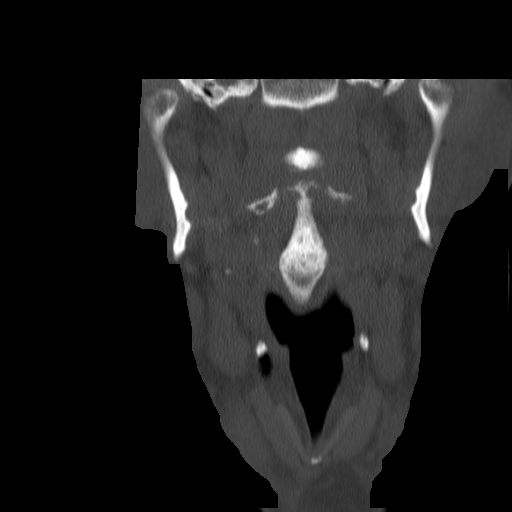
[im 16/47  bone]
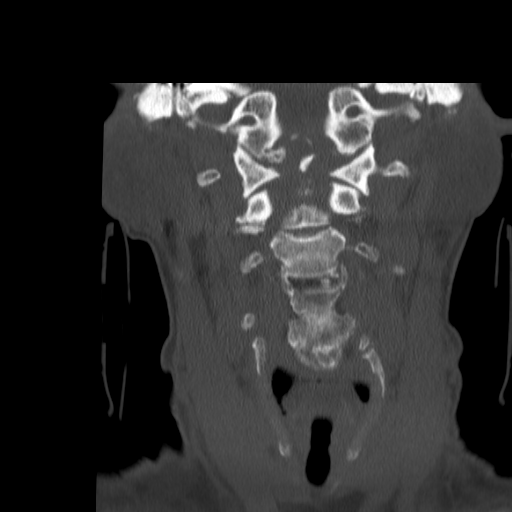
[im 24/47  bone]
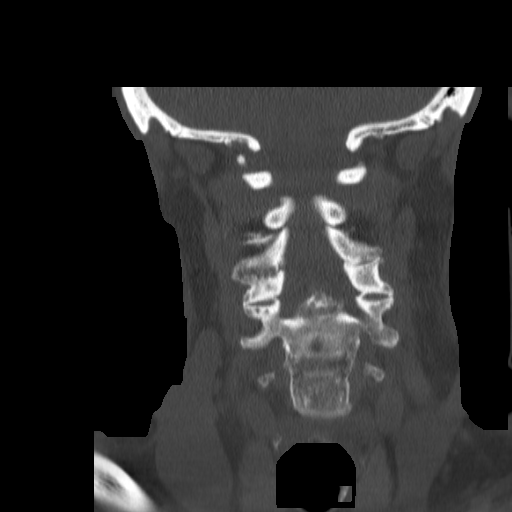
[im 31/47  bone]
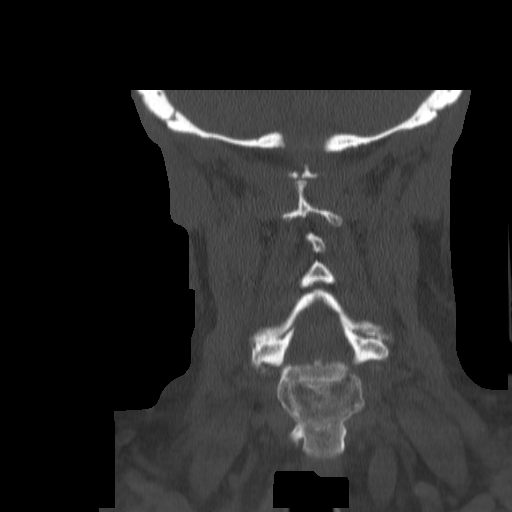
[im 39/47  bone]
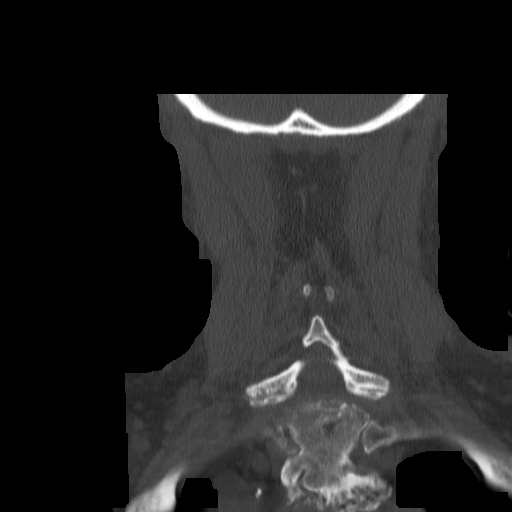

[17 of 33 positions shown; findings below may reference images not displayed]

FINDINGS: A type 2 dens fracture is again seen. Since the prior CT, there has
been osseous resorption at the fracture site along the inferior
margin of the dens fragment. No significant callus or bridging bone
is seen at the fracture site. Prominent pannus is again seen about
the dens. The lateral masses of C1 remain aligned with C2. No new
fracture is identified. Diffuse, solid bridging anterior osteophytes
are again seen throughout the cervical and visualized upper thoracic
spine. Chronic cortical break through the anterior osteophyte at the
T1-2 level is unchanged. Moderate to severe right-sided facet
arthrosis is present at C2-3, C3-4, and C7-T1, with evidence of
ankylosis at C3-4 and possibly C7-T1. Mild left greater than right
carotid bifurcation atherosclerotic calcification is noted.
IMPRESSION: 1. Progressive osseous resorption along the margins of the type 2
dens fracture. No bridging bone.
2. Diffuse bridging anterior osteophytes throughout the cervical and
upper thoracic spine, suggestive of DISH.
